# Patient Record
Sex: Female | Born: 1945 | Race: White | Hispanic: No | Marital: Married | Smoking: Never smoker
Health system: Southern US, Community
[De-identification: ages and names within clinical notes are randomized; demographics above are authoritative.]

## PROBLEM LIST (undated history)

## (undated) DIAGNOSIS — K529 Noninfective gastroenteritis and colitis, unspecified: Secondary | ICD-10-CM

## (undated) DIAGNOSIS — I499 Cardiac arrhythmia, unspecified: Secondary | ICD-10-CM

## (undated) DIAGNOSIS — M1990 Unspecified osteoarthritis, unspecified site: Secondary | ICD-10-CM

## (undated) DIAGNOSIS — K219 Gastro-esophageal reflux disease without esophagitis: Secondary | ICD-10-CM

## (undated) DIAGNOSIS — K5010 Crohn's disease of large intestine without complications: Secondary | ICD-10-CM

## (undated) DIAGNOSIS — I456 Pre-excitation syndrome: Secondary | ICD-10-CM

## (undated) HISTORY — PX: INGUINAL HERNIA REPAIR: SUR1180

## (undated) HISTORY — DX: Crohn's disease of large intestine without complications: K50.10

## (undated) HISTORY — PX: TUBAL LIGATION: SHX77

## (undated) HISTORY — DX: Pre-excitation syndrome: I45.6

---
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
## 1995-02-22 HISTORY — PX: BREAST BIOPSY: SHX20

## 1997-10-27 ENCOUNTER — Encounter: Admission: RE | Admit: 1997-10-27 | Discharge: 1997-10-27 | Payer: Self-pay | Admitting: Family Medicine

## 1998-04-09 ENCOUNTER — Encounter: Admission: RE | Admit: 1998-04-09 | Discharge: 1998-04-09 | Payer: Self-pay | Admitting: Sports Medicine

## 1998-06-11 ENCOUNTER — Encounter: Admission: RE | Admit: 1998-06-11 | Discharge: 1998-06-11 | Payer: Self-pay | Admitting: Sports Medicine

## 1998-07-06 ENCOUNTER — Encounter: Admission: RE | Admit: 1998-07-06 | Discharge: 1998-07-06 | Payer: Self-pay | Admitting: Sports Medicine

## 1998-11-02 ENCOUNTER — Encounter: Admission: RE | Admit: 1998-11-02 | Discharge: 1998-11-02 | Payer: Self-pay | Admitting: Family Medicine

## 1998-12-24 ENCOUNTER — Encounter: Admission: RE | Admit: 1998-12-24 | Discharge: 1998-12-24 | Payer: Self-pay | Admitting: Family Medicine

## 1999-03-07 ENCOUNTER — Other Ambulatory Visit: Admission: RE | Admit: 1999-03-07 | Discharge: 1999-03-07 | Payer: Self-pay | Admitting: Obstetrics and Gynecology

## 1999-10-16 ENCOUNTER — Ambulatory Visit (HOSPITAL_COMMUNITY): Admission: RE | Admit: 1999-10-16 | Discharge: 1999-10-16 | Payer: Self-pay | Admitting: Gastroenterology

## 2000-04-02 ENCOUNTER — Other Ambulatory Visit: Admission: RE | Admit: 2000-04-02 | Discharge: 2000-04-02 | Payer: Self-pay | Admitting: Obstetrics and Gynecology

## 2001-01-22 ENCOUNTER — Encounter: Admission: RE | Admit: 2001-01-22 | Discharge: 2001-01-22 | Payer: Self-pay | Admitting: Sports Medicine

## 2001-04-13 ENCOUNTER — Other Ambulatory Visit: Admission: RE | Admit: 2001-04-13 | Discharge: 2001-04-13 | Payer: Self-pay | Admitting: Obstetrics and Gynecology

## 2002-02-24 ENCOUNTER — Encounter: Admission: RE | Admit: 2002-02-24 | Discharge: 2002-02-24 | Payer: Self-pay | Admitting: Sports Medicine

## 2002-02-24 ENCOUNTER — Ambulatory Visit (HOSPITAL_COMMUNITY): Admission: RE | Admit: 2002-02-24 | Discharge: 2002-02-24 | Payer: Self-pay | Admitting: Sports Medicine

## 2003-05-21 ENCOUNTER — Emergency Department (HOSPITAL_COMMUNITY): Admission: AD | Admit: 2003-05-21 | Discharge: 2003-05-21 | Payer: Self-pay | Admitting: Family Medicine

## 2003-05-31 ENCOUNTER — Encounter: Admission: RE | Admit: 2003-05-31 | Discharge: 2003-05-31 | Payer: Self-pay | Admitting: Obstetrics and Gynecology

## 2003-10-06 ENCOUNTER — Encounter (INDEPENDENT_AMBULATORY_CARE_PROVIDER_SITE_OTHER): Payer: Self-pay | Admitting: *Deleted

## 2003-10-06 ENCOUNTER — Ambulatory Visit (HOSPITAL_COMMUNITY): Admission: RE | Admit: 2003-10-06 | Discharge: 2003-10-06 | Payer: Self-pay | Admitting: Gastroenterology

## 2004-05-15 ENCOUNTER — Encounter (INDEPENDENT_AMBULATORY_CARE_PROVIDER_SITE_OTHER): Payer: Self-pay | Admitting: Specialist

## 2004-05-15 ENCOUNTER — Ambulatory Visit (HOSPITAL_COMMUNITY): Admission: RE | Admit: 2004-05-15 | Discharge: 2004-05-15 | Payer: Self-pay | Admitting: Obstetrics and Gynecology

## 2004-05-15 ENCOUNTER — Ambulatory Visit (HOSPITAL_BASED_OUTPATIENT_CLINIC_OR_DEPARTMENT_OTHER): Admission: RE | Admit: 2004-05-15 | Discharge: 2004-05-15 | Payer: Self-pay | Admitting: Obstetrics and Gynecology

## 2004-11-22 ENCOUNTER — Ambulatory Visit: Payer: Self-pay | Admitting: Sports Medicine

## 2004-11-22 ENCOUNTER — Encounter: Admission: RE | Admit: 2004-11-22 | Discharge: 2004-11-22 | Payer: Self-pay | Admitting: Sports Medicine

## 2005-04-25 ENCOUNTER — Encounter: Admission: RE | Admit: 2005-04-25 | Discharge: 2005-04-25 | Payer: Self-pay | Admitting: Obstetrics and Gynecology

## 2005-05-06 ENCOUNTER — Ambulatory Visit: Payer: Self-pay | Admitting: Sports Medicine

## 2005-06-04 ENCOUNTER — Ambulatory Visit: Payer: Self-pay | Admitting: Internal Medicine

## 2005-06-18 ENCOUNTER — Ambulatory Visit: Payer: Self-pay

## 2005-06-18 ENCOUNTER — Encounter: Payer: Self-pay | Admitting: Cardiology

## 2006-02-03 ENCOUNTER — Ambulatory Visit: Payer: Self-pay | Admitting: Sports Medicine

## 2006-09-08 ENCOUNTER — Telehealth (INDEPENDENT_AMBULATORY_CARE_PROVIDER_SITE_OTHER): Payer: Self-pay | Admitting: *Deleted

## 2006-09-22 LAB — HM COLONOSCOPY: HM Colonoscopy: NORMAL

## 2006-11-02 ENCOUNTER — Encounter: Admission: RE | Admit: 2006-11-02 | Discharge: 2006-11-02 | Payer: Self-pay | Admitting: Obstetrics and Gynecology

## 2006-12-16 ENCOUNTER — Encounter: Payer: Self-pay | Admitting: Family Medicine

## 2007-02-15 ENCOUNTER — Encounter (INDEPENDENT_AMBULATORY_CARE_PROVIDER_SITE_OTHER): Payer: Self-pay | Admitting: *Deleted

## 2007-02-15 ENCOUNTER — Ambulatory Visit: Payer: Self-pay | Admitting: Internal Medicine

## 2007-06-23 LAB — HM MAMMOGRAPHY: HM Mammogram: NORMAL

## 2008-01-19 ENCOUNTER — Encounter: Admission: RE | Admit: 2008-01-19 | Discharge: 2008-01-19 | Payer: Self-pay | Admitting: Obstetrics and Gynecology

## 2008-01-19 ENCOUNTER — Ambulatory Visit: Payer: Self-pay | Admitting: Internal Medicine

## 2008-11-22 ENCOUNTER — Telehealth: Payer: Self-pay | Admitting: Internal Medicine

## 2009-01-17 ENCOUNTER — Ambulatory Visit: Payer: Self-pay | Admitting: Internal Medicine

## 2009-01-17 DIAGNOSIS — R9431 Abnormal electrocardiogram [ECG] [EKG]: Secondary | ICD-10-CM | POA: Insufficient documentation

## 2009-01-17 DIAGNOSIS — I456 Pre-excitation syndrome: Secondary | ICD-10-CM | POA: Insufficient documentation

## 2009-01-26 ENCOUNTER — Encounter: Payer: Self-pay | Admitting: Internal Medicine

## 2009-01-29 ENCOUNTER — Ambulatory Visit: Payer: Self-pay

## 2009-01-29 ENCOUNTER — Encounter: Payer: Self-pay | Admitting: Internal Medicine

## 2009-01-29 ENCOUNTER — Ambulatory Visit (HOSPITAL_COMMUNITY): Admission: RE | Admit: 2009-01-29 | Discharge: 2009-01-29 | Payer: Self-pay | Admitting: Internal Medicine

## 2009-01-29 ENCOUNTER — Ambulatory Visit: Payer: Self-pay | Admitting: Cardiology

## 2010-01-21 ENCOUNTER — Ambulatory Visit: Payer: Self-pay | Admitting: Internal Medicine

## 2010-02-20 ENCOUNTER — Ambulatory Visit: Payer: Self-pay | Admitting: Family Medicine

## 2010-02-21 LAB — CONVERTED CEMR LAB
ALT: 16 units/L (ref 0–35)
AST: 22 units/L (ref 0–37)
Albumin: 4.1 g/dL (ref 3.5–5.2)
BUN: 19 mg/dL (ref 6–23)
Bilirubin, Direct: 0.1 mg/dL (ref 0.0–0.3)
CO2: 29 meq/L (ref 19–32)
Chloride: 107 meq/L (ref 96–112)
Creatinine, Ser: 0.8 mg/dL (ref 0.4–1.2)
Eosinophils Relative: 1.7 % (ref 0.0–5.0)
Glucose, Bld: 83 mg/dL (ref 70–99)
HCT: 41.2 % (ref 36.0–46.0)
Lymphocytes Relative: 28.3 % (ref 12.0–46.0)
MCHC: 33.7 g/dL (ref 30.0–36.0)
Monocytes Relative: 8.4 % (ref 3.0–12.0)
Platelets: 218 10*3/uL (ref 150.0–400.0)
Potassium: 4.9 meq/L (ref 3.5–5.1)
RDW: 15.1 % — ABNORMAL HIGH (ref 11.5–14.6)
Sodium: 142 meq/L (ref 135–145)
Total Bilirubin: 0.6 mg/dL (ref 0.3–1.2)
Total CHOL/HDL Ratio: 3
Triglycerides: 61 mg/dL (ref 0.0–149.0)

## 2010-03-01 ENCOUNTER — Telehealth: Payer: Self-pay | Admitting: Family Medicine

## 2010-03-26 ENCOUNTER — Telehealth: Payer: Self-pay | Admitting: Family Medicine

## 2010-04-22 ENCOUNTER — Ambulatory Visit
Admission: RE | Admit: 2010-04-22 | Discharge: 2010-04-22 | Payer: Self-pay | Source: Home / Self Care | Attending: Sports Medicine | Admitting: Sports Medicine

## 2010-04-22 DIAGNOSIS — Z8739 Personal history of other diseases of the musculoskeletal system and connective tissue: Secondary | ICD-10-CM | POA: Insufficient documentation

## 2010-04-22 DIAGNOSIS — S83006A Unspecified dislocation of unspecified patella, initial encounter: Secondary | ICD-10-CM | POA: Insufficient documentation

## 2010-04-22 DIAGNOSIS — M25569 Pain in unspecified knee: Secondary | ICD-10-CM | POA: Insufficient documentation

## 2010-04-23 NOTE — Assessment & Plan Note (Signed)
Summary: PER [facility] OUT/SF   Visit Type:  Follow-up  CC:  No cardiac complaints.  History of Present Illness: Caitlin Hicks is seen in followup for WPW.  She has had 2 episodes of tachycardia over the last year.One lasted about 4 hours and the other for 15 minutes  Current Medications (verified): 1)  Lialda 1.2 Gm Tbec (Mesalamine) .... 2 Tablets Daily 2)  Asacol 400 Mg Tbec (Mesalamine) .... Take 1 Tablet By Mouth Once A Day 3)  Calcium Carbonate-Vitamin D 600-400 Mg-Unit  Tabs (Calcium Carbonate-Vitamin D) .... About 500mg  A Day 4)  Vitamin D 1000 Unit Tabs (Cholecalciferol) .... Take 1 Tablet By Mouth Once A Day 5)  Multivitamins   Tabs (Multiple Vitamin) .... Sometimes 6)  Pro-Biotic Blend  Caps (Probiotic Product) .... Once Daily 7)  Propranolol Hcl 20 Mg Tabs (Propranolol Hcl) .... Take One Tablet As Needed  Allergies: 1)  ! Sulfa  Past History:  Past Medical History: Last updated: 05/21/2006 ERT 1999 to 2002, hx of segmental colitis, right inguinal hearnia  Vital Signs:  Patient profile:   65 year old female Height:      60 inches Weight:      107.50 pounds BMI:     21.07 Pulse rate:   59 / minute Pulse rhythm:   regular Resp:     18 per minute BP sitting:   140 / 70  (left arm) Cuff size:   regular  Vitals Entered By: Vikki Ports (January 21, 2010 4:21 PM)  Physical Exam  General:  The patient was alert and oriented in no acute distress. HEENT Normal.  Neck veins were flat, carotids were brisk.  Lungs were clear.  Heart sounds were regular without murmurs or gallops.  Abdomen was soft with active bowel sounds. There is no clubbing cyanosis or edema. Skin Warm and dry    EKG  Procedure date:  01/21/2010  Findings:      sinus rhtyhm with preexcitation  Impression & Recommendations:  Problem # 1:  WOLFF (WOLFE)-PARKINSON-WHITE (WPW) SYNDROME (ICD-426.7)  will refill indral and have reviewed carotid massage.   Her updated medication list for this  problem includes:    Propranolol Hcl 20 Mg Tabs (Propranolol hcl) .Marland Kitchen... Take one tablet as needed    Propranolol Hcl 20 Mg Tabs (Propranolol hcl) .Marland Kitchen... Prn  Her updated medication list for this problem includes:    Propranolol Hcl 20 Mg Tabs (Propranolol hcl) .Marland Kitchen... Take one tablet as needed    Propranolol Hcl 20 Mg Tabs (Propranolol hcl) .Marland Kitchen... Prn  Patient Instructions: 1)  Your physician has recommended you make the following change in your medication: Propranolol 20mg  as needed.  2)  Your physician recommends that you schedule a follow-up appointment in: As needed. Prescriptions: PROPRANOLOL HCL 20 MG TABS (PROPRANOLOL HCL) prn  #10 x 0   Entered by:   Claris Gladden RN   Authorized by:   Nathen May, MD, [facility]   Signed by:   Claris Gladden RN on 01/21/2010   Method used:   Print then Give to Patient   RxID:   [id number]

## 2010-04-23 NOTE — Assessment & Plan Note (Signed)
Summary: BRAND NEW PT/TO EST/PT REQ CPX/PT COMING IN FASTING/CJR   Vital Signs:  Patient profile:   65 year old female Menstrual status:  postmenopausal Height:      59.50 inches Weight:      106 pounds Temp:     98.5 degrees F oral Pulse rate:   72 / minute Pulse rhythm:   regular Resp:     12 per minute BP sitting:   108 / 78  (left arm) Cuff size:   regular  Vitals Entered By: Sid Falcon LPN (February 20, 2010 9:02 AM)     Menstrual Status postmenopausal Last PAP Result normal   History of Present Illness: New patient to establish care.  She sees gynecologist yearly. Past medical history significant for Wolff-Parkinson-White syndrome, osteoporosis, and ulcerative colitis. She sees gastroenterologist regularly. Prior history inguinal hernia repair.  family history significant for father died age 50. Mother died age 80 of Alzheimer's dementia complications. Patient is nonsmoker. Occasional alcohol use. Masters in nursing.  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  Preventive Screening-Counseling & Management  Alcohol-Tobacco     Smoking Status: never  Allergies: 1)  ! 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[employer]; married; non smoker; 3 - 4 drinks wine per week  Risk Factors: Smoking Status: never (02/20/2010)  Past Medical History: ERT 1999 to 2002, hx of segmental colitis, right inguinal hearnia 1980 Osteoporosis Ulcerative colitis WPW PMH reviewed for relevance  Family History: 2 sons, father 42  prostate cancer?, mother 55 with some osteoporosis Mother lived to 26 Father lived to  35  Social History: Smoking Status:  never  Review of Systems  The patient denies anorexia, fever, weight loss, weight gain, vision loss, decreased hearing, hoarseness, chest pain, syncope, dyspnea on exertion, peripheral edema, prolonged cough, headaches, hemoptysis, abdominal pain, melena, hematochezia, severe indigestion/heartburn, hematuria, incontinence, genital sores, muscle weakness, suspicious skin lesions, transient blindness, difficulty walking, depression, unusual weight change, abnormal bleeding, enlarged lymph nodes, and breast masses.    Physical Exam  General:  Well-developed,well-nourished,in no acute distress; alert,appropriate and cooperative throughout examination Head:  Normocephalic and atraumatic without obvious abnormalities. No apparent alopecia or balding. Eyes:  pupils equal, pupils round, and pupils reactive to light.   Ears:  External ear exam shows no significant lesions or deformities.  Otoscopic examination reveals clear canals, tympanic membranes are intact bilaterally without bulging, retraction, inflammation or discharge. Hearing is grossly normal bilaterally. Mouth:  Oral mucosa and oropharynx without lesions or exudates.  Teeth in good repair. Neck:  No deformities, masses, or tenderness noted. Breasts:  gyn Lungs:  Normal respiratory effort, chest expands symmetrically. Lungs are clear to auscultation, no crackles or wheezes. Heart:  Normal rate and regular rhythm. S1 and S2 normal without gallop, murmur, click, rub or other extra sounds. Abdomen:  Bowel sounds positive,abdomen soft and non-tender without masses, organomegaly or hernias noted. Genitalia:  gyn Extremities:  No clubbing, cyanosis, edema, or deformity noted with normal full range of motion of all joints.   Neurologic:  alert & oriented X3 and cranial nerves II-XII intact.   Skin:  no rashes and no suspicious lesions.   Cervical Nodes:  No lymphadenopathy  noted Psych:  normally interactive,  good eye contact, not anxious appearing, and not depressed appearing.     Impression & Recommendations:  Problem # 1:  Preventive Health Care (ICD-V70.0) cont healthy lifestyle habits.  She will cont with gyn f/u.  Check screening labs. She will verify dates of prior immunizations.  Complete Medication List: 1)  Lialda 1.2 Gm Tbec (Mesalamine) .... 2 tablets daily 2)  Asacol 400 Mg Tbec (Mesalamine) .... Take 1 tablet by mouth once a day 3)  Calcium Carbonate-vitamin D 600-400 Mg-unit Tabs (Calcium carbonate-vitamin d) .... About 500mg  a day 4)  Vitamin D 1000 Unit Tabs (Cholecalciferol) .... Take 1 tablet by mouth once a day 5)  Multivitamins Tabs (Multiple vitamin) .... Sometimes 6)  Pro-biotic Blend Caps (Probiotic product) .... Once daily 7)  Propranolol Hcl 20 Mg Tabs (Propranolol hcl) .... Take one tablet as needed 8)  Propranolol Hcl 20 Mg Tabs (Propranolol hcl) .... Prn  Other Orders: Specimen Handling (95621) Venipuncture (30865) TLB-Lipid Panel (80061-LIPID) TLB-BMP (Basic Metabolic Panel-BMET) (80048-METABOL) TLB-CBC Platelet - w/Differential (85025-CBCD) TLB-Hepatic/Liver Function Pnl (80076-HEPATIC)  Patient Instructions: 1)  Please schedule a follow-up appointment as needed .    Orders Added: 1)  Specimen Handling [99000] 2)  Venipuncture [36415] 3)  TLB-Lipid Panel [80061-LIPID] 4)  TLB-BMP (Basic Metabolic Panel-BMET) [80048-METABOL] 5)  TLB-CBC Platelet - w/Differential [85025-CBCD] 6)  TLB-Hepatic/Liver Function Pnl [80076-HEPATIC] 7)  New Patient 65&> [78469]    Preventive Care Screening  Last Flu Shot:    Date:  12/22/2009    Results:  given   Bone Density:    Date:  03/24/2009    Results:  abnormal std dev  Pap Smear:    Date:  09/22/2007    Results:  normal   Mammogram:    Date:  06/23/2007    Results:  normal   Colonoscopy:    Date:  09/22/2006    Results:  normal

## 2010-04-23 NOTE — Progress Notes (Signed)
  Phone Note Call from Patient   Caller: Patient Call For: Caitlin Peat MD Summary of Call: Would like a copy of labs.  Labs copied. Initial call taken by: [facility] CMA AAMA,  March 01, 2010 3:27 PM

## 2010-04-25 NOTE — Progress Notes (Signed)
Summary: Pt requesting labs be mailed to home  Phone Note Call from Patient   Caller: Patient Call For: Evelena Peat MD Summary of Call: Pt requesting copy of labs, labs mailed to home Initial call taken by: Sid Falcon LPN,  March 26, 2010 3:07 PM

## 2010-05-01 NOTE — Assessment & Plan Note (Signed)
Summary: RT KNEE PAIN,MC   Vital Signs:  Patient profile:   65 year old female Menstrual status:  postmenopausal Height:      60 inches Weight:      105 pounds Pulse rate:   66 / minute BP sitting:   120 / 77  (left arm)  Vitals Entered By: Rochele Pages RN (April 22, 2010 11:54 AM) CC: rt knee pain   CC:  rt knee pain.  History of Present Illness: hx of psuedo gout- had a flare consistent with that on dec 31st, which is much better but not completely resolved.  joint got hot and swollen.   took tylenol for it which helps a little.   has a flare q4-6 months which usually resolves over 2-3 weeks.  no injury.   still having pain worse at night.  long hx of PFS now unable to do a quad extension on RT w bent knee  has walked and run for years now walking 3 mpd x when knee painful  Preventive Screening-Counseling & Management  Alcohol-Tobacco     Smoking Status: never  Current Medications (verified): 1)  Lialda 1.2 Gm Tbec (Mesalamine) .... 2 Tablets Daily 2)  Asacol 400 Mg Tbec (Mesalamine) .... Take 1 Tablet By Mouth Once A Day 3)  Calcium Carbonate-Vitamin D 600-400 Mg-Unit  Tabs (Calcium Carbonate-Vitamin D) .... About 500mg  A Day 4)  Vitamin D 1000 Unit Tabs (Cholecalciferol) .... Take 1 Tablet By Mouth Once A Day 5)  Multivitamins   Tabs (Multiple Vitamin) .... Sometimes 6)  Pro-Biotic Blend  Caps (Probiotic Product) .... Once Daily 7)  Propranolol Hcl 20 Mg Tabs (Propranolol Hcl) .... Take One Tablet As Needed 8)  Propranolol Hcl 20 Mg Tabs (Propranolol Hcl) .... Prn 9)  Diclofenac Sodium 50 Mg Tbec (Diclofenac Sodium) .... Take One Two Times A Day For 3-5 Days As Needed Pseudogout Flair  Allergies (verified): 1)  ! Sulfa  Review of Systems  The patient denies anorexia and fever.    Physical Exam  General:  Well-developed,well-nourished,in no acute distress; alert,appropriate and cooperative throughout examination Msk:  patellar subluxation R>L with patella  completely out of groove on R unable to fully straighten R leg - lacks about 3 to 5 deg of full ext whereas on left has about 2 deg ext lag\par unable to do leg extension on RT when in seated position with knee bent to 90 deg can do SLR from hip but this is difficult in upright position can do SLR but cannot do bent knee extension  Additional Exam:  MSK [place] she has bilat DJD changes along joint lines meniscus med and lat are calcified PT is intact prox distally there is some gapping in central pat tendon insertion into tib tub edema noted at this area no neovessels noted   Impression & Recommendations:  Problem # 1:  PATELLAR DISLOCATION, 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  some splitting of quad tendon due to patella completely out of groove.  will try to have pt due quad strengthening.  use ice and knee sleeve to try to get patella closer to position.  pt not interested in surgical options.  will lookd ot see if we can find a lat stabilizing brace that is comfortable  Don joy in office is painful  reck in 2 mos after home ex program  Problem # 2:  PSEUDOGOUT (ICD-275.49) Assessment: Unchanged  will give diclofenac 50 two times a day 3-5 days for flairs.  use ice with an y  knee irritation.  consider colchicine if flairs become mmore common.  likely due to irritation from lateral tracking of patella  Orders: [place] LIMITED (27253)  Problem # 3:  KNEE PAIN, RIGHT (ICD-719.46)  Her updated medication list for this problem includes:    Diclofenac Sodium 50 Mg Tbec (Diclofenac sodium) .Marland Kitchen... Take one two times a day for 3-5 days as needed pseudogout flair  Orders: [place] LIMITED (66440)   primary cause for this at present does not look like CPPD but prob her degree of patellar dysfunction and subluxation on RT  Complete Medication List: 1)  Lialda 1.2 Gm Tbec (Mesalamine) .... 2 tablets daily 2)  Asacol 400 Mg Tbec (Mesalamine) .... Take 1 tablet by mouth once a day 3)  Calcium  Carbonate-vitamin D 600-400 Mg-unit Tabs (Calcium carbonate-vitamin d) .... About 500mg  a day 4)  Vitamin D 1000 Unit Tabs (Cholecalciferol) .... Take 1 tablet by mouth once a day 5)  Multivitamins Tabs (Multiple vitamin) .... Sometimes 6)  Pro-biotic Blend Caps (Probiotic product) .... Once daily 7)  Propranolol Hcl 20 Mg Tabs (Propranolol hcl) .... Take one tablet as needed 8)  Propranolol Hcl 20 Mg Tabs (Propranolol hcl) .... Prn 9)  Diclofenac Sodium 50 Mg Tbec (Diclofenac sodium) .... Take one two times a day for 3-5 days as needed pseudogout flair Prescriptions: DICLOFENAC SODIUM 50 MG TBEC (DICLOFENAC SODIUM) take one two times a day for 3-5 days as needed pseudogout flair  #30 x 3   Entered by:   Ellery Plunk MD   Authorized by:   Enid Baas MD   Signed by:   Ellery Plunk MD on 04/22/2010   Method used:   Electronically to        [facility]* (retail)       [address]       [place], [place]  [id number]       Ph: [phone]       Fax: [phone]   RxID:   [id number]    Orders Added: 1)  Est. Patient Level IV [20254] 2)  [place] LIMITED [27062]

## 2010-06-12 ENCOUNTER — Ambulatory Visit: Payer: Self-pay | Admitting: Sports Medicine

## 2010-08-06 NOTE — Letter (Signed)
February 15, 2007    Roanna Epley, M.D.  [address]  [place], [place]  [address]   RE:  Caitlin, Hicks  MRN:  [id number]  /  DOB:  March 10, 1946   Dear [name]:   The patient comes in today and had three episodes of atrial fibrillation  in the last year.  The most recent one lasted less than two hours.  She  actually called Gerrit Friends. Dietrich Pates, MD, [facility], apparently got him at home  because a friend of hers knew him.  He suggested cold water immersion.  This did not work.  She comes in today with a multitude of questions.   She continues to take her nutraceuticals as well as Fosamax.   PHYSICAL EXAMINATION:  VITAL SIGNS:  Blood pressure 117/70, pulse 74.  LUNGS:  Clear.  HEART:  Sounds were regular.  EXTREMITIES:  Without edema.   IMPRESSION:  Wolff-Parkinson-White.  We discussed treatment strategies.  She would like to defer catheter ablation.  I told her that from my  perspective, it is a lifestyle choice that is to stay as there are no  data that we can decrease from mortality the absence of rapidly  conducting atrial fibrillation, that her choice to proceed with catheter  ablation would be driven by her  symptoms.  At this point they are not all that bothersome to her.  She  did ask if there was a p.r.n. medication.  I told her that we could use  Inderal and I prescribed her Inderal 10 to be taken as needed with a  second 10 mg tablet to be taken 20 minutes later.  We will see her again  in one years' time.    Sincerely,      Duke Salvia, MD, [facility]  Electronically Signed    SCK/MedQ  DD: 02/15/2007  DT: 02/15/2007  Job #: [id number]

## 2010-08-06 NOTE — Assessment & Plan Note (Signed)
[facility]                         ELECTROPHYSIOLOGY OFFICE NOTE   NAME:Mcever, CYLEIGH MASSARO                       MRN:          [id number]  DATE:01/19/2008                            DOB:          07/31/1945    Ms. Vanderhoof is seen in followup for WPW.  She has had two episodes of  tachycardia in the last year, one lasted a minute, one lasted a couple  of hours.  Her p.r.n. Inderal was not available to her for the latter  and so she just kept on hiking with her son out in  [place].   Her ulcers colitis is a little bit more active of late.   PHYSICAL EXAMINATION:  VITAL SIGNS:  Her blood pressure is 102/70.  Her  pulse was 64.  LUNGS:  Clear.  CARDIAC:  Heart sounds were regular with a quiet 1-2/6 systolic murmur  heard along left sternal border.   Electrocardiogram dated today demonstrated a sinus rhythm at 64 with  intervals at 0.12/0.09/0.42.  There was evidence of ventricular pre-  excitation with biatrial enlargement which has been evolving over the  last 6 years.   IMPRESSION:  1. Wolff-Parkinson-White syndrome.  2. Evolving biatrial enlargement.   The patient has no symptoms related to LV dysfunction, so I am not sure  that the yield of an echo at this point makes this.  I should note that  an echo done in 2007 four years after the first evidence of right atrial  enlargement was described as having normal right atrial size.   She is to let us know if she has any symptoms.  We will plan to see her  again one year's time.     Duke Salvia, MD, [facility]  Electronically Signed    SCK/MedQ  DD: 01/19/2008  DT: 01/20/2008  Job #: [id number]   cc:   Tasia Catchings, M.D.

## 2010-08-09 NOTE — Op Note (Signed)
NAMEDANIALLE, Caitlin Hicks                ACCOUNT NO.:  [id number]   MEDICAL RECORD NO.:  [id number]          PATIENT TYPE:  AMB   LOCATION:  [facility]                         FACILITY:  [facility]   PHYSICIAN:  Sherry A. Dickstein, M.D.DATE OF BIRTH:  06-11-1945   DATE OF PROCEDURE:  05/15/2004  DATE OF DISCHARGE:                                 OPERATIVE REPORT   PREOPERATIVE DIAGNOSES:  1.  Postmenopausal bleeding.  2.  Endometrial polyp.   POSTOPERATIVE DIAGNOSES:  1.  Postmenopausal bleeding.  2.  Endometrial polyp.   PROCEDURE:  D&C, hysteroscopy with resectoscope.   SURGEON:  Sherry A. Rosalio Macadamia, M.D.   ANESTHESIA:  MAC.   INDICATIONS FOR PROCEDURE:  This is a 65 year old G2, P2-0-0-2 woman who had  an episode of postmenopausal bleeding.  Ultrasound was performed which  revealed an endometrial polyp present.  Because of the presence of the  polyp, the patient is brought to the operating room for [facility] hysteroscopy  with removal of the polyp.   FINDINGS:  Small anteflexed uterus with small endometrial polyp present in  the endometrial cavity.  No adnexal mass.   DESCRIPTION OF PROCEDURE:  The patient was brought into the operating room  and given adequate IV sedation.  She was placed in the dorsal lithotomy  position.  Her perineum was washed with Hibiclens.  Pelvic examination was  performed.  The patient was draped in sterile fashion.  Speculum placed  within the vagina.  Vagina was washed with Hibiclens.  Paracervical block  was administered 1% Nesacaine.  Anterior lip of the cervix was grasped with  a single-tooth tenaculum.  Cervix was sounded.  Cervix was dilated with  Pratt dilators to a #31.  Hysteroscope introduced into the endometrial  cavity.  Pictures were obtained.  The polyp was identified on the posterior  wall.  Polyp was resected with a double loop resector without difficulty and  endometrial resections were taken circumferentially superficially.  Pictures  were  obtained.  Adequate hemostasis was present.  All instruments were  removed from the vagina.  The patient was taken out of the dorsal lithotomy  position.  She was awakened.  She was moved from the operating table to a  stretcher in stable condition.   COMPLICATIONS:  None.   ESTIMATED BLOOD LOSS:  Less than 5 mL.   SORBITOL DIFFERENTIAL:  -65 mL.   SPECIMENS:  1.  Endometrial polyp.  2.  Endometrial resection.      SAD/MEDQ  D:  05/15/2004  T:  05/15/2004  Job:  [id number]

## 2011-04-03 ENCOUNTER — Other Ambulatory Visit (INDEPENDENT_AMBULATORY_CARE_PROVIDER_SITE_OTHER): Payer: BC Managed Care – PPO

## 2011-04-03 DIAGNOSIS — Z0000 Encounter for general adult medical examination without abnormal findings: Secondary | ICD-10-CM

## 2011-04-03 LAB — CBC WITH DIFFERENTIAL/PLATELET
Basophils Absolute: 0 10*3/uL (ref 0.0–0.1)
Basophils Relative: 0.7 % (ref 0.0–3.0)
Eosinophils Absolute: 0.1 10*3/uL (ref 0.0–0.7)
Eosinophils Relative: 2.8 % (ref 0.0–5.0)
HCT: 40 % (ref 36.0–46.0)
Hemoglobin: 13.6 g/dL (ref 12.0–15.0)
Lymphs Abs: 1.5 10*3/uL (ref 0.7–4.0)
MCV: 87.7 fl (ref 78.0–100.0)
Monocytes Absolute: 0.4 10*3/uL (ref 0.1–1.0)
Monocytes Relative: 8.6 % (ref 3.0–12.0)
Platelets: 241 10*3/uL (ref 150.0–400.0)
RBC: 4.57 Mil/uL (ref 3.87–5.11)
RDW: 14.7 % — ABNORMAL HIGH (ref 11.5–14.6)
WBC: 4.9 10*3/uL (ref 4.5–10.5)

## 2011-04-03 LAB — LIPID PANEL
HDL: 54.6 mg/dL (ref >39.00)
LDL Cholesterol: 124 mg/dL — ABNORMAL HIGH (ref 0–99)
Total CHOL/HDL Ratio: 4
Triglycerides: 104 mg/dL (ref 0.0–149.0)

## 2011-04-03 LAB — BASIC METABOLIC PANEL
Creatinine, Ser: 0.9 mg/dL (ref 0.4–1.2)
Glucose, Bld: 87 mg/dL (ref 70–99)
Sodium: 140 mEq/L (ref 135–145)

## 2011-04-03 LAB — POCT URINALYSIS DIPSTICK
Leukocytes, UA: NEGATIVE
Urobilinogen, UA: 0.2

## 2011-04-03 LAB — HEPATIC FUNCTION PANEL
AST: 20 U/L (ref 0–37)
Total Bilirubin: 0.7 mg/dL (ref 0.3–1.2)

## 2011-04-03 LAB — TSH: TSH: 4.66 u[IU]/mL (ref 0.35–5.50)

## 2011-04-09 ENCOUNTER — Encounter: Payer: Self-pay | Admitting: Family Medicine

## 2011-04-10 ENCOUNTER — Encounter: Payer: Self-pay | Admitting: Family Medicine

## 2011-04-10 ENCOUNTER — Ambulatory Visit (INDEPENDENT_AMBULATORY_CARE_PROVIDER_SITE_OTHER): Payer: BC Managed Care – PPO | Admitting: Family Medicine

## 2011-04-10 VITALS — BP 122/78 | HR 60 | Temp 98.3°F | Resp 12 | Ht 59.5 in | Wt 108.0 lb

## 2011-04-10 DIAGNOSIS — K5190 Ulcerative colitis, unspecified, without complications: Secondary | ICD-10-CM | POA: Insufficient documentation

## 2011-04-10 DIAGNOSIS — Z0000 Encounter for general adult medical examination without abnormal findings: Secondary | ICD-10-CM

## 2011-04-10 NOTE — Patient Instructions (Signed)
Schedule repeat colonoscopy later this year. Continue regular weight bearing exercise and calcium and Vit D supplementation. Check on status of pneumovax and Tdap

## 2011-04-10 NOTE — Progress Notes (Signed)
  Subjective:    Patient ID: Caitlin Hicks, female    DOB: 1945-07-10, 66 y.o.   MRN: [id number]  HPI  Patient here for complete physical examination. She has no specific chronic medical problems. History of pseudogout. She also has history of ulcerative colitis and is weaning down to very low dose of Asacol. She's been followed by gastroenterology and is due for repeat colonoscopy this year. She thinks she had Pneumovax 2 years ago but is not sure. Unknown last tetanus. Take regular calcium and vitamin D. Regular weightbearing exercise. History of shingles vaccine. She has refused regular mammograms in the past. Not interested in Pap smear this time. No history of abnormal Pap smear  Past Medical History  Diagnosis Date  . Segmental colitis   . Right inguinal hernia 1980  . Osteoporosis   . Ulcerative colitis   . WPW (Wolff-Parkinson-White syndrome)    Past Surgical History  Procedure Date  . Breast biopsy 02/22/1995    rt  . Hernia repair     02/24/2002    reports that she has never smoked. She does not have any smokeless tobacco history on file. She reports that she drinks alcohol. Her drug history not on file. family history includes Osteoporosis in her mother and Prostate cancer in an unspecified family member. Allergies  Allergen Reactions  . Sulfonamide Derivatives        Review of Systems  Constitutional: Negative for fever, activity change, appetite change, fatigue and unexpected weight change.  HENT: Negative for hearing loss, ear pain, sore throat and trouble swallowing.   Eyes: Negative for visual disturbance.  Respiratory: Negative for cough and shortness of breath.   Cardiovascular: Negative for chest pain and palpitations.  Gastrointestinal: Negative for abdominal pain, diarrhea, constipation and blood in stool.  Genitourinary: Negative for dysuria and hematuria.  Musculoskeletal: Negative for myalgias, back pain and arthralgias.  Skin: Negative for rash.    Neurological: Negative for dizziness, syncope and headaches.  Hematological: Negative for adenopathy.  Psychiatric/Behavioral: Negative for confusion and dysphoric mood.       Objective:   Physical Exam  Constitutional: She is oriented to person, place, and time. She appears well-developed and well-nourished.  HENT:  Head: Normocephalic and atraumatic.  Eyes: EOM are normal. Pupils are equal, round, and reactive to light.  Neck: Normal range of motion. Neck supple. No thyromegaly present.  Cardiovascular: Normal rate, regular rhythm and normal heart sounds.   No murmur heard. Pulmonary/Chest: Breath sounds normal. No respiratory distress. She has no wheezes. She has no rales.       Breasts are symmetrical, masses. Scar right lateral breast from remote biopsy  Abdominal: Soft. Bowel sounds are normal. She exhibits no distension and no mass. There is no tenderness. There is no rebound and no guarding.  Musculoskeletal: Normal range of motion. She exhibits no edema.  Lymphadenopathy:    She has no cervical adenopathy.  Neurological: She is alert and oriented to person, place, and time. She displays normal reflexes. No cranial nerve deficit.  Skin: No rash noted.  Psychiatric: She has a normal mood and affect. Her behavior is normal. Judgment and thought content normal.          Assessment & Plan:  Complete physical. Confirm date of last Pneumovax and tetanus. Patient needs repeat colonoscopy this year. Labs reviewed with patient. She will consider repeat mammogram this year. Continue weightbearing exercise and calcium and vitamin D.

## 2011-04-27 HISTORY — PX: CATARACT EXTRACTION W/ INTRAOCULAR LENS  IMPLANT, BILATERAL: SHX1307

## 2011-08-19 ENCOUNTER — Ambulatory Visit (INDEPENDENT_AMBULATORY_CARE_PROVIDER_SITE_OTHER): Payer: BC Managed Care – PPO | Admitting: Sports Medicine

## 2011-08-19 ENCOUNTER — Encounter: Payer: Self-pay | Admitting: Sports Medicine

## 2011-08-19 VITALS — BP 114/69 | HR 67

## 2011-08-19 DIAGNOSIS — M25562 Pain in left knee: Secondary | ICD-10-CM | POA: Insufficient documentation

## 2011-08-19 DIAGNOSIS — M25569 Pain in unspecified knee: Secondary | ICD-10-CM

## 2011-08-19 NOTE — Progress Notes (Signed)
  Subjective:    Patient ID: Caitlin Hicks, female    DOB: 07-05-1945, 66 y.o.   MRN: [id number]  HPI  Pt presents to clinic for evaluation of lt knee pain flare since 07/28/11. [place] the airport [place] the day before the pain started. Knee is swollen and warm, has some shooting pain behind knee. Pain was waking her up at night, but is not now. Takes tylenol for pain which is slightly helpful.  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      Review of Systems     Objective:   Physical Exam   Gait - lacks full extension, and less flexion and extension than rt  Medial degenerative changes bilat knees Rt knee has minor crepitation   Lt knee slightly warm and swollen No crepitation Lacks full extension 3-5 degrees Sit home test- does not get to full extension  Pain at 130 deg knee flexion on rt 100 deg of flexion on lt Ligaments stable bilat Mcmurray's neg on lt No pain on medial or lateral joint line on lt  MSK [place] Suprapatellar pouch has mod increase in swellilng that extends 7 cm above patella Medial and lateral menisci are calcified Quad and Patellar tendons normal           Assessment & Plan:

## 2011-08-19 NOTE — Patient Instructions (Signed)
Wear compression on your knee when you are being active  Please do suggested knee exercises daily  Biking is very good for your knees  Please follow up as needed  Thank you for seeing [place] today!

## 2011-08-19 NOTE — Assessment & Plan Note (Signed)
She is inclined to very conservative care  We will use knee compression when active Limited exercises to strengthen the quad but has to limit the degree of flexion and pain with these Work on getting extension  Cut down on total walking while this heals  Tylenol  Reck in 6 weeks to see if extension improving and less swelling

## 2011-08-31 ENCOUNTER — Ambulatory Visit (INDEPENDENT_AMBULATORY_CARE_PROVIDER_SITE_OTHER): Payer: BC Managed Care – PPO | Admitting: Family Medicine

## 2011-08-31 ENCOUNTER — Other Ambulatory Visit: Payer: Self-pay | Admitting: Family Medicine

## 2011-08-31 VITALS — BP 116/72 | HR 76 | Temp 98.4°F | Resp 16 | Ht 59.5 in | Wt 102.2 lb

## 2011-08-31 DIAGNOSIS — M542 Cervicalgia: Secondary | ICD-10-CM

## 2011-08-31 DIAGNOSIS — M62838 Other muscle spasm: Secondary | ICD-10-CM

## 2011-08-31 MED ORDER — CYCLOBENZAPRINE HCL 5 MG PO TABS: ORAL_TABLET | ORAL | Status: DC

## 2011-08-31 MED ORDER — OXYCODONE-ACETAMINOPHEN 5-325 MG PO TABS: ORAL_TABLET | ORAL | Status: DC

## 2011-08-31 NOTE — Progress Notes (Signed)
  Subjective:    Patient ID: Caitlin Hicks, female    DOB: 1945-07-07, 66 y.o.   MRN: [id number]  HPI Caitlin Hicks is a 66 y.o. female L lateral neck pain - started 3 days ago.  Hurts with movement, worse to right.  Pain into upper shoulder muscle, and now radiating pain to side to face.  NKI.  No arm weakness.  No known prior symptoms. On enteric coated steroid for ulcerative colitis past 3 days.    Tx:  Tylenol - 1-2 times per day, 1/2 oxycodone last night.  Min relief.   Review of Systems  Constitutional: Negative for fever and chills.  HENT: Positive for neck pain.   Skin: Negative for rash.  Neurological: Negative for weakness.   Per hpi.    Objective:   Physical Exam  Constitutional: She is oriented to person, place, and time. She appears well-developed and well-nourished.  HENT:  Head: Normocephalic and atraumatic.  Left Ear: Tympanic membrane and external ear normal.  Neck: Carotid bruit is not present.  Pulmonary/Chest: Effort normal.  Musculoskeletal:       Cervical back: She exhibits spasm. She exhibits normal range of motion and no bony tenderness.       Back:  Neurological: She is alert and oriented to person, place, and time. She has normal strength. No sensory deficit.  Reflex Scores:      Tricep reflexes are 2+ on the right side and 2+ on the left side.      Bicep reflexes are 2+ on the right side and 2+ on the left side.      Brachioradialis reflexes are 2+ on the right side and 2+ on the left side. Skin: Skin is warm. No rash noted. No erythema.       No skin dysesthesias.  Psychiatric: She has a normal mood and affect. Her behavior is normal.          Assessment & Plan:  Caitlin Hicks is a 66 y.o. female Neck pain - suspect underlying DDD, with neuronal impingement - and now muscle spasm. Recently started on enteric coated budesonide.   Discussed x ray, but declined today.  Trial of hot compresses, gentle rom, soft neck collar  - only for a few  hours at a time, then needs to attempt rom, and EITHER flexeril or percocet - SED, and instructed to not combine.  Neck care manual.  Rtc precautions.

## 2011-08-31 NOTE — Patient Instructions (Signed)
Short term use of the neck brace.  Moist heat, range of motion as able.  Flexeril OR percocet as prescribed.  Do not combine.  Recheck in 4 days if not improving. Return to the clinic or go to the nearest emergency room if any of your symptoms worsen or new symptoms occur.

## 2011-09-03 ENCOUNTER — Other Ambulatory Visit: Payer: Self-pay | Admitting: Internal Medicine

## 2011-09-09 ENCOUNTER — Ambulatory Visit (INDEPENDENT_AMBULATORY_CARE_PROVIDER_SITE_OTHER): Payer: BC Managed Care – PPO | Admitting: Family Medicine

## 2011-09-09 ENCOUNTER — Encounter: Payer: Self-pay | Admitting: Family Medicine

## 2011-09-09 VITALS — BP 115/63 | HR 65

## 2011-09-09 DIAGNOSIS — M542 Cervicalgia: Secondary | ICD-10-CM

## 2011-09-09 NOTE — Progress Notes (Signed)
[facility] [address] [place], [place] [address] Phone: [phone] Fax: [phone]   Patient Name: SIRENITY SHEW Date of Birth: May 26, 1945 Medical Record Number: [id number] Gender: female Date of Encounter: 09/09/2011  History of Present Illness:  Caitlin Hicks is a 66 y.o. very pleasant female patient who presents with the following:  Pleasant patient who presented with neck pain acutely with known OA and saw Dr. Neva Seat at [facility], who gave her some percocet and flexeril, has taken 2 1/2 percocets and has been unable to tolerate flexeril.   Wants to know about rehab to work on her loss of motion, which has been improving, and globally improving over the last week.  Patient Active Problem List  Diagnosis  . Anomalous atrioventricular excitation  . ABNORMAL ELECTROCARDIOGRAM  . PSEUDOGOUT  . KNEE PAIN, RIGHT  . PATELLAR DISLOCATION, RIGHT  . Ulcerative colitis  . Knee pain, left   Past Medical History  Diagnosis Date  . Segmental colitis   . Right inguinal hernia 1980  . Osteoporosis   . Ulcerative colitis   . WPW (Wolff-Parkinson-White syndrome)    Past Surgical History  Procedure Date  . Breast biopsy 02/22/1995    rt  . Hernia repair     02/24/2002   History  Substance Use Topics  . Smoking status: Never Smoker   . Smokeless tobacco: Never Used  . Alcohol Use: Yes     3-4 drinks wine per week   Family History  Problem Relation Age of Onset  . Osteoporosis Mother   . Prostate cancer     Allergies  Allergen Reactions  . Sulfonamide Derivatives Rash    Medication list has been reviewed and updated.  Prior to Admission medications   Medication Sig Start Date End Date Taking? Authorizing Provider  Budesonide (UCERIS) 9 MG TB24 Take 1 tablet by mouth daily.    Historical Provider, MD  calcium-vitamin D (OSCAL WITH D) 500-200 MG-UNIT per tablet Take 1 tablet by mouth daily.    Historical Provider, MD  cholecalciferol  (VITAMIN D) 1000 UNITS tablet Take 1,000 Units by mouth daily.    Historical Provider, MD  cyclobenzaprine (FLEXERIL) 5 MG tablet 1 pill by mouth up to every 8 hours as needed.  Start with one pill by mouth at bedtime as needed due to sedation. 08/31/11   Shade Flood, MD  INDERAL 10 MG tablet TAKE ONE TABLET AS DIRECTED. TAKE 1 WITH RECURRENT SVT AND MAY TAKE 2ND 1 20 MIN LATER. 09/03/11   Duke Salvia, MD  mesalamine (ASACOL) 400 MG EC tablet Take 400 mg by mouth daily.    Historical Provider, MD  MULTIPLE VITAMIN PO Take by mouth.    Historical Provider, MD  oxyCODONE-acetaminophen (ROXICET) 5-325 MG per tablet 1/2 to 1 pill by mouth ever 6 hours as needed. 08/31/11   Shade Flood, MD  Probiotic Product (PRO-BIOTIC BLEND PO) Take by mouth.    Historical Provider, MD  propranolol (INDERAL) 20 MG tablet Take 20 mg by mouth as needed.    Historical Provider, MD    Review of Systems:   GEN: No fevers, chills. Nontoxic. Primarily MSK c/o today. MSK: Detailed in the HPI GI: tolerating PO intake without difficulty Neuro: No numbness, parasthesias, or tingling associated. Otherwise the pertinent positives of the ROS are noted above.    Physical Examination: Filed Vitals:   09/09/11 1545  BP: 115/63  Pulse: 65   There were no vitals filed  for this visit. There is no height or weight on file to calculate BMI.   GEN: Well-developed,well-nourished,in no acute distress; alert,appropriate and cooperative throughout examination HEENT: Normocephalic and atraumatic without obvious abnormalities. Ears, externally no deformities PULM: Breathing comfortably in no respiratory distress EXT: No clubbing, cyanosis, or edema PSYCH: Normally interactive. Cooperative during the interview. Pleasant. Friendly and conversant. Not anxious or depressed appearing. Normal, full affect.  CERVICAL SPINE EXAM Range of motion: Flexion, extension, lateral bending, and rotation: dramatic loss of motion, approx  60-70% loss of motion with notable terminal pain Spinous Processes: NT SCM: NT Upper paracervical muscles: most at L Upper traps: most on L C5-T1 intact, sensation and motor   Assessment and Plan: 1. Neck pain on left side     Reviewed rehab  Try 1/4 flexeril at night  Hannah Beat, MD

## 2011-09-09 NOTE — Patient Instructions (Signed)
Google "[facility]"  Click on the videos section  "cervical" section  Or Youtube: McKenzie protocol cervical spine

## 2011-10-12 ENCOUNTER — Ambulatory Visit: Payer: BC Managed Care – PPO

## 2011-10-12 ENCOUNTER — Ambulatory Visit (INDEPENDENT_AMBULATORY_CARE_PROVIDER_SITE_OTHER): Payer: BC Managed Care – PPO | Admitting: Family Medicine

## 2011-10-12 VITALS — BP 110/58 | HR 79 | Temp 97.7°F | Resp 16 | Ht 59.5 in | Wt 96.4 lb

## 2011-10-12 DIAGNOSIS — M549 Dorsalgia, unspecified: Secondary | ICD-10-CM

## 2011-10-12 DIAGNOSIS — R0602 Shortness of breath: Secondary | ICD-10-CM

## 2011-10-12 NOTE — Progress Notes (Signed)
 [facility]:  Office Visit  Chief Complaint:  Chief Complaint  Patient presents with  . Shortness of Breath    back pain, chest discmfort x 6 dys    HPI: Caitlin Hicks is a 66 y.o. female who complains of : 6 day h/o SOB with deep inspiration associated with scapular pain. Deneis any other sxs. Denies fevers, cough, leg pain, leg swelling. NO prior h/o  DVT. Only risk factor was on 4 hour flight 6 days ago from [place] to [place]. She is on steroid x 1 month for UC.    Past Medical History  Diagnosis Date  . Segmental colitis   . Right inguinal hernia 1980  . Osteoporosis   . Ulcerative colitis   . WPW (Wolff-Parkinson-White syndrome)    Past Surgical History  Procedure Date  . Breast biopsy 02/22/1995    rt  . Hernia repair     02/24/2002   History   Social History  . Marital Status: Married    Spouse Name: N/A    Number of Children: N/A  . Years of Education: N/A   Social History Main Topics  . Smoking status: Never Smoker   . Smokeless tobacco: Never Used  . Alcohol Use: Yes     3-4 drinks wine per week  . Drug Use: No  . Sexually Active: Not Currently   Other Topics Concern  . None   Social History Narrative   Charity fundraiser for [employer]   Family History  Problem Relation Age of Onset  . Osteoporosis Mother   . Prostate cancer     Allergies  Allergen Reactions  . Sulfonamide Derivatives Rash   Prior to Admission medications   Medication Sig Start Date End Date Taking? Authorizing Provider  Budesonide (UCERIS) 9 MG TB24 Take 1 tablet by mouth daily.   Yes Historical Provider, MD  calcium-vitamin D (OSCAL WITH D) 500-200 MG-UNIT per tablet Take 1 tablet by mouth daily.   Yes Historical Provider, MD  cholecalciferol (VITAMIN D) 1000 UNITS tablet Take 1,000 Units by mouth daily.   Yes Historical Provider, MD  INDERAL 10 MG tablet TAKE ONE TABLET AS DIRECTED. TAKE 1 WITH RECURRENT SVT AND MAY TAKE 2ND 1 20 MIN LATER. 09/03/11  Yes  Duke Salvia, MD  mesalamine (ASACOL) 400 MG EC tablet Take 400 mg by mouth daily.   Yes Historical Provider, MD  MULTIPLE VITAMIN PO Take by mouth.   Yes Historical Provider, MD  Probiotic Product (PRO-BIOTIC BLEND PO) Take by mouth.   Yes Historical Provider, MD  cyclobenzaprine (FLEXERIL) 5 MG tablet 1 pill by mouth up to every 8 hours as needed.  Start with one pill by mouth at bedtime as needed due to sedation. 08/31/11   Shade Flood, MD  oxyCODONE-acetaminophen (ROXICET) 5-325 MG per tablet 1/2 to 1 pill by mouth ever 6 hours as needed. 08/31/11   Shade Flood, MD  propranolol (INDERAL) 20 MG tablet Take 20 mg by mouth as needed.    Historical Provider, MD     ROS: The patient denies fevers, chills, night sweats, unintentional weight loss, chest pain, palpitations, wheezing, dyspnea on exertion, nausea, vomiting, abdominal pain, dysuria, hematuria, melena, numbness, weakness, or tingling.   All other systems have been reviewed and were otherwise negative with the exception of those mentioned in the HPI and as above.    PHYSICAL EXAM: Filed Vitals:   10/12/11 1042  BP: 110/58  Pulse: 79  Temp: 97.7  F (36.5 C)  Resp: 16   Filed Vitals:   10/12/11 1042  Height: 4' 11.5" (1.511 m)  Weight: 96 lb 6.4 oz (43.727 kg)   Body mass index is 19.14 kg/(m^2).  General: Alert, no acute distress HEENT:  Normocephalic, atraumatic, oropharynx patent. EOMI, PERRLA Cardiovascular:  Regular rate and rhythm, no rubs murmurs or gallops.  No Carotid bruits, radial pulse intact. No pedal edema.  Respiratory: Clear to auscultation bilaterally.  No wheezes, rales, or rhonchi.  No cyanosis, no use of accessory musculature GI: No organomegaly, abdomen is soft and non-tender, positive bowel sounds.  No masses. Skin: No rashes. Neurologic: Facial musculature symmetric. Psychiatric: Patient is appropriate throughout our interaction. Lymphatic: No cervical lymphadenopathy Musculoskeletal: Gait  intact. Leg is symmetric, nontender   LABS: Results for orders placed in visit on 04/09/11  HM MAMMOGRAPHY      Component Value Range   HM Mammogram normal    HM COLONOSCOPY      Component Value Range   HM Colonoscopy normal       EKG/XRAY:   Primary read interpreted by Dr. Conley Rolls at [facility]. No pneumo, infiltrate, effusion, fx or sublux on CXR   ASSESSMENT/PLAN: Encounter Diagnoses  Name Primary?  . SOB (shortness of breath) Yes  . Back pain     Secondary to back pain Monitor for worsening sxs, if worsening SOB/CP then need to rule our DVT/PE   ,  PHUONG, DO 10/12/2011 11:03 AM

## 2011-10-13 ENCOUNTER — Telehealth: Payer: Self-pay | Admitting: Family Medicine

## 2011-10-13 NOTE — Telephone Encounter (Signed)
[facility] Triage Call Report Triage Record Num: [id number] Operator: Hillary Bow Patient Name: Caitlin Hicks Call Date & Time: 10/11/2011 2:51:42PM Patient Phone: [phone] PCP: Evelena Peat Patient Gender: Female PCP Fax : [phone] Patient DOB: [birth year] [facility] Name: [facility] Reason for Call: ER CALL. Caller: Brett/Patient; PCP: Evelena Peat; CB#: [phone]; Call regarding R side Chest Pain, onset, 7-14. Pt fly home from [place] on 7-14. Afebrile. Pt wanting Chest xray. Pt complains of pain when taking deep breath. Chest Pain Protocol used. Advised Pt to go to UC due moderate pain with deep breath. Pt verbalized understanding. Protocol(s) Used: Chest Pain Recommended Outcome per Protocol: See Provider within 4 hours Reason for Outcome: Moderate to severe pain occurring with deep breath or a productive cough for one full day or more

## 2012-10-25 ENCOUNTER — Encounter (INDEPENDENT_AMBULATORY_CARE_PROVIDER_SITE_OTHER): Payer: BC Managed Care – PPO | Admitting: General Surgery

## 2012-11-11 ENCOUNTER — Other Ambulatory Visit (INDEPENDENT_AMBULATORY_CARE_PROVIDER_SITE_OTHER): Payer: BC Managed Care – PPO

## 2012-11-11 DIAGNOSIS — Z0000 Encounter for general adult medical examination without abnormal findings: Secondary | ICD-10-CM

## 2012-11-11 LAB — BASIC METABOLIC PANEL
BUN: 10 mg/dL (ref 6–23)
Chloride: 109 mEq/L (ref 96–112)
Creatinine, Ser: 0.7 mg/dL (ref 0.4–1.2)
Glucose, Bld: 84 mg/dL (ref 70–99)
Potassium: 4.1 mEq/L (ref 3.5–5.1)
Sodium: 139 mEq/L (ref 135–145)

## 2012-11-11 LAB — CBC WITH DIFFERENTIAL/PLATELET
Basophils Absolute: 0 10*3/uL (ref 0.0–0.1)
Lymphocytes Relative: 21.4 % (ref 12.0–46.0)
MCHC: 33.4 g/dL (ref 30.0–36.0)
Monocytes Relative: 6.1 % (ref 3.0–12.0)
Neutro Abs: 4.9 10*3/uL (ref 1.4–7.7)
Neutrophils Relative %: 70.8 % (ref 43.0–77.0)
Platelets: 317 10*3/uL (ref 150.0–400.0)
RDW: 14.8 % — ABNORMAL HIGH (ref 11.5–14.6)

## 2012-11-11 LAB — LIPID PANEL
Cholesterol: 161 mg/dL (ref 0–200)
Triglycerides: 82 mg/dL (ref 0.0–149.0)
VLDL: 16.4 mg/dL (ref 0.0–40.0)

## 2012-11-11 LAB — HEPATIC FUNCTION PANEL
Alkaline Phosphatase: 73 U/L (ref 39–117)
Bilirubin, Direct: 0 mg/dL (ref 0.0–0.3)
Total Bilirubin: 0.5 mg/dL (ref 0.3–1.2)

## 2012-11-11 LAB — POCT URINALYSIS DIPSTICK
Blood, UA: NEGATIVE
Glucose, UA: NEGATIVE
Spec Grav, UA: 1.02
Urobilinogen, UA: 0.2
pH, UA: 6

## 2012-11-17 ENCOUNTER — Encounter: Payer: BC Managed Care – PPO | Admitting: Family Medicine

## 2012-11-24 ENCOUNTER — Ambulatory Visit (INDEPENDENT_AMBULATORY_CARE_PROVIDER_SITE_OTHER): Payer: BC Managed Care – PPO | Admitting: Family Medicine

## 2012-11-24 ENCOUNTER — Encounter: Payer: Self-pay | Admitting: Family Medicine

## 2012-11-24 VITALS — BP 112/62 | HR 62 | Temp 97.5°F | Ht 59.0 in | Wt 97.0 lb

## 2012-11-24 DIAGNOSIS — I456 Pre-excitation syndrome: Secondary | ICD-10-CM

## 2012-11-24 DIAGNOSIS — Z0000 Encounter for general adult medical examination without abnormal findings: Secondary | ICD-10-CM

## 2012-11-24 MED ORDER — PROPRANOLOL HCL 10 MG PO TABS: ORAL_TABLET | ORAL | Status: DC

## 2012-11-24 MED ORDER — DIAZEPAM 5 MG PO TABS: 5.0000 mg | ORAL_TABLET | Freq: Two times a day (BID) | ORAL | Status: DC | PRN

## 2012-11-24 NOTE — Patient Instructions (Addendum)
Osteoporosis Throughout your life, your body breaks down old bone and replaces it with new bone. As you get older, your body does not replace bone as quickly as it breaks it down. By the age of 30 years, most people begin to gradually lose bone because of the imbalance between bone loss and replacement. Some people lose more bone than others. Bone loss beyond a specified normal degree is considered osteoporosis.  Osteoporosis affects the strength and durability of your bones. The inside of the ends of your bones and your flat bones, like the bones of your pelvis, look like honeycomb, filled with tiny open spaces. As bone loss occurs, your bones become less dense. This means that the open spaces inside your bones become bigger and the walls between these spaces become thinner. This makes your bones weaker. Bones of a person with osteoporosis can become so weak that they can break (fracture) during minor accidents, such as a simple fall. CAUSES  The following factors have been associated with the development of osteoporosis:  Smoking.  Drinking more than 2 alcoholic drinks several days per week.  Long-term use of certain medicines:  Corticosteroids.  Chemotherapy medicines.  Thyroid medicines.  Antiepileptic medicines.  Gonadal hormone suppression medicine.  Immunosuppression medicine.  Being underweight.  Lack of physical activity.  Lack of exposure to the sun. This can lead to vitamin D deficiency.  Certain medical conditions:  Certain inflammatory bowel diseases, such as Crohn's disease and ulcerative colitis.  Diabetes.  Hyperthyroidism.  Hyperparathyroidism. RISK FACTORS Anyone can develop osteoporosis. However, the following factors can increase your risk of developing osteoporosis:  Gender Women are at higher risk than men.  Age Being older than 50 years increases your risk.  Ethnicity White and Asian people have an increased risk.  Weight Being extremely  underweight can increase your risk of osteoporosis.  Family history of osteoporosis Having a family member who has developed osteoporosis can increase your risk. SYMPTOMS  Usually, people with osteoporosis have no symptoms.  DIAGNOSIS  Signs during a physical exam that may prompt your caregiver to suspect osteoporosis include:  Decreased height. This is usually caused by the compression of the bones that form your spine (vertebrae) because they have weakened and become fractured.  A curving or rounding of the upper back (kyphosis). To confirm signs of osteoporosis, your caregiver may request a procedure that uses 2 low-dose X-ray beams with different levels of energy to measure your bone mineral density (dual-energy X-ray absorptiometry [DXA]). Also, your caregiver may check your level of vitamin D. TREATMENT  The goal of osteoporosis treatment is to strengthen bones in order to decrease the risk of bone fractures. There are different types of medicines available to help achieve this goal. Some of these medicines work by slowing the processes of bone loss. Some medicines work by increasing bone density. Treatment also involves making sure that your levels of calcium and vitamin D are adequate. PREVENTION  There are things you can do to help prevent osteoporosis. Adequate intake of calcium and vitamin D can help you achieve optimal bone mineral density. Regular exercise can also help, especially resistance and weight-bearing activities. If you smoke, quitting smoking is an important part of osteoporosis prevention. MAKE SURE YOU:  Understand these instructions.  Will watch your condition.  Will get help right away if you are not doing well or get worse. Document Released: 12/18/2004 Document Revised: 02/25/2012 Document Reviewed: 02/22/2011 [facility] Patient Information 2014 [place], [place].  Remember flu vaccine this fall

## 2012-11-24 NOTE — Progress Notes (Signed)
Subjective:    Patient ID: Caitlin Hicks, female    DOB: 02-27-1946, 67 y.o.   MRN: [id number]  HPI  Patient here for complete physical. She has history of Wolff-Parkinson-White syndrome. Has rare episodes of SVT which are treated with Inderal.. She has declined ablation procedure after full discussion of risk and benefits with cardiology She has ulcerative colitis and had recent flareup. She is followed closely by gastroenterology. Most recent colonoscopy was last year.  She has decided against any further Pap smears or mammograms. She states she had Pneumovax at age 57. She states tetanus is up to date. She plans to get flu vaccine later  Past Medical History  Diagnosis Date  . Segmental colitis   . Right inguinal hernia 1980  . Osteoporosis   . Ulcerative colitis   . WPW (Wolff-Parkinson-White syndrome)    Past Surgical History  Procedure Laterality Date  . Breast biopsy  02/22/1995    rt  . Hernia repair      02/24/2002    reports that she has never smoked. She has never used smokeless tobacco. She reports that  drinks alcohol. She reports that she does not use illicit drugs. family history includes Osteoporosis in her mother; Prostate cancer in an other family member. Allergies  Allergen Reactions  . Sulfonamide Derivatives Rash     Review of Systems  Constitutional: Negative for fever, activity change, appetite change, fatigue and unexpected weight change.  HENT: Negative for hearing loss, ear pain, sore throat and trouble swallowing.   Eyes: Negative for visual disturbance.  Respiratory: Negative for cough and shortness of breath.   Cardiovascular: Negative for chest pain and palpitations.  Gastrointestinal: Negative for abdominal pain, diarrhea, constipation and blood in stool.  Endocrine: Negative for polydipsia and polyuria.  Genitourinary: Negative for dysuria and hematuria.  Musculoskeletal: Negative for myalgias, back pain and arthralgias.  Skin: Negative  for rash.  Neurological: Negative for dizziness, syncope and headaches.  Hematological: Negative for adenopathy.  Psychiatric/Behavioral: Negative for confusion and dysphoric mood.       Objective:   Physical Exam  Constitutional: She is oriented to person, place, and time. She appears well-developed and well-nourished.  HENT:  Head: Normocephalic and atraumatic.  Eyes: EOM are normal. Pupils are equal, round, and reactive to light.  Neck: Normal range of motion. Neck supple. No thyromegaly present.  Cardiovascular: Normal rate, regular rhythm and normal heart sounds.   No murmur heard. Pulmonary/Chest: Breath sounds normal. No respiratory distress. She has no wheezes. She has no rales.  Abdominal: Soft. Bowel sounds are normal. She exhibits no distension and no mass. There is no tenderness. There is no rebound and no guarding.  Genitourinary:  Breasts symmetric with no mass.  Pelvic declined per pt.  Musculoskeletal: Normal range of motion. She exhibits no edema.  Lymphadenopathy:    She has no cervical adenopathy.  Neurological: She is alert and oriented to person, place, and time. She displays normal reflexes. No cranial nerve deficit.  Skin: No rash noted.  Psychiatric: She has a normal mood and affect. Her behavior is normal. Judgment and thought content normal.          Assessment & Plan:  Health maintenance. Labs reviewed with patient and all favorable. Continue regular exercise habits. Recommend regular calcium and vitamin D. She has decided against any further mammograms or Pap smears. We discussed bone density scanning and she has decided against any medications to treat this and declines any further DEXA scan at  this time. Previous Pneumovax age 49. Consider Prevnar

## 2012-12-20 ENCOUNTER — Encounter: Payer: Self-pay | Admitting: Family Medicine

## 2012-12-20 DIAGNOSIS — M8580 Other specified disorders of bone density and structure, unspecified site: Secondary | ICD-10-CM

## 2012-12-22 NOTE — Telephone Encounter (Signed)
Will set up DEXA-

## 2012-12-27 ENCOUNTER — Inpatient Hospital Stay: Admission: RE | Admit: 2012-12-27 | Payer: BC Managed Care – PPO | Source: Ambulatory Visit

## 2012-12-29 ENCOUNTER — Encounter: Payer: Self-pay | Admitting: Family Medicine

## 2012-12-29 ENCOUNTER — Ambulatory Visit (INDEPENDENT_AMBULATORY_CARE_PROVIDER_SITE_OTHER)
Admission: RE | Admit: 2012-12-29 | Discharge: 2012-12-29 | Disposition: A | Discharge status: Home / Self Care | Payer: BC Managed Care – PPO | Source: Ambulatory Visit | Attending: Family Medicine | Admitting: Family Medicine

## 2012-12-29 DIAGNOSIS — M899 Disorder of bone, unspecified: Secondary | ICD-10-CM

## 2013-01-09 ENCOUNTER — Emergency Department (HOSPITAL_COMMUNITY)
Admission: EM | Admit: 2013-01-09 | Discharge: 2013-01-09 | Disposition: A | Discharge status: Home / Self Care | Payer: BC Managed Care – PPO | Attending: Emergency Medicine | Admitting: Emergency Medicine

## 2013-01-09 ENCOUNTER — Encounter (HOSPITAL_COMMUNITY): Payer: Self-pay | Admitting: Emergency Medicine

## 2013-01-09 ENCOUNTER — Emergency Department (HOSPITAL_COMMUNITY): Payer: BC Managed Care – PPO

## 2013-01-09 DIAGNOSIS — Y9389 Activity, other specified: Secondary | ICD-10-CM | POA: Insufficient documentation

## 2013-01-09 DIAGNOSIS — Z79899 Other long term (current) drug therapy: Secondary | ICD-10-CM | POA: Insufficient documentation

## 2013-01-09 DIAGNOSIS — S42302A Unspecified fracture of shaft of humerus, left arm, initial encounter for closed fracture: Secondary | ICD-10-CM

## 2013-01-09 DIAGNOSIS — M129 Arthropathy, unspecified: Secondary | ICD-10-CM | POA: Insufficient documentation

## 2013-01-09 DIAGNOSIS — K5010 Crohn's disease of large intestine without complications: Secondary | ICD-10-CM | POA: Insufficient documentation

## 2013-01-09 DIAGNOSIS — Z8679 Personal history of other diseases of the circulatory system: Secondary | ICD-10-CM | POA: Insufficient documentation

## 2013-01-09 DIAGNOSIS — M810 Age-related osteoporosis without current pathological fracture: Secondary | ICD-10-CM | POA: Insufficient documentation

## 2013-01-09 DIAGNOSIS — W1789XA Other fall from one level to another, initial encounter: Secondary | ICD-10-CM | POA: Insufficient documentation

## 2013-01-09 DIAGNOSIS — Y92009 Unspecified place in unspecified non-institutional (private) residence as the place of occurrence of the external cause: Secondary | ICD-10-CM | POA: Insufficient documentation

## 2013-01-09 DIAGNOSIS — S42213A Unspecified displaced fracture of surgical neck of unspecified humerus, initial encounter for closed fracture: Secondary | ICD-10-CM | POA: Insufficient documentation

## 2013-01-09 MED ORDER — MORPHINE SULFATE 4 MG/ML IJ SOLN
6.0000 mg | Freq: Once | INTRAMUSCULAR | Status: AC
  Administered 2013-01-09: 6 mg via INTRAVENOUS
  Filled 2013-01-09: qty 2

## 2013-01-09 MED ORDER — MORPHINE SULFATE 4 MG/ML IJ SOLN
4.0000 mg | Freq: Once | INTRAMUSCULAR | Status: AC
  Administered 2013-01-09: 4 mg via INTRAVENOUS
  Filled 2013-01-09: qty 1

## 2013-01-09 MED ORDER — ONDANSETRON HCL 4 MG/2ML IJ SOLN
4.0000 mg | Freq: Once | INTRAMUSCULAR | Status: AC
  Administered 2013-01-09: 4 mg via INTRAVENOUS
  Filled 2013-01-09: qty 2

## 2013-01-09 MED ORDER — OXYCODONE-ACETAMINOPHEN 5-325 MG PO TABS: 1.0000 | ORAL_TABLET | ORAL | Status: DC | PRN

## 2013-01-09 NOTE — ED Notes (Signed)
Pt fell off stool and landed on shoulder, c/o severe pain, left shoulder deformed,no movement without pain

## 2013-01-09 NOTE — ED Provider Notes (Signed)
CSN: [id number]     Arrival date & time 01/09/13  1901 History   First MD Initiated Contact with Patient 01/09/13 1916     Chief Complaint  Patient presents with  . Shoulder Pain   The history is provided by the patient.   the patient reports she was standing on a stool when she fell off this afternoon at home.  She reports injuring her left shoulder.  She denies head injury.  No neck pain.  No loss consciousness.  She denies chest pain abdominal pain.  Her pain is worse with range of motion palpation.  No left elbow pain.  Past Medical History  Diagnosis Date  . Segmental colitis   . Right inguinal hernia 1980  . Osteoporosis   . Ulcerative colitis   . WPW (Wolff-Parkinson-White syndrome)    Past Surgical History  Procedure Laterality Date  . Breast biopsy  02/22/1995    rt  . Hernia repair      02/24/2002   Family History  Problem Relation Age of Onset  . Osteoporosis Mother   . Prostate cancer     History  Substance Use Topics  . Smoking status: Never Smoker   . Smokeless tobacco: Never Used  . Alcohol Use: Yes     Comment: 3-4 drinks wine per week   OB History   Grav Para Term Preterm Abortions TAB SAB Ect Mult Living                 Review of Systems  All other systems reviewed and are negative.    Allergies  Sulfonamide derivatives  Home Medications   Current Outpatient Rx  Name  Route  Sig  Dispense  Refill  . calcium-vitamin D (OSCAL WITH D) 500-200 MG-UNIT per tablet   Oral   Take 1 tablet by mouth daily.         . cholecalciferol (VITAMIN D) 1000 UNITS tablet   Oral   Take 1,000 Units by mouth daily.         . mesalamine (LIALDA) 1.2 G EC tablet   Oral   Take 2.4 g by mouth 2 (two) times daily. Two tablets in the morning and Two tablets at bedtime         . propranolol (INDERAL) 10 MG tablet      Take one tablet as directed.  Take 1 with recurrent SVT and may take 2nd 1 20 min later.   30 tablet   1   . oxyCODONE-acetaminophen  (PERCOCET/ROXICET) 5-325 MG per tablet   Oral   Take 1 tablet by mouth every 4 (four) hours as needed for pain.   30 tablet   0    BP 130/66  Pulse 72  Temp(Src) 97.6 F (36.4 C) (Oral)  SpO2 100% Physical Exam  Nursing note and vitals reviewed. Constitutional: She is oriented to person, place, and time. She appears well-developed and well-nourished. No distress.  HENT:  Head: Normocephalic and atraumatic.  Eyes: EOM are normal.  Neck: Normal range of motion.  C-spine nontender.  Cardiovascular: Normal rate, regular rhythm and normal heart sounds.   Pulmonary/Chest: Effort normal and breath sounds normal.  Abdominal: Soft. She exhibits no distension. There is no tenderness.  Musculoskeletal: Normal range of motion.  Obvious deformity of left proximal humerus.  Normal left radial pulse.  Normal strength in left hand.  Normal sensation left upper extremity  Neurological: She is alert and oriented to person, place, and time.  Skin: Skin is  warm and dry.  Psychiatric: She has a normal mood and affect. Judgment normal.    ED Course  Procedures (including critical care time) Labs Review Labs Reviewed - No data to display Imaging Review Dg Shoulder Left  01/09/2013   CLINICAL DATA:  Fall, left shoulder pain.  EXAM: LEFT SHOULDER - 2+ VIEW  COMPARISON:  None.  FINDINGS: There is a fracture through the left humeral neck. No subluxation or dislocation. The fracture is angulated and displaced.  IMPRESSION: Angulated and displaced left humeral neck fracture.   Electronically Signed   By: Charlett Nose M.D.   On: 01/09/2013 20:03  I personally reviewed the imaging tests through PACS system I reviewed available ER/hospitalization records through the EMR   SPLINT/SLING APPLICATION Date/Time: 07/30/2012 3:38 PM Authorized by: Lyanne Co Consent: Verbal consent obtained. Risks and benefits: risks, benefits and alternatives were discussed Consent given by: patient Splint applied by:  nurse Location details: Left shoulder  Splint type: Left shoulder immobilizer  Post-procedure: The splinted body part was neurovascularly unchanged following the procedure. Patient tolerance: Patient tolerated the procedure well with no immediate complications.     EKG Interpretation   None       MDM   1. Humerus fracture, left, closed, initial encounter    Left humeral neck fracture.  Discharge home with orthopedic followup.  Neurovascularly intact.  Closed fracture.    Lyanne Co, MD 01/09/13 2138

## 2013-01-09 NOTE — ED Notes (Signed)
Patient transported to X-ray 

## 2013-01-10 ENCOUNTER — Encounter (HOSPITAL_COMMUNITY): Payer: Self-pay | Admitting: *Deleted

## 2013-01-10 MED ORDER — CEFAZOLIN SODIUM-DEXTROSE 2-3 GM-% IV SOLR
2.0000 g | INTRAVENOUS | Status: AC
  Administered 2013-01-11: 2 g via INTRAVENOUS
  Filled 2013-01-10: qty 50

## 2013-01-11 ENCOUNTER — Encounter (HOSPITAL_COMMUNITY)
Admission: RE | Disposition: A | Discharge status: Home / Self Care | Payer: Self-pay | Source: Ambulatory Visit | Attending: Orthopedic Surgery

## 2013-01-11 ENCOUNTER — Encounter (HOSPITAL_COMMUNITY): Payer: BC Managed Care – PPO | Admitting: Anesthesiology

## 2013-01-11 ENCOUNTER — Encounter (HOSPITAL_COMMUNITY): Payer: Self-pay | Admitting: *Deleted

## 2013-01-11 ENCOUNTER — Ambulatory Visit (HOSPITAL_COMMUNITY): Payer: BC Managed Care – PPO | Admitting: Anesthesiology

## 2013-01-11 ENCOUNTER — Observation Stay (HOSPITAL_COMMUNITY)
Admission: RE | Admit: 2013-01-11 | Discharge: 2013-01-12 | Disposition: A | Discharge status: Home / Self Care | Payer: BC Managed Care – PPO | Source: Ambulatory Visit | Attending: Orthopedic Surgery | Admitting: Orthopedic Surgery

## 2013-01-11 ENCOUNTER — Ambulatory Visit (HOSPITAL_COMMUNITY): Payer: BC Managed Care – PPO

## 2013-01-11 DIAGNOSIS — W19XXXA Unspecified fall, initial encounter: Secondary | ICD-10-CM | POA: Insufficient documentation

## 2013-01-11 DIAGNOSIS — Z79899 Other long term (current) drug therapy: Secondary | ICD-10-CM | POA: Insufficient documentation

## 2013-01-11 DIAGNOSIS — S42202A Unspecified fracture of upper end of left humerus, initial encounter for closed fracture: Secondary | ICD-10-CM

## 2013-01-11 DIAGNOSIS — M4150 Other secondary scoliosis, site unspecified: Secondary | ICD-10-CM | POA: Insufficient documentation

## 2013-01-11 DIAGNOSIS — S42213A Unspecified displaced fracture of surgical neck of unspecified humerus, initial encounter for closed fracture: Principal | ICD-10-CM | POA: Insufficient documentation

## 2013-01-11 DIAGNOSIS — M810 Age-related osteoporosis without current pathological fracture: Secondary | ICD-10-CM | POA: Insufficient documentation

## 2013-01-11 HISTORY — DX: Noninfective gastroenteritis and colitis, unspecified: K52.9

## 2013-01-11 HISTORY — PX: ORIF PROXIMAL HUMERUS FRACTURE: SUR951

## 2013-01-11 HISTORY — PX: ORIF HUMERUS FRACTURE: SHX2126

## 2013-01-11 HISTORY — DX: Unspecified osteoarthritis, unspecified site: M19.90

## 2013-01-11 LAB — BASIC METABOLIC PANEL
BUN: 10 mg/dL (ref 6–23)
CO2: 25 mEq/L (ref 19–32)
Calcium: 9.6 mg/dL (ref 8.4–10.5)
Creatinine, Ser: 0.7 mg/dL (ref 0.50–1.10)
GFR calc non Af Amer: 86 mL/min — ABNORMAL LOW (ref >90)
Glucose, Bld: 93 mg/dL (ref 70–99)

## 2013-01-11 LAB — CBC
HCT: 36.6 % (ref 36.0–46.0)
Hemoglobin: 11.8 g/dL — ABNORMAL LOW (ref 12.0–15.0)
MCH: 28.2 pg (ref 26.0–34.0)
MCHC: 32.2 g/dL (ref 30.0–36.0)
MCV: 87.4 fL (ref 78.0–100.0)
RBC: 4.19 MIL/uL (ref 3.87–5.11)

## 2013-01-11 SURGERY — OPEN REDUCTION INTERNAL FIXATION (ORIF) PROXIMAL HUMERUS FRACTURE
Anesthesia: General | Site: Shoulder | Laterality: Left | Wound class: Clean

## 2013-01-11 MED ORDER — FLEET ENEMA 7-19 GM/118ML RE ENEM: 1.0000 | ENEMA | Freq: Once | RECTAL | Status: AC | PRN

## 2013-01-11 MED ORDER — TEMAZEPAM 15 MG PO CAPS: 15.0000 mg | ORAL_CAPSULE | Freq: Every evening | ORAL | Status: DC | PRN

## 2013-01-11 MED ORDER — DEXAMETHASONE SODIUM PHOSPHATE 10 MG/ML IJ SOLN
INTRAMUSCULAR | Status: DC | PRN
  Administered 2013-01-11: 4 mg via INTRAVENOUS

## 2013-01-11 MED ORDER — PHENOL 1.4 % MT LIQD: 1.0000 | OROMUCOSAL | Status: DC | PRN

## 2013-01-11 MED ORDER — PHENYLEPHRINE HCL 10 MG/ML IJ SOLN
10.0000 mg | INTRAVENOUS | Status: DC | PRN
  Administered 2013-01-11: 25 ug/min via INTRAVENOUS

## 2013-01-11 MED ORDER — LIDOCAINE HCL (CARDIAC) 20 MG/ML IV SOLN
INTRAVENOUS | Status: DC | PRN
  Administered 2013-01-11: 50 mg via INTRAVENOUS

## 2013-01-11 MED ORDER — DEXTROSE 5 % IV SOLN: 500.0000 mg | Freq: Four times a day (QID) | INTRAVENOUS | Status: DC | PRN

## 2013-01-11 MED ORDER — FENTANYL CITRATE 0.05 MG/ML IJ SOLN
INTRAMUSCULAR | Status: DC | PRN
  Administered 2013-01-11: 150 ug via INTRAVENOUS

## 2013-01-11 MED ORDER — LACTATED RINGERS IV SOLN
INTRAVENOUS | Status: DC | PRN
  Administered 2013-01-11 (×2): via INTRAVENOUS

## 2013-01-11 MED ORDER — MENTHOL 3 MG MT LOZG
LOZENGE | OROMUCOSAL | Status: AC
  Filled 2013-01-11: qty 9

## 2013-01-11 MED ORDER — DIPHENHYDRAMINE HCL 12.5 MG/5ML PO ELIX: 12.5000 mg | ORAL_SOLUTION | ORAL | Status: DC | PRN

## 2013-01-11 MED ORDER — ALBUMIN HUMAN 5 % IV SOLN
INTRAVENOUS | Status: DC | PRN
  Administered 2013-01-11: 16:00:00 via INTRAVENOUS

## 2013-01-11 MED ORDER — METHOCARBAMOL 500 MG PO TABS
500.0000 mg | ORAL_TABLET | Freq: Four times a day (QID) | ORAL | Status: DC | PRN
  Administered 2013-01-11: 500 mg via ORAL

## 2013-01-11 MED ORDER — POLYETHYLENE GLYCOL 3350 17 G PO PACK: 17.0000 g | PACK | Freq: Every day | ORAL | Status: DC | PRN

## 2013-01-11 MED ORDER — NEOSTIGMINE METHYLSULFATE 1 MG/ML IJ SOLN
INTRAMUSCULAR | Status: DC | PRN
  Administered 2013-01-11: 3 mg via INTRAVENOUS

## 2013-01-11 MED ORDER — BISACODYL 10 MG RE SUPP: 10.0000 mg | Freq: Every day | RECTAL | Status: DC | PRN

## 2013-01-11 MED ORDER — METOCLOPRAMIDE HCL 5 MG/ML IJ SOLN: 5.0000 mg | Freq: Three times a day (TID) | INTRAMUSCULAR | Status: DC | PRN

## 2013-01-11 MED ORDER — ACETAMINOPHEN 650 MG RE SUPP: 650.0000 mg | Freq: Four times a day (QID) | RECTAL | Status: DC | PRN

## 2013-01-11 MED ORDER — KETOROLAC TROMETHAMINE 15 MG/ML IJ SOLN
15.0000 mg | Freq: Four times a day (QID) | INTRAMUSCULAR | Status: DC
  Administered 2013-01-11 – 2013-01-12 (×2): 15 mg via INTRAVENOUS
  Filled 2013-01-11 (×6): qty 1

## 2013-01-11 MED ORDER — CEFAZOLIN SODIUM 1-5 GM-% IV SOLN
1.0000 g | Freq: Four times a day (QID) | INTRAVENOUS | Status: AC
  Administered 2013-01-11 – 2013-01-12 (×3): 1 g via INTRAVENOUS
  Filled 2013-01-11 (×3): qty 50

## 2013-01-11 MED ORDER — MENTHOL 3 MG MT LOZG: 1.0000 | LOZENGE | OROMUCOSAL | Status: DC | PRN

## 2013-01-11 MED ORDER — ROCURONIUM BROMIDE 100 MG/10ML IV SOLN
INTRAVENOUS | Status: DC | PRN
  Administered 2013-01-11: 20 mg via INTRAVENOUS
  Administered 2013-01-11: 10 mg via INTRAVENOUS
  Administered 2013-01-11: 40 mg via INTRAVENOUS
  Administered 2013-01-11: 10 mg via INTRAVENOUS

## 2013-01-11 MED ORDER — HYDROMORPHONE HCL PF 1 MG/ML IJ SOLN
0.2500 mg | INTRAMUSCULAR | Status: DC | PRN
  Administered 2013-01-12: 1 mg via INTRAVENOUS
  Filled 2013-01-11: qty 1

## 2013-01-11 MED ORDER — MIDAZOLAM HCL 2 MG/2ML IJ SOLN
2.0000 mg | Freq: Once | INTRAMUSCULAR | Status: AC
  Administered 2013-01-11: 2 mg via INTRAVENOUS

## 2013-01-11 MED ORDER — DOCUSATE SODIUM 100 MG PO CAPS
100.0000 mg | ORAL_CAPSULE | Freq: Two times a day (BID) | ORAL | Status: DC
  Administered 2013-01-12: 100 mg via ORAL
  Filled 2013-01-11 (×2): qty 1

## 2013-01-11 MED ORDER — ONDANSETRON HCL 4 MG PO TABS: 4.0000 mg | ORAL_TABLET | Freq: Four times a day (QID) | ORAL | Status: DC | PRN

## 2013-01-11 MED ORDER — PROPRANOLOL HCL 10 MG PO TABS
5.0000 mg | ORAL_TABLET | Freq: Two times a day (BID) | ORAL | Status: DC | PRN
  Filled 2013-01-11: qty 0.5

## 2013-01-11 MED ORDER — ONDANSETRON HCL 4 MG/2ML IJ SOLN: 4.0000 mg | Freq: Four times a day (QID) | INTRAMUSCULAR | Status: DC | PRN

## 2013-01-11 MED ORDER — GLYCOPYRROLATE 0.2 MG/ML IJ SOLN
INTRAMUSCULAR | Status: DC | PRN
  Administered 2013-01-11: 0.2 mg via INTRAVENOUS
  Administered 2013-01-11: 0.4 mg via INTRAVENOUS
  Administered 2013-01-11: 0.2 mg via INTRAVENOUS

## 2013-01-11 MED ORDER — OXYCODONE-ACETAMINOPHEN 5-325 MG PO TABS
ORAL_TABLET | ORAL | Status: AC
  Filled 2013-01-11: qty 1

## 2013-01-11 MED ORDER — FENTANYL CITRATE 0.05 MG/ML IJ SOLN
100.0000 ug | Freq: Once | INTRAMUSCULAR | Status: AC
  Administered 2013-01-11: 100 ug via INTRAVENOUS

## 2013-01-11 MED ORDER — HYDROMORPHONE HCL PF 1 MG/ML IJ SOLN
INTRAMUSCULAR | Status: AC
  Filled 2013-01-11: qty 1

## 2013-01-11 MED ORDER — ONDANSETRON HCL 4 MG/2ML IJ SOLN
INTRAMUSCULAR | Status: DC | PRN
  Administered 2013-01-11: 4 mg via INTRAVENOUS

## 2013-01-11 MED ORDER — LACTATED RINGERS IV SOLN
INTRAVENOUS | Status: DC
  Administered 2013-01-11: 13:00:00 via INTRAVENOUS

## 2013-01-11 MED ORDER — METHOCARBAMOL 500 MG PO TABS
ORAL_TABLET | ORAL | Status: AC
  Filled 2013-01-11: qty 1

## 2013-01-11 MED ORDER — 0.9 % SODIUM CHLORIDE (POUR BTL) OPTIME
TOPICAL | Status: DC | PRN
  Administered 2013-01-11: 1000 mL

## 2013-01-11 MED ORDER — ALUM & MAG HYDROXIDE-SIMETH 200-200-20 MG/5ML PO SUSP: 30.0000 mL | ORAL | Status: DC | PRN

## 2013-01-11 MED ORDER — FENTANYL CITRATE 0.05 MG/ML IJ SOLN: 25.0000 ug | INTRAMUSCULAR | Status: DC | PRN

## 2013-01-11 MED ORDER — PROPOFOL 10 MG/ML IV BOLUS
INTRAVENOUS | Status: DC | PRN
  Administered 2013-01-11: 140 mg via INTRAVENOUS

## 2013-01-11 MED ORDER — ACETAMINOPHEN 325 MG PO TABS: 650.0000 mg | ORAL_TABLET | Freq: Four times a day (QID) | ORAL | Status: DC | PRN

## 2013-01-11 MED ORDER — OXYCODONE-ACETAMINOPHEN 5-325 MG PO TABS
1.0000 | ORAL_TABLET | ORAL | Status: DC | PRN
Start: 2013-01-11 — End: 2013-01-12
  Administered 2013-01-11: 1 via ORAL
  Administered 2013-01-12 (×3): 2 via ORAL
  Filled 2013-01-11 (×4): qty 2

## 2013-01-11 MED ORDER — ARTIFICIAL TEARS OP OINT
TOPICAL_OINTMENT | OPHTHALMIC | Status: DC | PRN
  Administered 2013-01-11: 1 via OPHTHALMIC

## 2013-01-11 MED ORDER — METOCLOPRAMIDE HCL 10 MG PO TABS: 5.0000 mg | ORAL_TABLET | Freq: Three times a day (TID) | ORAL | Status: DC | PRN

## 2013-01-11 MED ORDER — MESALAMINE 1.2 G PO TBEC
2.4000 g | DELAYED_RELEASE_TABLET | Freq: Two times a day (BID) | ORAL | Status: DC
  Administered 2013-01-12: 2.4 g via ORAL
  Filled 2013-01-11 (×3): qty 2

## 2013-01-11 MED ORDER — CHLORHEXIDINE GLUCONATE 4 % EX LIQD: 60.0000 mL | Freq: Once | CUTANEOUS | Status: DC

## 2013-01-11 MED ORDER — LACTATED RINGERS IV SOLN: INTRAVENOUS | Status: DC

## 2013-01-11 SURGICAL SUPPLY — 84 items
BIT DRILL 2.8X4 QC CORT (BIT) ×1 IMPLANT
BIT DRILL 4 LONG FAST STEP (BIT) ×1 IMPLANT
CLOTH BEACON ORANGE TIMEOUT ST (SAFETY) ×1 IMPLANT
DRAPE C-ARM 42X72 X-RAY (DRAPES) ×1 IMPLANT
DRAPE INCISE IOBAN 66X45 STRL (DRAPES) ×2 IMPLANT
DRAPE POUCH INSTRU U-SHP 10X18 (DRAPES) ×2 IMPLANT
DRAPE SURG 17X11 SM STRL (DRAPES) ×2 IMPLANT
DRAPE U-SHAPE 47X51 STRL (DRAPES) ×2 IMPLANT
DRSG AQUACEL AG ADV 3.5X10 (GAUZE/BANDAGES/DRESSINGS) ×1 IMPLANT
DRSG EMULSION OIL 3X3 NADH (GAUZE/BANDAGES/DRESSINGS) ×2 IMPLANT
DRSG PAD ABDOMINAL 8X10 ST (GAUZE/BANDAGES/DRESSINGS) ×2 IMPLANT
ELECT REM PT RETURN 9FT ADLT (ELECTROSURGICAL) ×2
ELECTRODE REM PT RTRN 9FT ADLT (ELECTROSURGICAL) ×1 IMPLANT
GLOVE BIO SURGEON STRL SZ7.5 (GLOVE) ×2 IMPLANT
GLOVE BIO SURGEON STRL SZ8 (GLOVE) ×2 IMPLANT
GLOVE EUDERMIC 7 POWDERFREE (GLOVE) ×4 IMPLANT
GLOVE SS BIOGEL STRL SZ 7.5 (GLOVE) ×2 IMPLANT
GLOVE SUPERSENSE BIOGEL SZ 7.5 (GLOVE) ×2
GOWN STRL NON-REIN LRG LVL3 (GOWN DISPOSABLE) ×2 IMPLANT
GOWN STRL REIN XL XLG (GOWN DISPOSABLE) ×4 IMPLANT
K-WIRE 1.25 TRCR POINT 150 (WIRE) ×2
K-WIRE 2.0X150M (WIRE) ×4
KIT BASIN OR (CUSTOM PROCEDURE TRAY) ×2 IMPLANT
KIT ROOM TURNOVER OR (KITS) ×4 IMPLANT
KWIRE 1.25 TRCR POINT 150 (WIRE) IMPLANT
KWIRE 2.0X150M (WIRE) IMPLANT
MANIFOLD NEPTUNE II (INSTRUMENTS) ×2 IMPLANT
NEEDLE 22X1 1/2 (OR ONLY) (NEEDLE) ×2 IMPLANT
NS IRRIG 1000ML POUR BTL (IV SOLUTION) ×2 IMPLANT
PACK SHOULDER (CUSTOM PROCEDURE TRAY) ×2 IMPLANT
PAD ARMBOARD 7.5X6 YLW CONV (MISCELLANEOUS) ×4 IMPLANT
PEG STND 4.0X30MM (Orthopedic Implant) ×4 IMPLANT
PEG STND 4.0X32.5MM (Orthopedic Implant) ×2 IMPLANT
PEG STND 4.0X35MM (Orthopedic Implant) ×10 IMPLANT
PEG STND 4.0X37.5MM (Orthopedic Implant) ×2 IMPLANT
PEG STND 4.0X40MM (Orthopedic Implant) ×4 IMPLANT
PEG STND 4.0X45.0MM (Orthopedic Implant) ×2 IMPLANT
PEG THREADED 4.0X25.0MM (Orthopedic Implant) ×1 IMPLANT
PEG THREADED 4.0X35.0MM (Orthopedic Implant) ×1 IMPLANT
PEG THREADED 4.0X40.0MM (Orthopedic Implant) ×1 IMPLANT
PEGSTD 4.0X30MM (Orthopedic Implant) IMPLANT
PEGSTD 4.0X32.5MM (Orthopedic Implant) IMPLANT
PEGSTD 4.0X35MM (Orthopedic Implant) IMPLANT
PEGSTD 4.0X37.5MM (Orthopedic Implant) IMPLANT
PEGSTD 4.0X40MM (Orthopedic Implant) IMPLANT
PEGSTD 4.0X45.0MM (Orthopedic Implant) IMPLANT
PIN GUIDE SHOULDER 2.0MM (PIN) ×1 IMPLANT
PLATE LCP 3.5 PROX HUM 3HX90 (Plate) ×2 IMPLANT
PLATE LCP 3.5 PROX HUM 5HX114 (Plate) ×2 IMPLANT
PLATE SHOULDER S3 3HOLE LT (Plate) ×1 IMPLANT
SCREW CORTEX 3.5 24MM (Screw) ×1 IMPLANT
SCREW CORTEX 3.5 28MM (Screw) ×1 IMPLANT
SCREW LOCK 90D ANGLED 3.8X26 (Screw) ×1 IMPLANT
SCREW LOCK 90D ANGLED 3.8X32 (Screw) ×1 IMPLANT
SCREW LOCK CORT ST 3.5X24 (Screw) IMPLANT
SCREW LOCK CORT ST 3.5X28 (Screw) IMPLANT
SCREW LOCK T15 FT 38X3.5XST (Screw) IMPLANT
SCREW LOCK T15 FT 40X3.5XST (Screw) IMPLANT
SCREW LOCKING 3.5X26 (Screw) ×3 IMPLANT
SCREW LOCKING 3.5X34 (Screw) ×1 IMPLANT
SCREW LOCKING 3.5X38 (Screw) ×2 IMPLANT
SCREW LOCKING 3.5X40 (Screw) ×8 IMPLANT
SCREW LOCKING 3.5X42 (Screw) ×1 IMPLANT
SCREW MULTIDIR SS 3.8X28 HUMRL (Screw) ×1 IMPLANT
SLEEVE SURGEON STRL (DRAPES) ×1 IMPLANT
SLING ARM FOAM STRAP MED (SOFTGOODS) ×1 IMPLANT
SPONGE GAUZE 4X4 12PLY (GAUZE/BANDAGES/DRESSINGS) ×2 IMPLANT
SPONGE LAP 18X18 X RAY DECT (DISPOSABLE) ×4 IMPLANT
STRIP CLOSURE SKIN 1/2X4 (GAUZE/BANDAGES/DRESSINGS) ×3 IMPLANT
SUCTION FRAZIER TIP 10 FR DISP (SUCTIONS) ×2 IMPLANT
SUT ETHIBOND NAB CT1 #1 30IN (SUTURE) ×4 IMPLANT
SUT FIBERWIRE #2 38 T-5 BLUE (SUTURE)
SUT MNCRL AB 3-0 PS2 18 (SUTURE) ×2 IMPLANT
SUT VIC AB 0 CT1 27 (SUTURE) ×2
SUT VIC AB 0 CT1 27XBRD ANBCTR (SUTURE) ×1 IMPLANT
SUT VIC AB 2-0 CT1 27 (SUTURE) ×4
SUT VIC AB 2-0 CT1 TAPERPNT 27 (SUTURE) ×2 IMPLANT
SUT VICRYL 4-0 PS2 18IN ABS (SUTURE) ×2 IMPLANT
SUTURE FIBERWR #2 38 T-5 BLUE (SUTURE) IMPLANT
SYR CONTROL 10ML LL (SYRINGE) ×2 IMPLANT
TOWEL OR 17X24 6PK STRL BLUE (TOWEL DISPOSABLE) ×2 IMPLANT
TOWEL OR 17X26 10 PK STRL BLUE (TOWEL DISPOSABLE) ×2 IMPLANT
WATER STERILE IRR 1000ML POUR (IV SOLUTION) ×1 IMPLANT
YANKAUER SUCT BULB TIP NO VENT (SUCTIONS) ×2 IMPLANT

## 2013-01-11 NOTE — Transfer of Care (Signed)
Immediate Anesthesia Transfer of Care Note  Patient: Caitlin Hicks  Procedure(s) Performed: Procedure(s) with comments: LEFT ORIF PROXIMAL HUMERUS FRACTURE (Left) - SAME DAY LABS  Patient Location: PACU  Anesthesia Type:General  Level of Consciousness: awake, alert  and oriented  Airway & Oxygen Therapy: Patient connected to nasal cannula oxygen  Post-op Assessment: Report given to PACU RN  Post vital signs: stable  Complications: No apparent anesthesia complications

## 2013-01-11 NOTE — Anesthesia Postprocedure Evaluation (Signed)
  Anesthesia Post-op Note  Patient: Caitlin Hicks  Procedure(s) Performed: Procedure(s) with comments: LEFT ORIF PROXIMAL HUMERUS FRACTURE (Left) - SAME DAY LABS  Patient Location: PACU  Anesthesia Type:General  Level of Consciousness: awake, alert  and oriented  Airway and Oxygen Therapy: Patient Spontanous Breathing  Post-op Pain: mild  Post-op Assessment: Post-op Vital signs reviewed  Post-op Vital Signs: Reviewed  Complications: No apparent anesthesia complications

## 2013-01-11 NOTE — Anesthesia Preprocedure Evaluation (Addendum)
Anesthesia Evaluation  Patient identified by MRN, date of birth, ID band Patient awake    Reviewed: Allergy & Precautions, H&P , NPO status , Patient's Chart, lab work & pertinent test results  Airway Mallampati: II      Dental   Pulmonary neg pulmonary ROS,  breath sounds clear to auscultation        Cardiovascular + dysrhythmias Rhythm:Regular Rate:Normal     Neuro/Psych    GI/Hepatic Neg liver ROS, PUD,   Endo/Other  negative endocrine ROS  Renal/GU negative Renal ROS     Musculoskeletal   Abdominal   Peds  Hematology   Anesthesia Other Findings   Reproductive/Obstetrics                          Anesthesia Physical Anesthesia Plan  ASA: III  Anesthesia Plan: General   Post-op Pain Management: MAC Combined w/ Regional for Post-op pain   Induction: Intravenous  Airway Management Planned: Oral ETT  Additional Equipment:   Intra-op Plan:   Post-operative Plan:   Informed Consent: I have reviewed the patients History and Physical, chart, labs and discussed the procedure including the risks, benefits and alternatives for the proposed anesthesia with the patient or authorized representative who has indicated his/her understanding and acceptance.   Dental advisory given  Plan Discussed with: CRNA, Anesthesiologist and Surgeon  Anesthesia Plan Comments:        Anesthesia Quick Evaluation

## 2013-01-11 NOTE — Anesthesia Procedure Notes (Addendum)
Anesthesia Regional Block:  Supraclavicular block  Pre-Anesthetic Checklist: ,, timeout performed, Correct Patient, Correct Site, Correct Laterality, Correct Procedure, Correct Position, site marked, Risks and benefits discussed,  Surgical consent,  Pre-op evaluation,  At surgeon's request and post-op pain management  Laterality: Left  Prep: chloraprep       Needles:   Needle Type: Stimulator Needle - 40          Additional Needles:  Procedures: Doppler guided and nerve stimulator Supraclavicular block  Nerve Stimulator or Paresthesia:  Response: 0.5 mA,   Additional Responses:   Narrative:  Start time: 01/11/2013 2:00 PM End time: 01/11/2013 2:20 PM Anesthesiologist: Dr. Stephanie Coup   Procedure Name: Intubation Date/Time: 01/11/2013 2:53 PM Performed by: Ellin Goodie Pre-anesthesia Checklist: Patient identified, Emergency Drugs available, Suction available, Patient being monitored and Timeout performed Patient Re-evaluated:Patient Re-evaluated prior to inductionOxygen Delivery Method: Circle system utilized Preoxygenation: Pre-oxygenation with 100% oxygen Intubation Type: IV induction Ventilation: Mask ventilation without difficulty Laryngoscope Size: Mac and 3 Grade View: Grade I Tube type: Oral Tube size: 7.5 mm Number of attempts: 1 Airway Equipment and Method: Stylet Placement Confirmation: ETT inserted through vocal cords under direct vision,  positive ETCO2 and breath sounds checked- equal and bilateral Secured at: 21 cm Tube secured with: Tape Dental Injury: Teeth and Oropharynx as per pre-operative assessment  Comments: Easy atraumatic induction and intubation with MAC 3 blade.  Dr. Randa Evens verified placement of ETT.  Carlynn Herald, CRNA

## 2013-01-11 NOTE — H&P (Signed)
[name]    Chief Complaint: LEFT PROXIMAL HUMERUS FRACTURE HPI: The patient is a 67 y.o. female with a displaced left 2 part proximal humerus fracture  Past Medical History  Diagnosis Date  . Segmental colitis   . Right inguinal hernia 1980  . Osteoporosis   . Ulcerative colitis   . WPW (Wolff-Parkinson-White syndrome)   . Arthritis   . Colitis     Past Surgical History  Procedure Laterality Date  . Tonsillectomy    . Breast biopsy Right 02/22/1995  . Inguinal hernia repair Left 1981  . Tubal ligation    . Cataract extraction      Family History  Problem Relation Age of Onset  . Osteoporosis Mother   . Prostate cancer      Social History:  reports that she has never smoked. She has never used smokeless tobacco. She reports that she drinks alcohol. She reports that she does not use illicit drugs.  Allergies:  Allergies  Allergen Reactions  . Sulfonamide Derivatives Rash    Medications Prior to Admission  Medication Sig Dispense Refill  . calcium-vitamin D (OSCAL WITH D) 500-200 MG-UNIT per tablet Take 1 tablet by mouth daily.      . cholecalciferol (VITAMIN D) 1000 UNITS tablet Take 1,000 Units by mouth daily.      . mesalamine (LIALDA) 1.2 G EC tablet Take 2.4 g by mouth 2 (two) times daily. Two tablets in the morning and Two tablets at bedtime      . oxyCODONE-acetaminophen (PERCOCET/ROXICET) 5-325 MG per tablet Take 1 tablet by mouth every 4 (four) hours as needed for pain.      Marland Kitchen propranolol (INDERAL) 10 MG tablet Take one tablet as directed.  Take 1 with recurrent SVT and may take 2nd 1 20 min later.  30 tablet  1     Physical Exam: Left shoulder with painful and restricted motion as noted at yesterdays office visit.  Vitals  Temp:  [98.6 F (37 C)] 98.6 F (37 C) (10/21 1200) Pulse Rate:  [56] 56 (10/21 1200) Resp:  [16] 16 (10/21 1200) BP: (133)/(68) 133/68 mmHg (10/21 1200) SpO2:  [100 %] 100 % (10/21 1200) Weight:  [44.906 kg (99 lb)-45.048 kg  (99 lb 5 oz)] 45.048 kg (99 lb 5 oz) (10/21 1200)  Assessment/Plan  Impression: LEFT PROXIMAL HUMERUS FRACTURE  Plan of Action: Procedure(s): LEFT ORIF PROXIMAL HUMERUS FRACTURE  Jamall Strohmeier M 01/11/2013, 2:15 PM

## 2013-01-11 NOTE — Op Note (Signed)
01/11/2013  6:04 PM  PATIENT:   Caitlin Hicks  67 y.o. female  PRE-OPERATIVE DIAGNOSIS:  DISPLACED LEFT PROXIMAL HUMERUS FRACTURE  POST-OPERATIVE DIAGNOSIS:  Same  PROCEDURE:  ORIF  SURGEON:  Camauri Fleece, Vania Rea. M.D.  ASSISTANTS: Shuford pac   ANESTHESIA:   GET + ISB  EBL: 250  SPECIMEN:  none  Drains: none   PATIENT DISPOSITION:  PACU - hemodynamically stable.    PLAN OF CARE: Admit for overnight observation  Dictation# [id number], [id number]

## 2013-01-12 ENCOUNTER — Ambulatory Visit: Payer: BC Managed Care – PPO | Admitting: Family Medicine

## 2013-01-12 DIAGNOSIS — S42202A Unspecified fracture of upper end of left humerus, initial encounter for closed fracture: Secondary | ICD-10-CM

## 2013-01-12 MED ORDER — HYDROMORPHONE HCL 2 MG PO TABS: 2.0000 mg | ORAL_TABLET | ORAL | Status: DC | PRN

## 2013-01-12 MED ORDER — DIAZEPAM 2 MG PO TABS: 2.0000 mg | ORAL_TABLET | Freq: Four times a day (QID) | ORAL | Status: DC | PRN

## 2013-01-12 NOTE — Op Note (Signed)
Caitlin Hicks, Caitlin Hicks                ACCOUNT NO.:  [id number]  MEDICAL RECORD NO.:  [id number]  LOCATION:  5N14C                        FACILITY:  [facility]  PHYSICIAN:  Vania Rea. Errik Mitchelle, M.D.  DATE OF BIRTH:  17-Aug-1945  DATE OF PROCEDURE:  01/11/2013 DATE OF DISCHARGE:                              OPERATIVE REPORT   ADDENDUM:  Caitlin Hicks, P.A.-C was used as a Product manager throughout this case essential for help with positioning extremity, retraction, manipulation of the fracture site, and facilitating implantation of our fracture fixation construct, wound closer, and intraoperative decision making.     Vania Rea. Alexea Blase, M.D.     KMS/MEDQ  D:  01/11/2013  T:  01/12/2013  Job:  [id number]

## 2013-01-12 NOTE — Progress Notes (Signed)
RN reviewed discharge instructions with patient and husband. All questions answered, and patient stated she understood the teaching.

## 2013-01-12 NOTE — Op Note (Signed)
NAMEKIRBY, ARGUETA                ACCOUNT NO.:  [id number]  MEDICAL RECORD NO.:  [id number]  LOCATION:  5N14C                        FACILITY:  [facility]  PHYSICIAN:  Vania Rea. Avon Molock, M.D.  DATE OF BIRTH:  11/29/1945  DATE OF PROCEDURE:  01/11/2013 DATE OF DISCHARGE:                              OPERATIVE REPORT   PREOPERATIVE DIAGNOSIS:  Severely displaced left 2-part proximal humerus fracture.  POSTOPERATIVE DIAGNOSIS:  Severely displaced left 2-part proximal humerus fracture.  PROCEDURE:  An open reduction and internal fixation of displaced left 2- part proximal humerus fracture.  SURGEON:  Vania Rea. Viren Lebeau, M.D.  Threasa HeadsFrench Ana A. Shuford, P.A.-C.  ANESTHESIA:  General endotracheal as well as an interscalene block.  ESTIMATED BLOOD LOSS:  250 mL.  DRAINS:  None.  HISTORY:  Ms. Bhardwaj is a 67 year old female, who fell sustaining a severely displaced angulated left two-part proximal humerus fracture. She is brought to the operating room at this time for planned open reduction and internal fixation.  Carefully I counseled Ms. Hellinger on treatment options as well as risks versus benefits thereof.  Possible surgical complications were reviewed including the potential for bleeding, infection, neurovascular injury, malunion, nonunion, loss of fixation, and possible need for additional surgery.  She understands and accepts and agrees with our planned procedure.  PROCEDURE IN DETAIL:  After undergoing routine preop evaluation, the patient received prophylactic antibiotics.  An interscalene block was established in the holding area by the Anesthesia Department.  Placed supine on the operating table, underwent smooth induction of general endotracheal anesthesia.  Placed in beach-chair position and appropriate padding protected.  We obtained initial fluoroscopic images of the shoulder, which did confirm that this was a displaced short oblique fracture of the proximal humeral  metaphyseal region and so would be most amenable to ORIF with a plate and screw construct.  At this point, then we prepped the left shoulder girdle region in a standard technique. Time-out was called.  We made an anterior approach, left shoulder through a 12 cm deltopectoral incision.  Skin flaps were elevated. Dissection was carried deeply.  Cephalic vein was identified, retracted laterally with the deltoid.  Interval was then developed from proximal to distal.  The upper centimeter of the pec major was tenotomized for visualization.  We then also divided the anterior aspect of the distal deltoid insertion to gain access to the anterolateral aspect of the humeral shaft to the placement of our plate.  At this point, the fracture site was then readily identified and divided adhesions in the subacromial/subdeltoid bursal space.  We initially tried to place a Biomet S3 plate and transfixed this in place, and placed some provisional screws into the head and shaft, but unfortunately we were unable to gain appropriate reduction and control of the fracture site after multiple attempts.  Once it was clear that the S3 plate was not the appropriate fixation for this particular fracture pattern, we went ahead and removed the provisional fixation as the plate and went with a Synthes proximal humeral locking plate.  In the process of placing some of the shaft screws, we did identify the fracture line that was extending more distally into the humeral  shaft, and so we went ahead and used the 5-hole proximal humeral plate to give [place] plenty of distance beyond the slight comminution, which extended the humeral shaft.  We did utilize fluoroscopic guidance throughout this, and ultimately we were able to place the fixation centered within the humeral head on AP and lateral views and transfixed the plate to the anterolateral aspect of the humeral shaft.  The overall construct was much to our satisfaction with  good alignment of the fracture site, good position of the hardware and solid fixation of the fracture repair construct.  At this point, final fluoroscopic images were then obtained.  Wound was copiously irrigated.  Hemostasis was obtained.  I repaired the anterior portion of the distal deltoid insertion with figure-of-eight #1 Vicryl sutures.  #1 Vicryl was used to reapproximate the deltopectoral interval; 2-0 Vicryl was used for the subcu layer and intracuticular 3-0 Monocryl for the skin followed by Steri-Strips.  Sling was applied to the left upper extremity, and at this point, the patient was then awakened, extubated, and taken to the recovery room in stable condition.     Vania Rea. Tyrena Gohr, M.D.     KMS/MEDQ  D:  01/11/2013  T:  01/12/2013  Job:  [id number]

## 2013-01-12 NOTE — Evaluation (Signed)
Occupational Therapy Evaluation Patient Details Name: Caitlin Hicks MRN: [id number] DOB: [birth year] Today's Date: 01/12/2013 Time: 4132-4401 OT Time Calculation (min): 53 min  OT Assessment / Plan / Recommendation History of present illness s/p ORIF of proximal humerus following a fall.   Clinical Impression   All education completed--ADL, sling use, positioning in bed and chair, elbow to hand AROM .  No further OT needs.   OT Assessment  Patient does not need any further OT services    Follow Up Recommendations  No OT follow up;Supervision - Intermittent    Barriers to Discharge      Equipment Recommendations  None recommended by OT    Recommendations for Other Services    Frequency       Precautions / Restrictions Precautions Precautions: Shoulder Type of Shoulder Precautions: dangle only, sling Shoulder Interventions: Shoulder sling/immobilizer;Off for dressing/bathing/exercises Precaution Booklet Issued: Yes (comment) Required Braces or Orthoses: Sling   Pertinent Vitals/Pain 3/10, L shoulder, repositioned, RN notified    ADL  Eating/Feeding: Independent Where Assessed - Eating/Feeding: Chair Grooming: Wash/dry hands;Wash/dry face;Supervision/safety Where Assessed - Grooming: Unsupported standing Upper Body Bathing: Modified independent Where Assessed - Upper Body Bathing: Unsupported sitting Lower Body Bathing: Modified independent Where Assessed - Lower Body Bathing: Unsupported sitting;Supported sit to stand Upper Body Dressing: Modified independent Where Assessed - Upper Body Dressing: Unsupported sitting Lower Body Dressing: Modified independent Where Assessed - Lower Body Dressing: Unsupported sitting;Supported sit to stand Toilet Transfer: Modified independent Toilet Transfer Method: Sit to Barista: Comfort height toilet Toileting - Clothing Manipulation and Hygiene: Modified independent Where Assessed - Toileting Clothing  Manipulation and Hygiene: Sit to stand from 3-in-1 or toilet Transfers/Ambulation Related to ADLs: independent no device ADL Comments: Instructed in hemi techniques for bathing, dressing, sling use.    OT Diagnosis:    OT Problem List:   OT Treatment Interventions:     OT Goals(Current goals can be found in the care plan section) Acute Rehab OT Goals Patient Stated Goal: regain use of L shoulder  Visit Information  Last OT Received On: 01/12/13 Assistance Needed: +1 History of Present Illness: s/p ORIF of proximal humerus following a fall.       Prior Functioning     Home Living Family/patient expects to be discharged to:: Private residence Living Arrangements: Spouse/significant other Available Help at Discharge: Available PRN/intermittently Type of Home: House Home Layout: Two level Alternate Level Stairs-Number of Steps: 13 Alternate Level Stairs-Rails: Right;Left;Can reach both Home Equipment: Grab bars - tub/shower Prior Function Level of Independence: Independent Communication Communication: No difficulties Dominant Hand: Right         Vision/Perception Vision - History Baseline Vision: Wears glasses only for reading Patient Visual Report: No change from baseline   Cognition  Cognition Arousal/Alertness: Awake/alert Behavior During Therapy: WFL for tasks assessed/performed Overall Cognitive Status: Within Functional Limits for tasks assessed    Extremity/Trunk Assessment Upper Extremity Assessment Upper Extremity Assessment: LUE deficits/detail LUE Deficits / Details: L shoulder may only dangle, sling at all times except ADL and exercise for elbow to hand Lower Extremity Assessment Lower Extremity Assessment: Overall WFL for tasks assessed Cervical / Trunk Assessment Cervical / Trunk Assessment: Normal     Mobility Bed Mobility Bed Mobility: Not assessed Transfers Transfers: Sit to Stand;Stand to Sit Sit to Stand: 7: Independent Stand to Sit: 7:  Independent     Exercise     Balance     End of Session OT - End of Session Activity  Tolerance: Patient tolerated treatment well Patient left: in chair;with call bell/phone within reach;with nursing/sitter in room  GO Functional Assessment Tool Used: clinical judgement Functional Limitation: Self care Self Care Current Status (Z6109): At least 1 percent but less than 20 percent impaired, limited or restricted Self Care Goal Status (U0454): At least 1 percent but less than 20 percent impaired, limited or restricted Self Care Discharge Status [phone]): At least 1 percent but less than 20 percent impaired, limited or restricted   Evern Bio 01/12/2013, 10:54 AM [phone]

## 2013-01-12 NOTE — Discharge Summary (Signed)
PATIENT ID:      Caitlin Hicks  MRN:     [id number] DOB/AGE:    [birth year] / 67 y.o.     DISCHARGE SUMMARY  ADMISSION DATE:    01/11/2013 DISCHARGE DATE:    ADMISSION DIAGNOSIS: LEFT PROXIMAL HUMERUS FRACTURE Past Medical History  Diagnosis Date  . Segmental colitis   . Osteoporosis   . Ulcerative colitis   . WPW (Wolff-Parkinson-White syndrome)   . Colitis   . Arthritis     "knees, fingers" (01/11/2013)    DISCHARGE DIAGNOSIS:   Active Problems:   * No active hospital problems. *   PROCEDURE: Procedure(s): LEFT ORIF PROXIMAL HUMERUS FRACTURE on 01/11/2013  CONSULTS:   none  HISTORY:  See H&P in chart.  HOSPITAL COURSE:  Caitlin Hicks is a 67 y.o. admitted on 01/11/2013 with a chief complaint of left shoulder pain and deformity following mechanical fall, and found to have a diagnosis of LEFT PROXIMAL HUMERUS FRACTURE.  They were brought to the operating room on 01/11/2013 and underwent Procedure(s): LEFT ORIF PROXIMAL HUMERUS FRACTURE.    They were given perioperative antibiotics: Anti-infectives   Start     Dose/Rate Route Frequency Ordered Stop   01/11/13 2100  ceFAZolin (ANCEF) IVPB 1 g/50 mL premix     1 g 100 mL/hr over 30 Minutes Intravenous Every 6 hours 01/11/13 2044 01/12/13 1459   01/11/13 0600  ceFAZolin (ANCEF) IVPB 2 g/50 mL premix     2 g 100 mL/hr over 30 Minutes Intravenous On call to O.R. 01/10/13 1417 01/11/13 1451    .  Patient underwent the above named procedure and tolerated it well. The following day they were hemodynamically stable and pain was controlled on oral analgesics. They were neurovascularly intact to the operative extremity. OT was ordered and worked with patient per protocol. They were medically and orthopaedically stable for discharge on pod 1     DIAGNOSTIC STUDIES:  RECENT RADIOGRAPHIC STUDIES :  Dg Bone Density  12/29/2012   Findings : lowest T score - 3.0 @  Spine.Scoliosis & osteophytes are  present ; these artificially  elevate spine T scores. Diagnosis:  Osteoporosis See Risk & recommendations   Dg Shoulder Left  01/09/2013   CLINICAL DATA:  Fall, left shoulder pain.  EXAM: LEFT SHOULDER - 2+ VIEW  COMPARISON:  None.  FINDINGS: There is a fracture through the left humeral neck. No subluxation or dislocation. The fracture is angulated and displaced.  IMPRESSION: Angulated and displaced left humeral neck fracture.   Electronically Signed   By: Charlett Nose M.D.   On: 01/09/2013 20:03   Dg Humerus Left  01/11/2013   CLINICAL DATA:  Fracture fixation  EXAM: LEFT HUMERUS - 2+ VIEW  COMPARISON:  01/09/2013  FINDINGS: Fracture of the proximal humeral shaft with a plate and multiple screws. Fracture alignment is satisfactory.  IMPRESSION: Satisfactory fracture fixation proximal humerus.   Electronically Signed   By: Marlan Palau M.D.   On: 01/11/2013 19:40    RECENT VITAL SIGNS:  Patient Vitals for the past 24 hrs:  BP Temp Temp src Pulse Resp SpO2 Height Weight  01/12/13 0609 104/50 mmHg 97.7 F (36.5 C) - 62 18 100 % - -  01/12/13 0100 102/49 mmHg 97.8 F (36.6 C) - 64 18 100 % - -  01/11/13 2000 134/46 mmHg 97.5 F (36.4 C) - 47 17 - - -  01/11/13 1945 155/54 mmHg - - 50 11 100 % - -  01/11/13  1930 132/118 mmHg - - 52 - - - -  01/11/13 1915 141/65 mmHg - - 50 - - - -  01/11/13 1900 126/64 mmHg - - 57 - - - -  01/11/13 1845 144/65 mmHg - - 58 - - - -  01/11/13 1830 171/60 mmHg 97.2 F (36.2 C) - - - 95 % - -  01/11/13 1435 - - - 69 14 100 % - -  01/11/13 1200 133/68 mmHg 98.6 F (37 C) Oral 56 16 100 % 4\' 11"  (1.499 m) 45.048 kg (99 lb 5 oz)  .  RECENT EKG RESULTS:    Orders placed during the hospital encounter of 01/11/13  . EKG 12-LEAD  . EKG 12-LEAD    DISCHARGE INSTRUCTIONS:    DISCHARGE MEDICATIONS:     Medication List    STOP taking these medications       oxyCODONE-acetaminophen 5-325 MG per tablet  Commonly known as:  PERCOCET/ROXICET      TAKE these medications        calcium-vitamin D 500-200 MG-UNIT per tablet  Commonly known as:  OSCAL WITH D  Take 1 tablet by mouth daily.     cholecalciferol 1000 UNITS tablet  Commonly known as:  VITAMIN D  Take 1,000 Units by mouth daily.     diazepam 2 MG tablet  Commonly known as:  VALIUM  Take 1 tablet (2 mg total) by mouth every 6 (six) hours as needed (spasm).     HYDROmorphone 2 MG tablet  Commonly known as:  DILAUDID  Take 1-2 tablets (2-4 mg total) by mouth every 4 (four) hours as needed for pain.     mesalamine 1.2 G EC tablet  Commonly known as:  LIALDA  Take 2.4 g by mouth 2 (two) times daily. Two tablets in the morning and Two tablets at bedtime     propranolol 10 MG tablet  Commonly known as:  INDERAL  Take one tablet as directed.  Take 1 with recurrent SVT and may take 2nd 1 20 min later.        FOLLOW UP VISIT:       Follow-up Information   Follow up with SUPPLE,KEVIN M, MD. (call to be seen in 10 -14 days)    Specialty:  Orthopedic Surgery   Contact information:   [address] [place] [address] [phone]       DISCHARGE TO: home  DISPOSITION: Good  DISCHARGE CONDITION:  Rodolph Bong for Dr. Francena Hanly 01/12/2013, 8:21 AM

## 2013-01-13 ENCOUNTER — Encounter (HOSPITAL_COMMUNITY): Payer: Self-pay | Admitting: Orthopedic Surgery

## 2013-02-23 ENCOUNTER — Ambulatory Visit: Payer: 59 | Attending: Orthopedic Surgery

## 2013-02-23 DIAGNOSIS — IMO0001 Reserved for inherently not codable concepts without codable children: Secondary | ICD-10-CM | POA: Insufficient documentation

## 2013-02-23 DIAGNOSIS — M25519 Pain in unspecified shoulder: Secondary | ICD-10-CM | POA: Insufficient documentation

## 2013-02-23 DIAGNOSIS — M25619 Stiffness of unspecified shoulder, not elsewhere classified: Secondary | ICD-10-CM | POA: Insufficient documentation

## 2013-02-23 DIAGNOSIS — R5381 Other malaise: Secondary | ICD-10-CM | POA: Insufficient documentation

## 2013-02-23 DIAGNOSIS — M6281 Muscle weakness (generalized): Secondary | ICD-10-CM | POA: Insufficient documentation

## 2013-02-24 ENCOUNTER — Ambulatory Visit: Payer: 59

## 2013-03-01 ENCOUNTER — Ambulatory Visit: Payer: 59 | Admitting: Physical Therapy

## 2013-03-03 ENCOUNTER — Ambulatory Visit: Payer: 59 | Admitting: Physical Therapy

## 2013-03-08 ENCOUNTER — Ambulatory Visit: Payer: 59

## 2013-03-10 ENCOUNTER — Ambulatory Visit: Payer: 59 | Admitting: Physical Therapy

## 2013-03-15 ENCOUNTER — Ambulatory Visit: Payer: 59

## 2013-03-22 ENCOUNTER — Ambulatory Visit: Payer: 59

## 2013-03-29 ENCOUNTER — Ambulatory Visit: Payer: 59 | Attending: Orthopedic Surgery

## 2013-03-29 DIAGNOSIS — M6281 Muscle weakness (generalized): Secondary | ICD-10-CM | POA: Insufficient documentation

## 2013-03-29 DIAGNOSIS — R5381 Other malaise: Secondary | ICD-10-CM | POA: Insufficient documentation

## 2013-03-29 DIAGNOSIS — M25619 Stiffness of unspecified shoulder, not elsewhere classified: Secondary | ICD-10-CM | POA: Insufficient documentation

## 2013-03-29 DIAGNOSIS — IMO0001 Reserved for inherently not codable concepts without codable children: Secondary | ICD-10-CM | POA: Insufficient documentation

## 2013-03-29 DIAGNOSIS — M25519 Pain in unspecified shoulder: Secondary | ICD-10-CM | POA: Insufficient documentation

## 2013-03-31 ENCOUNTER — Ambulatory Visit: Payer: 59

## 2013-04-05 ENCOUNTER — Ambulatory Visit: Payer: 59

## 2013-04-07 ENCOUNTER — Ambulatory Visit: Payer: 59

## 2013-04-07 ENCOUNTER — Encounter: Payer: Self-pay | Admitting: Family Medicine

## 2013-04-08 NOTE — Telephone Encounter (Signed)
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  Or does she have to come into office to receive it?  Also she would like to know if the office can tell her the date of her last Tetanus vax.  Triage RN did not see it documented in the EMR.  Assured her will send message and someone will f/u with her on both questions.  Agreed to plan.

## 2013-04-11 ENCOUNTER — Ambulatory Visit: Payer: 59 | Admitting: Physical Therapy

## 2013-04-14 ENCOUNTER — Ambulatory Visit: Payer: 59

## 2013-04-20 ENCOUNTER — Ambulatory Visit: Payer: 59

## 2013-06-14 ENCOUNTER — Encounter: Payer: Self-pay | Admitting: Family Medicine

## 2013-12-09 ENCOUNTER — Ambulatory Visit (INDEPENDENT_AMBULATORY_CARE_PROVIDER_SITE_OTHER): Payer: 59 | Admitting: Family Medicine

## 2013-12-09 DIAGNOSIS — Z23 Encounter for immunization: Secondary | ICD-10-CM

## 2014-01-06 ENCOUNTER — Other Ambulatory Visit: Payer: Self-pay

## 2014-05-23 ENCOUNTER — Encounter: Payer: Self-pay | Admitting: Sports Medicine

## 2014-05-23 ENCOUNTER — Ambulatory Visit (INDEPENDENT_AMBULATORY_CARE_PROVIDER_SITE_OTHER): Payer: 59 | Admitting: Sports Medicine

## 2014-05-23 VITALS — BP 120/78 | Ht 60.0 in | Wt 103.0 lb

## 2014-05-23 DIAGNOSIS — M810 Age-related osteoporosis without current pathological fracture: Secondary | ICD-10-CM

## 2014-05-23 DIAGNOSIS — M2021 Hallux rigidus, right foot: Secondary | ICD-10-CM

## 2014-05-23 NOTE — Patient Instructions (Addendum)
  1. For calf cramping, 3 exercises: 1. Knee to chest 2. Pelvic tilt: pull belly button towards spine 3. Back bridge 4. Sids to side holding dumbbells,to help with osteoporosis prevention.   2. Osteoporosis:  1. 30 secs jumping place x3 daily, to help build bone density 2. Vit D 800 I.U. Daily and supplementing with calcium daily   3. Right toes Bone growth: Halux rigidis: need more arch support in shoes by work on pads to add inserts to relieve pressure to reduce bone growth. Also, related to your scoliotic spine causing you to tilt to the left, causing more pressure on your right foot. Please bring back your shoes so we can add inserts.

## 2014-05-23 NOTE — Progress Notes (Signed)
Subjective:     Patient ID: Caitlin Hicks, female   DOB: 12/01/1945, 69 y.o.   MRN: [id number]  HPI  Caitlin Hicks is here today with rhe following complaints:  1. R great toe bony growth: Pt reports that she first noticed it 2-3 months ago but has began to feel more pain over the pat 3-4 weeks. She reports that she has to wear her shoes looser. It has not affected her activity however.   2. Bilateral calf cramping: pt reports that over the past 2 years, she has experience calf cramping when she is laying on her lounge chair (slightly flexed) or while driving. This resolves with activity or squeezing of her muscles. This cramping does not occur while laying flat.   3. Concerns of osteoporosis: pt reports that she has been diagnosed with osteoporosis in the past and would like to prevent this from worsening. She does not report any injuries related to this. She has been taking Vit D 1000I.U. Daily. She has questions regarding the use of calcium supplementation.   Review of Systems     Objective:   Physical Exam  NAD BP 120/78 mmHg  Ht 5' (1.524 m)  Wt 103 lb (46.72 kg)  BMI 20.12 kg/m2  Back: scoliotic curve, shoulder tilt to the left; concave to left in thoracic area.  Cervical spine shows forward flexion of 10 deg LE: Right: Hallux: 15 degrees extension, 15 degrees flexion, LT great toe shows 40 deg ext and 30 deg flex spurring RT and mild left  Lateral column deviation bilaterally, over supination bilaterally Pulses present bilaterally  Back shows good flexion and limited extension  Gait shows that trunk and head shift to left     Assessment:     Caitlin Hicks is a 69 y.o. Female here today with right-sided hallux rigidus - some long term forefoot breakdown  Gait abnormality most likely 2/2 worsening osteoporotic scoliotic curve and resulting left-sided shoulder tilt and collapsing arches.    Plan:     1. For calf cramping, most likely result of sciatic nerve compression and or  lumbar DDD. Provided 3 exercises: 1. Knee to chest 2. Pelvic tilt: pull belly button towards spine 3. Back bridge  2. Osteoporosis: discussed this with patient and recommended: 1. 30 secs jumping place x3 daily, to help build bone density 2. Vit D 800 I.U. Daily and supplementing with calcium daily 3. Side to side holding dumbbells,to help with osteoporosis prevention.  4. Cheek over chest bone exercise  3. Right MTP 1 DJD (Hallux rigidis): need more arch support in shoes, pt will need padded inserts to relieve pressure to reduce spurring Explained to pt that abnormal gait  is related to her scoliotic spine causing you to tilt to the left, causing more pressure on right foot. Asked pt to bring back your shoes so we can add inserts.

## 2014-05-24 DIAGNOSIS — M810 Age-related osteoporosis without current pathological fracture: Secondary | ICD-10-CM | POA: Insufficient documentation

## 2014-05-24 NOTE — Assessment & Plan Note (Signed)
She is not getting calcium and I recommended supplement  Prevention exercises  Exercise to work on posture  Recheck in 3 months and see if gait and trunk position improve

## 2014-05-24 NOTE — Assessment & Plan Note (Signed)
She is to return with multiple shoes  First we should try insole with 1st ray post and medial long arch support  Check gait after this

## 2014-05-29 ENCOUNTER — Encounter: Payer: Self-pay | Admitting: Family Medicine

## 2014-05-29 ENCOUNTER — Ambulatory Visit (INDEPENDENT_AMBULATORY_CARE_PROVIDER_SITE_OTHER): Payer: 59 | Admitting: Family Medicine

## 2014-05-29 VITALS — BP 116/70 | HR 86 | Temp 98.3°F | Wt 106.0 lb

## 2014-05-29 DIAGNOSIS — J329 Chronic sinusitis, unspecified: Secondary | ICD-10-CM

## 2014-05-29 MED ORDER — AMOXICILLIN 875 MG PO TABS
875.0000 mg | ORAL_TABLET | Freq: Two times a day (BID) | ORAL | Status: DC
Start: 2014-05-29 — End: 2015-01-29

## 2014-05-29 NOTE — Patient Instructions (Signed)

## 2014-05-29 NOTE — Progress Notes (Signed)
   Subjective:    Patient ID: Caitlin CarrowRuth L Soper, female    DOB: 01/04/1946, 69 y.o.   MRN: [id number]  HPI Patient seen with intermittent sinusitis symptoms since the Fall. She started actually back in October with some sinus congestion and intermittent headaches. By December symptoms were slightly improved and then she developed what she terms as a general cold in early January and has had some persistent issues since then. She's had bilateral ear fullness. Occasional cough. Frequent nasal congestion. Occasional bloody nasal discharge. Increased malaise. No fever. No teeth pain. She has been very reluctant to consider antibiotics because of her history of ulcerative colitis. Allergic to sulfa  Past Medical History  Diagnosis Date  . Segmental colitis   . Osteoporosis   . Ulcerative colitis   . WPW (Wolff-Parkinson-White syndrome)   . Colitis   . Arthritis     "knees, fingers" (01/11/2013)   Past Surgical History  Procedure Laterality Date  . Tonsillectomy  1948  . Breast biopsy Right 02/22/1995  . Tubal ligation  ~ 1985  . Cataract extraction w/ intraocular lens  implant, bilateral Bilateral 2-05/2011  . Orif proximal humerus fracture Left 01/11/2013  . Inguinal hernia repair Right ~ 1981  . Orif humerus fracture Left 01/11/2013    Procedure: LEFT ORIF PROXIMAL HUMERUS FRACTURE;  Surgeon: Senaida LangeKevin M Supple, MD;  Location: [facility] OR;  Service: Orthopedics;  Laterality: Left;  SAME DAY LABS    reports that she has never smoked. She has never used smokeless tobacco. She reports that she drinks about 1.2 oz of alcohol per week. She reports that she does not use illicit drugs. family history includes Osteoporosis in her mother; Prostate cancer in an other family member. Allergies  Allergen Reactions  . Sulfonamide Derivatives Rash  . Sulfa Antibiotics Rash      Review of Systems  Constitutional: Positive for fatigue. Negative for fever and chills.  HENT: Positive for congestion and sinus  pressure. Negative for facial swelling and sore throat.   Respiratory: Positive for cough.   Neurological: Positive for headaches.       Objective:   Physical Exam  Constitutional: She appears well-developed and well-nourished.  HENT:  Right Ear: External ear normal.  Left Ear: External ear normal.  Mouth/Throat: Oropharynx is clear and moist.  Erythematous nasal mucosa otherwise clear  Neck: Neck supple.  Cardiovascular: Normal rate and regular rhythm.   Pulmonary/Chest: Effort normal and breath sounds normal. No respiratory distress. She has no wheezes. She has no rales.  Lymphadenopathy:    She has no cervical adenopathy.          Assessment & Plan:  Acute recurrent versus chronic sinusitis. Given duration of symptoms start amoxicillin 875 mg twice daily for 10 days. May need improved anaerobic coverage if not improving.

## 2014-05-29 NOTE — Progress Notes (Signed)
Pre visit review using our clinic review tool, if applicable. No additional management support is needed unless otherwise documented below in the visit note. 

## 2014-06-14 ENCOUNTER — Encounter: Payer: Self-pay | Admitting: Sports Medicine

## 2014-06-14 ENCOUNTER — Ambulatory Visit (INDEPENDENT_AMBULATORY_CARE_PROVIDER_SITE_OTHER): Payer: 59 | Admitting: Sports Medicine

## 2014-06-14 VITALS — BP 119/57 | HR 70 | Ht 63.0 in | Wt 102.0 lb

## 2014-06-14 DIAGNOSIS — M2021 Hallux rigidus, right foot: Secondary | ICD-10-CM | POA: Diagnosis not present

## 2014-06-14 DIAGNOSIS — M25511 Pain in right shoulder: Secondary | ICD-10-CM

## 2014-06-14 NOTE — Progress Notes (Signed)
   Subjective:    Patient ID: Caitlin Hicks, female    DOB: 05/20/1945, 69 y.o.   MRN: [id number]  HPI Caitlin MouldingRuth is a 69 year old female who presents for follow-up of right great toe pain. She also has concerns over right shoulder pain. She has a history of right hallux rigidus. She notices pain located at the dorsal lateral aspect of the right great toe. This is been going on for several months. No acute injury. She denies any numbness or tingling. She denies any localized swelling other than a prominent nodule over the dorsum of the right great toe. Symptoms are aggravated with walking. She has not found any relieving factors.  She also has concerns over right shoulder pain. Onset was about 6 weeks ago without any known acute injury. She does exercise regularly including over the head shoulder exercises with weights. Location of pain is primarily the right lateral upper arm. Symptoms are aggravated with overhead activities or if she tries to slowly lower her right abducted arm, she gets a sharp pain at about 90. She denies any weakness, numbness, tingling, neck pain, or radiation. She has not tried anything for this problem.  Past medical history, social history, medications, and allergies were reviewed and are up to date in the chart.  Review of Systems 7 point review of systems was performed and was otherwise negative unless noted in the history of present illness.     Objective:   Physical Exam BP 119/57 mmHg  Pulse 70  Ht 5\' 3"  (1.6 m)  Wt 102 lb (46.267 kg)  BMI 18.07 kg/m2 GEN: The patient is well-developed well-nourished female and in no acute distress.  She is awake alert and oriented x3. SKIN: warm and well-perfused, no rash  EXTR: No lower extremity edema Neuro: Strength 5/5 globally. Sensation intact throughout. No focal deficits. Vasc: +2 bilateral distal pulses. No edema.  MSK: Examination of the bilateral feet reveals hallux rigidus of the right great toe with an osteophyte  located dorsal medially. She has in turning of the lateral toes and calcaneus valgus bilaterally. No localized swelling or bruising. Pain is reproduced with flexion and extension of the right first MTP joint. Examination of the right shoulder reveals full active and passive range of motion in all planes. She has fairly good rotator cuff strength. Slight pain reproduced with Jobes test. Positive Hawkins. Negative speeds. No tenderness at the acromioclavicular joint. Negative Yergason's.     Assessment & Plan:  Please see problem based assessment and plan in the problem list.

## 2014-06-14 NOTE — Assessment & Plan Note (Signed)
Modified insoles of several pairs of her shoes to include bilateral scaphoid padding and medial heel wedges bilaterally. -See how these modifications work -Plan to follow-up in 4-6 weeks or sooner if needed

## 2014-06-14 NOTE — Patient Instructions (Signed)
1. We will see how the orthotic modifications work. If this helps, we can modify your other shoes in a similar manner. 2. For the right shoulder, you have a little rotator cuff irritation.  Some good exercises to reduce pain and inflammation in the shoulder include:  1. Spokes on a wheel, 10 x 3 sets.  2. Palm up/palm down, 10 x 3 sets.             3. Leaning forward, arm hanging down with elbow at your side, pull up keeping elbow at your side (as you have been doing) 10x 3. Use 5-10 lb wt.  If your shoulder pain does not improve with these exercises, we may need to ultrasound your rotator cuff for further evaluation.  Weakness, which causes impingement of the rotator cuff against your acromion can lead to pain at the shoulder with or without a tear. If your pain does not improve, it may suggest a more significant tear that can be treated potentially with medications, ect.  Follow-up in about 3-4 weeks or sooner if needed.

## 2014-06-14 NOTE — Assessment & Plan Note (Signed)
Rotator Cuff Impingement Syndrome. Possibly triggered by overhead weight shoulder presses. -Modify home exercise program -Rotator cuff rehab exercises -follow-up in 4-6 weeks, if shoulder pain not improving, then likely US scan to evaluate cuff

## 2014-06-28 ENCOUNTER — Ambulatory Visit: Payer: 59 | Admitting: Sports Medicine

## 2014-07-25 ENCOUNTER — Ambulatory Visit: Payer: 59 | Admitting: Sports Medicine

## 2014-08-24 ENCOUNTER — Telehealth: Payer: Self-pay | Admitting: *Deleted

## 2014-08-24 NOTE — Telephone Encounter (Signed)
Left message on voicemail to call office. Called to see if had Mammogram  

## 2014-08-25 NOTE — Telephone Encounter (Signed)
Pt call back and said she don't get Mammogram any more.

## 2014-08-25 NOTE — Telephone Encounter (Signed)
Noted and chart updated

## 2014-08-30 ENCOUNTER — Ambulatory Visit (INDEPENDENT_AMBULATORY_CARE_PROVIDER_SITE_OTHER): Payer: 59 | Admitting: Sports Medicine

## 2014-08-30 ENCOUNTER — Encounter: Payer: Self-pay | Admitting: Sports Medicine

## 2014-08-30 VITALS — BP 127/49 | HR 68 | Ht 59.0 in | Wt 102.0 lb

## 2014-08-30 DIAGNOSIS — M179 Osteoarthritis of knee, unspecified: Secondary | ICD-10-CM | POA: Insufficient documentation

## 2014-08-30 DIAGNOSIS — M1711 Unilateral primary osteoarthritis, right knee: Secondary | ICD-10-CM | POA: Insufficient documentation

## 2014-08-30 DIAGNOSIS — M25561 Pain in right knee: Secondary | ICD-10-CM

## 2014-08-30 DIAGNOSIS — M25461 Effusion, right knee: Secondary | ICD-10-CM | POA: Diagnosis not present

## 2014-08-30 DIAGNOSIS — M1710 Unilateral primary osteoarthritis, unspecified knee: Secondary | ICD-10-CM | POA: Insufficient documentation

## 2014-08-30 MED ORDER — TRIAMCINOLONE ACETONIDE 10 MG/ML IJ SUSP
10.0000 mg | Freq: Once | INTRAMUSCULAR | Status: AC
  Administered 2014-08-30: 10 mg via INTRA_ARTICULAR

## 2014-08-30 MED ORDER — DICLOFENAC SODIUM 75 MG PO TBEC: 75.0000 mg | DELAYED_RELEASE_TABLET | Freq: Two times a day (BID) | ORAL | Status: DC

## 2014-08-30 NOTE — Progress Notes (Signed)
Patient ID: Caitlin Hicks, female   DOB: 03-Nov-1945, 69 y.o.   MRN: [id number]  Subjective: Caitlin Hicks is a 69 y.o. 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 She states that yesterday she very suddenly noticed that her right knee spontaneously became swollen and painful. 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 She states that her pain is worst at the lateral aspect of the knee, right around the patella. She denies any preceding trauma or falls, and denies any crepitus, clicking/popping, or instability symptoms. At home she has tried ice, ibuprofen, ace wrap, and finally oxycodone, all with minimal relief of symptoms. She states that this presentation is similar to her previous CPPD flares, but she has never had a flare this severe before, and moreover usually her symptoms are preceded by some mild trauma. Her medical history is unchanged from previous visits except that she has had a recent flareup of her IBD for which she is receiving bovine IgG.   I first diagnosed her with CPPD based on ultrasound and aspiration of fluid that appeared to have crystals in December 2012. Her last flare that I have seen was in 2013. However she does get some mild joint flares for which Voltaren has helped  Objective: Patient in some distress, laying down with knee extended. BP 127/49 mmHg  Pulse 68  Ht  (1.499 m)  Wt 102 lb (46.267 kg)  BMI 20.59 kg/m2/ afebrile  Knee has visible edema and effusion, with no eccyhmosis or erythema, and intact skin.  Patient has significant pain with knee flexion beyond about 20 degrees. Knee is warm throughout. Knee is tender to palpation throughout, worst at the inferior and lateral borders of the patella.  Significant effusion can be palpated  throughout, greatest at the superolateral and inferomedial aspects of the patella.  Pain with varus stress > valgus stress. Negative anterior and posterior drawer.  Negative lachmans.  Deferred Mcmurray's due to pain with knee flexion.  Ultrasound confirms large effusions at the knee, superolateral > inferomedial around the patella. Some calcified bodies are visible in the effusions, but the effusionsare less hypoechoic than typical effusion but and show some snowstorm appearance sometimes seen in CPPD.  Ultrasound guided aspiration of fluid pockets from these two sites reveals bloody-appearing fluid, ~30cc from the superolateral pocket and ~20cc from the inferomedial pocket.   Assessment/Plan: Caitlin Hicks is a 69 y.o. female with a PMH of CPPD and IBD, seen today for acute onset atraumatic right knee pain and effusion. This presentation is somewhat similar to her previous episodes of CPPD, but notably lacks the preceding trauma that she often has before flares, and also is more severe than her usual flare. This presentation may represent a flareup of her CPPD, but other etiologies must be considered. To confirm the diagnosis we have sent her aspirated fluid for further analysis. For now she should get some relief of her symptoms with aspiration of fluid from her effusions. After the aspiration we injected xylocaine, marcaine, and depot-medrol into the two sites where the effusions had formed. Additionally we have written for diclofenac PO and put an ace wrap on her knee to help control the swelling. I have asked her to follow-up at this clinic in 1-2 weeks.  [facility].  Note dictated by Avie ArenasWilliam Runge, MS-4  Reviewed and edited  -  Sibyl Parrkarl B Kyrra Prada, MD

## 2014-08-30 NOTE — Assessment & Plan Note (Signed)
Knee with gross effusion with + ballottement sign.   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  - Pt amenable to arthrocentesis and injection.  As well, will give short term course of Diclofenac 75 mg BID x 7 days - Re-evaluate in 2-3 days   Aspiration/Injection Procedure Note Dalene CarrowRuth L Kepple 10/09/1945  Procedure: Aspiration and Injection Indications: Monoarticular R knee effusion   Procedure Details Consent: Risks of procedure as well as the alternatives and risks of each were explained to the (patient/caregiver).  Consent for procedure obtained. Time Out: Verified patient identification, verified procedure, site/side was marked, verified correct patient position, special equipment/implants available, medications/allergies/relevent history reviewed, required imaging and test results available.  Performed.  The area was cleaned with alcohol swabs.    The lateral suprapatellar and medial knee joint were injected subcutaneously with 4 cc's each of 1% lidocaine.   The area was recleaned with alcohol and an 18 gauge needle was inserted into the suprapatellar lateral knee.  About 30 cc's of blood filled synovial fluid was aspirated and 1 cc Depo 40 mg along with 3 cc 1% lidocaine was injected into the space.  Next, the medial joint space was receleaned and an 18 gauge needle was injected into the medial joint space.  About 15 cc's of blood filled synovial fluid was aspirated and 1 cc Depo 40 mg along with 3 cc 1% lidocaine was injected into the space.  Ultrasound was utilized. Images were obtained in Transverse and Long views showing the needle in the joint space for both aspiration and injection.    Character of Fluid: bloody Fluid was sent ZOX:[id number]:cell count, culture and crystal identification A sterile dressing was applied.  Patient did tolerate procedure well. Estimated blood loss: 2 cc

## 2014-08-31 LAB — BODY FLUID CELL COUNT WITH DIFFERENTIAL
Eos, Fluid: 0 %
LYMPHS FL: 3 %
Monocyte-Macrophage-Serous Fluid: 5 %
NEUTROPHIL FLUID: 92 % — AB (ref 0–25)
Total Nucleated Cell Count, Fluid: 42225 cu mm — ABNORMAL HIGH (ref 0–1000)

## 2014-08-31 LAB — SYNOVIAL FLUID, CRYSTAL

## 2014-09-01 ENCOUNTER — Encounter: Payer: Self-pay | Admitting: Sports Medicine

## 2014-09-01 ENCOUNTER — Ambulatory Visit (INDEPENDENT_AMBULATORY_CARE_PROVIDER_SITE_OTHER): Payer: 59 | Admitting: Sports Medicine

## 2014-09-01 VITALS — BP 122/59 | Ht 59.0 in | Wt 102.0 lb

## 2014-09-01 DIAGNOSIS — M25461 Effusion, right knee: Secondary | ICD-10-CM

## 2014-09-01 NOTE — Assessment & Plan Note (Signed)
Improved after injection/aspiration on 6/8 - Synovial analysis shows CPPD with neutrophilic response - Continue Diclofenac PRN.  No S/Sx of septic joint  - F/U PRN

## 2014-09-01 NOTE — Progress Notes (Signed)
  Caitlin Hicks - 69 y.o. female MRN [id number]  Date of birth: June 01, 1945  SUBJECTIVE:  Including CC & ROS.  Caitlin Hicks is a 69 y.o. female who presents today for f/u R knee pain.    Knee Pain R, f/u for CPPD - Pt seen on 6/8 with worsening monoarticular effusion of the R knee.  Previous Hx of CPPD with recurrent attacks and similar presentation.  She had an US performed showing mixed hypoechoic/hyperechoic areas with large suprapatellar and medial joint effusions.  The joint was prepped and cleaned with aspiration of about 45 cc's total of blood filled synovial fluid and 6 cc's of 1% lidocaine and 2 cc's of 40 mg Depo were injected into the joint.  She was also started on Diclofenac 75 mg BID and f/u in two days.  The synovial fluid was sent for analysis as well.   Today, pain is much improved and denies any fever, chills, or sweats.  Her ROM is much improved and she has taken her Voltaren about 2-3 x since her last visit.  Denies recurrent effusion or any other effusions.  Cannot recall specific incident or trauma to the joint prior to attack.    PMHx - Updated and reviewed.  Contributory factors include: CPPD  PSHx - Updated and reviewed.  Contributory factors include:  None FHx - Updated and reviewed.  Contributory factors include:  None  Medications - Diclofenac 75 mg BID PRN   Exam:  Filed Vitals:   09/01/14 0931  BP: 122/59    Gen: NAD Cardiorespiratory - Normal respiratory effort/rate.  RRR R Knee:  Small effusion still present suprapatellar. No obvious Baker's cysts.  Neg ballottement test  Palpation normal with no warmth or joint line tenderness or patellar tenderness or condyle tenderness.  No TTP along infrapatellar or pes anserine bursas.   ROM normal in flexion (120 degrees) and extension (0 degrees) and lower leg rotation. Ligaments with solid consistent endpoints including ACL, PCL, LCL, MCL.  Negative Anterior Drawer/Lachman/Pivot Shift Negative Mcmurray's and  provocative meniscal tests Patellar and quadriceps tendons unremarkable. Hamstring and quadriceps strength is normal.  Neurovascularly intact B/L LE

## 2014-09-03 LAB — BODY FLUID CULTURE
Gram Stain: NONE SEEN
ORGANISM ID, BACTERIA: NO GROWTH

## 2014-09-06 ENCOUNTER — Encounter: Payer: Self-pay | Admitting: *Deleted

## 2014-09-15 ENCOUNTER — Telehealth: Payer: Self-pay | Admitting: *Deleted

## 2014-09-15 MED ORDER — DICLOFENAC SODIUM 1 % TD GEL: 2.0000 g | Freq: Four times a day (QID) | TRANSDERMAL | Status: DC

## 2014-09-15 NOTE — Telephone Encounter (Signed)
Called in voltaren gel per pt request. Instructed pt to d/c to po version and begin the new topical

## 2014-09-28 ENCOUNTER — Ambulatory Visit (INDEPENDENT_AMBULATORY_CARE_PROVIDER_SITE_OTHER): Payer: 59 | Admitting: Sports Medicine

## 2014-09-28 ENCOUNTER — Ambulatory Visit
Admission: RE | Admit: 2014-09-28 | Discharge: 2014-09-28 | Disposition: A | Discharge status: Home / Self Care | Payer: 59 | Source: Ambulatory Visit | Attending: Sports Medicine | Admitting: Sports Medicine

## 2014-09-28 VITALS — BP 135/66 | Ht 59.0 in | Wt 102.0 lb

## 2014-09-28 DIAGNOSIS — M25561 Pain in right knee: Secondary | ICD-10-CM

## 2014-09-28 DIAGNOSIS — Z8739 Personal history of other diseases of the musculoskeletal system and connective tissue: Secondary | ICD-10-CM

## 2014-09-28 DIAGNOSIS — M1711 Unilateral primary osteoarthritis, right knee: Secondary | ICD-10-CM | POA: Diagnosis not present

## 2014-09-28 MED ORDER — DICLOFENAC SODIUM 1 % TD GEL: 2.0000 g | Freq: Four times a day (QID) | TRANSDERMAL | Status: DC

## 2014-09-28 NOTE — Progress Notes (Signed)
Patient ID: Caitlin Hicks, female   DOB: 05/22/1945, 69 y.o.   MRN: [id number]  CC: R. Knee pain with leg cramps  HPI: Caitlin Hicks is a 69 y.o. female presenting to clinic for follow-up on her R. Knee CPPD. Patient states that she still has discomfort in her knee with associated warmth and swelling. She endorses limping due to pain. States that usually her flares resolve in 2-3 weeks but this flare is not resolving as quickly as she would like. Patient also stating that she is starting to have associated right leg cramps and aches that are becoming bothersome. She usually has to take Tylenol for pain. She says these leg cramps are positional in nature and are worse when she is driving or sleeping. States the pain is 8 out of 10. Patient admits to not taking her Diclofenac by mouth as often that she should have due to not liking to take pills orally. However she did get a prescription for Diclofenac gel which she uses intermittently and feels this helps.  Aspiration last vist showed crystals  Note she has Ulcerative Colitis and takes Mesalamine   Physical Exam: BP 135/66 mmHg  Ht 4\' 11"  (1.499 m)  Wt 102 lb (46.267 kg)  BMI 20.59 kg/m2 General: alert, well-developed, NAD Msk: R. Knee with visible edema. No ecchymosis or erythema. Unable toactively extend knee past 45 but full passive extension/ and flexion limited to 120. Non-tender to palpation. Small effsuion palpated. No warmth. Pulses: Intact Neurologic: No focal deficits, +5 strength globally, sensation grossly intact, A&Ox3.  Skin: Intact without suspicious lesions or rashes. Warm and dry.  Limited R. Knee US: Resolution of large effusions seen ion previous US. Visible calcified meniscus present. Also able to visualize CPPD crystals throughout joint. There is a lateral SPP effusion that is mild.  Meniscal cyst on medial joint lie;  Joint space narrowing.   Assessment/Plan: Patient Active Problem List   Diagnosis Date Noted  .  Effusion of right knee 08/30/2014  . Right shoulder pain 06/14/2014  . Osteoporosis 05/24/2014  . Hallux rigidus of right foot 05/23/2014  . Fracture of humerus, proximal, left, closed 01/12/2013  . WPW (Wolff-Parkinson-White syndrome) 11/24/2012  . Knee pain, left 08/19/2011  . Ulcerative colitis 04/10/2011  . PSEUDOGOUT 04/22/2010  . KNEE PAIN, RIGHT 04/22/2010  . PATELLAR DISLOCATION, RIGHT 04/22/2010  . 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 ABNORMAL ELECTROCARDIOGRAM 01/17/2009     A: Pseudogout of R. knee. Stable without recurrent flare however not fully improved. Leg aching and cramping appears to be associated with chronic knee abnormalities.   P:  -Counseled patient on risk of reoccurrence of flare -Discussed need for controller medication to help fully resolve last flare -Patient instructed to take Diclofenac by mouth daily 1 week then switched to diclofenac gel to use 3-4x daily -Encouraged use of compression sleeve for support  -Right knee x-ray ordered; will contact patient about results  Caryl AdaJazma Phelps, DO 09/28/2014, 2:32 PM   Agree w assessment.  Sterling BigKB Fields, MD PGY-2, [facility]

## 2014-09-28 NOTE — Assessment & Plan Note (Signed)
Xrays confirm OA  I think this knee flares more because of the OA and that triggers both effusions but big effusions come when she gets CPPD flares  compr sleeve  XTrain on bike

## 2014-09-28 NOTE — Patient Instructions (Addendum)
Here are some of the things we discussed today: -Use your Voltaren pill daily for a week and then switch to using the gel 3-4 times a day. -Wear your compression sleeve for support -Knee x-rays were ordered to take a better look at your knee. You will be contacted about those results  Please schedule a follow-up appointment for 1 month.   Pseudogout Pseudogout is similar to gout. It is an arthritis that causes pain, swelling, and inflammation in a joint. This is due to the presence of calcium pyrophosphate crystals in the joint fluid. This is a different type of crystal than the crystals that cause gout. The joint pain can be severe and may last for days. In some cases, it may last much longer and can mimic rheumatoid arthritis or osteoarthritis. CAUSES  The exact cause of the disease is not known. It develops when crystals of calcium pyrophosphate build up in a joint. These crystals cause inflammation that leads to pain and swelling of the joint.  Chances of developing pseudogout increase with age. It often follows a minor injury.  The condition may be passed down from parent to child (hereditary).  Events such as strokes, heart attacks, or surgery may increase the risk of pseudogout.  Pseudogout can be associated with other disorders (hemophilia, ochronosis, amyloidosis, or hormonal disorders).  Pseudogout can be associated with dehydration, especially following surgery or hospitalization. Patients with known pseudogout should stay well hydrated before and after surgery. SYMPTOMS   Intense, constant pain in one joint that seems to come on for no reason.  The joint area may be hot to the touch, red, swollen, and stiff.  Pain may last from several days to a few weeks. It may then disappear. Later, it may start again, possibly in a different joint.  Pseudogout usually affects the knees. It can also affect the wrists, elbows, shoulders, and ankles. DIAGNOSIS  The diagnosis is often  suggested by your exam or is suspected when an abnormal buildup of calcium salts (calcifications) are seen in the cartilage of joints on X-rays. The final diagnosis is made when fluid from the joint is examined under a special microscope used to find calcium pyrophosphate crystals. The crystals of pseudogout and gout may both be present at the same time. TREATMENT  Nonsteroidal anti-inflammatory drugs (NSAIDs), such as naproxen, treat the pain. Identifying the trigger of pseudogout and treating the underlying cause, such as dehydration, is also important. There is no way to remove the crystals themselves. HOME CARE INSTRUCTIONS   Put ice on the sore joint.  Put ice in a plastic bag.  Place a towel between your skin and the bag.  Leave the ice on for 15-20 minutes at a time, 03-04 times a day, for the first 2 days.  Keep your affected joints raised (elevated) when possible to lessen swelling.  Use crutches (non-weight bearing) as needed. Walk without crutches as the pain allows, or as instructed. Gradually, start bearing weight.  Only take over-the-counter or prescription medicines for pain, discomfort, or fever as directed by your caregiver.  Once you recover from the painful attack, exercise regularly to keep your muscle strength. Not using a sore joint will cause the muscles around it to become weak. This may increase pain. Low-impact exercises, such as swimming, bicycling, water aerobics, and walking, may be best. This will give you energy, strengthen your heart, help you control your weight, and improve your well-being.  Maintain a healthy weight so your joints do not need to  bear more weight than necessary. SEEK MEDICAL CARE IF:   You have an increase in joint pain not relieved with medicine.  You have a fever.  You have more serious symptoms such as skin rash, diarrhea, vomiting, headache, or other joint pains. FOR MORE INFORMATION  Arthritis Foundation:  www.arthritis.[facility] of Arthritis and Musculoskeletal and Skin Diseases: www.niams.http://www.myers.net/ Document Released: 12/01/2003 Document Revised: 06/02/2011 Document Reviewed: 06/22/2009 [facility] Patient Information 2015 [place], [place]. This information is not intended to replace advice given to you by your health care provider. Make sure you discuss any questions you have with your health care provider.

## 2014-09-28 NOTE — Assessment & Plan Note (Signed)
Needs more aggressive use of NSAIDs for her RT knee  Not good candidate for long term oral NSAIDs or for colchicine wite her hx of UC

## 2014-10-09 ENCOUNTER — Telehealth: Payer: Self-pay | Admitting: *Deleted

## 2014-10-09 NOTE — Telephone Encounter (Signed)
I'd recommend she take the diclofenac BID for now and use voltaren gel to the area PRN.  If this does not work, I would be glad to call in a steroid taper.  Thanks Starbucks CorporationBryan

## 2014-10-09 NOTE — Telephone Encounter (Signed)
Spoke with pt and she will increase the oral diclofenac to BID and apply topical gel as needed.  Will call if not any better in a few days.

## 2014-10-09 NOTE — Telephone Encounter (Signed)
-----   Message from Melanie L Ceresi sent at 10/09/2014  8:24 AM EDT ----- Regarding: phone message Contact: [phone] Please call pt she is having knee swelling and pain. Her appt is next week. She needs advice on what to do. 

## 2014-10-09 NOTE — Telephone Encounter (Signed)
-----   Message from Lizbeth BarkMelanie L Ceresi sent at 10/09/2014  8:24 AM EDT ----- Regarding: phone message Contact: [phone] Please call pt she is having knee swelling and pain. Her appt is next week. She needs advice on what to do.

## 2014-10-09 NOTE — Telephone Encounter (Signed)
Pt called about knee pain.  Is there something you would recommend until her appt next week?

## 2014-10-10 ENCOUNTER — Other Ambulatory Visit: Payer: Self-pay | Admitting: Family Medicine

## 2014-10-19 ENCOUNTER — Ambulatory Visit (INDEPENDENT_AMBULATORY_CARE_PROVIDER_SITE_OTHER): Payer: 59 | Admitting: Sports Medicine

## 2014-10-19 VITALS — BP 123/55

## 2014-10-19 DIAGNOSIS — M6798 Unspecified disorder of synovium and tendon, other site: Secondary | ICD-10-CM | POA: Diagnosis not present

## 2014-10-19 DIAGNOSIS — Z8739 Personal history of other diseases of the musculoskeletal system and connective tissue: Secondary | ICD-10-CM | POA: Diagnosis not present

## 2014-10-19 DIAGNOSIS — M67951 Unspecified disorder of synovium and tendon, right thigh: Secondary | ICD-10-CM

## 2014-10-19 NOTE — Assessment & Plan Note (Addendum)
-  Counseled patient on risk of reoccurrence of flare -Patient instructed to take Diclofenac by mouth daily for prolonged period of time possibly up to 6 months along with diclofenac gel to use 3-4x daily -Encouraged use of compression sleeve for support  -Also discussed possible controller medications including experimental use with colchicine versus plaquenelle. If her exacerbations become more frequent these are possible options which we would consider.

## 2014-10-19 NOTE — Progress Notes (Signed)
Patient ID: Caitlin Hicks, female   DOB: 1946-01-26, 69 y.o.   MRN: [id number]  CC: Pseudogout and right hip pain  HPI: Caitlin Hicks is a 69 y.o. female presenting to clinic for follow-up on her R. Knee CPPD. Patient states that she still has discomfort in her knee with associated warmth and swelling. She endorses limping due to pain. She previously had a knee aspiration on June 2016 with intra-articular steroid injection and medial knee steroid injection. She was seen in the beginning of July for ongoing effusion of the right knee with pain. At that time she was placed on Voltaren gel when necessary along with diclofenac 75 mg once daily. Since that time she is noted that the effusion has decreased but is still there. Her pain has greatly improved at this time and she is able to ambulate without limping. Denies any paresthesias or injury from this episode to the right knee.  Right hip pain-this is been ongoing now for 3 weeks and is located on the right lateral hip. She thinks it secondary to her antalgic gait from her knee. Pain is worse with any type of palpation or laying on that side. It has improved over the last week in which her gait has started return to normal. She has not tried anything for this. Denies any paresthesias going distally. No swelling or ecchymosis in this region. Pain is achy or sharp when touched. Worse with abduction of the hip.  Note she has Ulcerative Colitis and takes Mesalamine  Past medical, surgical, family, and medications reviewed.  Physical Exam: BP 123/55 mmHg General: alert, well-developed, NAD Msk: R. Knee with visible effusion. No ecchymosis or erythema. Active flexion of knee 100  but full passive extension/ and flexion limited to 120. Non-tender to palpation. Small effsuion palpated. Minimal warmth. Hip: Right abduction weakness compared to the left. All other hip muscle strength is equal bilateral. Pulses: Intact Neurologic: No focal deficits, +5  strength globally, sensation grossly intact, A&Ox3.  Skin: Intact without suspicious lesions or rashes. Warm and dry.

## 2014-10-19 NOTE — Assessment & Plan Note (Signed)
Weakness on exam or point tenderness of the gluteus medius on the right side. This is secondary to her antalgic gait from the right knee effusion from pseudogout. -Improving, hip abduction exercises given the patient to work on. Voltaren when necessary  for pain.

## 2014-10-25 ENCOUNTER — Other Ambulatory Visit: Payer: Self-pay | Admitting: Dermatology

## 2014-11-13 ENCOUNTER — Other Ambulatory Visit: Payer: Self-pay | Admitting: *Deleted

## 2014-11-13 MED ORDER — DICLOFENAC SODIUM 75 MG PO TBEC: 75.0000 mg | DELAYED_RELEASE_TABLET | Freq: Two times a day (BID) | ORAL | Status: DC

## 2015-01-10 ENCOUNTER — Telehealth: Payer: Self-pay | Admitting: Internal Medicine

## 2015-01-10 NOTE — Telephone Encounter (Signed)
Pt is a 69 yo F w/ a PMH significant for WPW previously followed by EP but not seen since 2011. Has declined ablation of accessory pathway in the past. Prescribed propranolol PRN tachycardia by EP. Developed palpitations a few hours ago and noted regular pulse 140-160 bpm. Minimally symptomatic. Has taken propranolol 20 mg PO X 3 w/o resolution. Spoke to pt on phone and advised her to present to closest ER and not to take any more propranolol. Explained with some SVTs may terminate but with others (i.e. Afib), beta-blocker may increase conduction down accessory pathway and could generate into VF. Also if she were to break and go back into NSR so much beta-blocker could make HR dangerously slow. Pt verbalized understanding but will stay home for a few more hours to see if tachycardia terminates. Message routed to primary EP.  Glenford BayleyAndrew Erron Wengert, MD

## 2015-01-11 ENCOUNTER — Telehealth: Payer: Self-pay | Admitting: Internal Medicine

## 2015-01-11 NOTE — Telephone Encounter (Signed)
New Message    Pt called to be a new pt on Dr. Graciela HusbandsKlein schedule   She states that she needs her medications refilled.  Please give pt a call back, thank you.

## 2015-01-26 ENCOUNTER — Encounter: Payer: 59 | Admitting: Internal Medicine

## 2015-01-29 ENCOUNTER — Ambulatory Visit (INDEPENDENT_AMBULATORY_CARE_PROVIDER_SITE_OTHER): Payer: BLUE CROSS/BLUE SHIELD | Admitting: Internal Medicine

## 2015-01-29 ENCOUNTER — Encounter: Payer: Self-pay | Admitting: Internal Medicine

## 2015-01-29 VITALS — BP 128/76 | HR 63 | Ht 59.0 in | Wt 103.2 lb

## 2015-01-29 DIAGNOSIS — I456 Pre-excitation syndrome: Secondary | ICD-10-CM

## 2015-01-29 MED ORDER — PROPRANOLOL HCL 10 MG PO TABS: ORAL_TABLET | ORAL | Status: DC

## 2015-01-29 NOTE — Progress Notes (Signed)
ELECTROPHYSIOLOGY CONSULT NOTE  Patient ID: Caitlin Hicks, MRN: [id number], DOB/AGE: 1946-02-26 69 y.o. Admit date: (Not on file) Date of Consult: 01/29/2015  Primary Physician: [name], MD Primary Cardiologist: sk  Chief Complaint: WPW   HPI Caitlin Hicks is a 69 y.o. female  Seen to reestablish for WPW. She was last seen 2010. At that time she had a couple of interval episodes. She is being treated with beta blockers.   last couple of years she has had increasing frequency of tachycardia abuse lasting up to 4 hours. She's had numerous other presyncopal events that are abrupt in onset some of which are insulin which are not followed by typical tachycardia.  She also describes an episode 2 years ago where she was standing on a stool admitting without recollection and without any evidence of head trauma found herself having fallen to the floor and breaking her arm.   Echocardiogram 11/10 was normal  Past Medical History  Diagnosis Date  . Segmental colitis (HCC)   . Osteoporosis   . Ulcerative colitis   . WPW (Wolff-Parkinson-White syndrome)   . Colitis   . Arthritis     "knees, fingers" (01/11/2013)      Surgical History:  Past Surgical History  Procedure Laterality Date  . Tonsillectomy  1948  . Breast biopsy Right 02/22/1995  . Tubal ligation  ~ 1985  . Cataract extraction w/ intraocular lens  implant, bilateral Bilateral 2-05/2011  . Orif proximal humerus fracture Left 01/11/2013  . Inguinal hernia repair Right ~ 1981  . Orif humerus fracture Left 01/11/2013    Procedure: LEFT ORIF PROXIMAL HUMERUS FRACTURE;  Surgeon: Senaida Lange, MD;  Location: [facility] OR;  Service: Orthopedics;  Laterality: Left;  SAME DAY LABS     Home Meds: Prior to Admission medications   Medication Sig Start Date End Date Taking? Authorizing Provider  diclofenac (VOLTAREN) 75 MG EC tablet Take 1 tablet (75 mg total) by mouth 2 (two) times daily. Take as needed for pain. 11/13/14   Yes Bryan R Hess, DO  diclofenac sodium (VOLTAREN) 1 % GEL Apply 2 g topically 4 (four) times daily. 09/28/14  Yes Pincus Large, DO  mesalamine (LIALDA) 1.2 G EC tablet Take 2.4 g by mouth 2 (two) times daily. Two tablets in the morning and Two tablets at bedtime   Yes Historical Provider, MD  propranolol (INDERAL) 10 MG tablet Take one tablet as directed.  Take 1 with recurrent SVT and may take 2nd 1 20 min later. 11/24/12  Yes [name], MD    Allergies:  Allergies  Allergen Reactions  . Sulfonamide Derivatives Rash  . Sulfa Antibiotics Rash    Social History   Social History  . Marital Status: Married    Spouse Name: N/A  . Number of Children: N/A  . Years of Education: N/A   Occupational History  . Not on file.   Social History Main Topics  . Smoking status: Never Smoker   . Smokeless tobacco: Never Used  . Alcohol Use: 1.2 oz/week    2 Glasses of wine per week     Comment: 01/11/2013 "2-3 glasses of wine/wk; some weeks not even that"  . Drug Use: No  . Sexual Activity: Yes   Other Topics Concern  . Not on file   Social History Narrative   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           Family History  Problem  Relation Age of Onset  . Osteoporosis Mother   . Prostate cancer       ROS:  Please see the history of present illness.     All other systems reviewed and negative.    Physical Exam: Blood pressure 128/76, pulse 63, height 4\' 11"  (1.499 m), weight 103 lb 3.2 oz (46.811 kg). General: Well developed, well nourished female in no acute distress. Head: Normocephalic, atraumatic, sclera non-icteric, no xanthomas, nares are without discharge. EENT: normal  Lymph Nodes:  none Neck: Negative for carotid bruits. JVD not elevated. Back:without scoliosis kyphosis Lungs: Clear bilaterally to auscultation without wheezes, rales, or rhonchi. Breathing is unlabored. Heart: RRR with loud S1 S2. No murmur . No rubs, or gallops appreciated. Abdomen: Soft,  non-tender, non-distended with normoactive bowel sounds. No hepatomegaly. No rebound/guarding. No obvious abdominal masses. Msk:  Strength and tone appear normal for age. Extremities: No clubbing or cyanosis. No edema.  Distal pedal pulses are 2+ and equal bilaterally. Skin: Warm and Dry Neuro: Alert and oriented X 3. CN III-XII intact Grossly normal sensory and motor function . Psych:  Responds to questions appropriately with a normal affect.      Labs: Cardiac Enzymes No results for input(s): CKTOTAL, CKMB, TROPONINI in the last 72 hours. CBC Lab Results  Component Value Date   WBC 4.7 01/11/2013   HGB 11.8* 01/11/2013   HCT 36.6 01/11/2013   MCV 87.4 01/11/2013   PLT 206 01/11/2013   PROTIME: No results for input(s): LABPROT, INR in the last 72 hours. Chemistry No results for input(s): NA, K, CL, CO2, BUN, CREATININE, CALCIUM, PROT, BILITOT, ALKPHOS, ALT, AST, GLUCOSE in the last 168 hours.  Invalid input(s): LABALBU Lipids Lab Results  Component Value Date   CHOL 161 11/11/2012   HDL 51.10 11/11/2012   LDLCALC 94 11/11/2012   TRIG 82.0 11/11/2012   BNP No results found for: PROBNP Thyroid Function Tests: No results for input(s): TSH, T4TOTAL, T3FREE, THYROIDAB in the last 72 hours.  Invalid input(s): FREET3 Miscellaneous No results found for: DDIMER  Radiology/Studies:  No results found.  EKG: * Sinus rhythm at 63 Intervals 01/31/41 Ventricular preexcitation with a Pattern consistent with an anteroseptal pathway    Assessment and Plan:  WPW  Septal pathway  Syncope  Recurrent presyncope and tachycardia  Suggest  That the syncope and the WPW also are probably related.  There has been an increasing frequency of events  Hence, I think pursuing ablation of her pathway at this juncture given the increasing symptoms is appropriate. We will arrange for her to meet with Dr. Ladona Ridgelaylor in anticipation as the ECG algorithm suggests a pathway  On the septum somewhere  between mid anteroseptal. Discussion would need to include strategies the event that  The pathway were quite proximate to the AV node.  She is agreeable  More than 50% of 45 min was spent in counseling related to the above   Sherryl MangesSteven Broox Lonigro

## 2015-01-29 NOTE — Patient Instructions (Signed)
Medication Instructions: - no changes - Inderal refill has been sent in to the pharmacy  Labwork: - none  Procedures/Testing: - none  Follow-Up: - You have been referred to Dr. Ladona Ridgelaylor for consider of WPW ablation  Any Additional Special Instructions Will Be Listed Below (If Applicable). - none

## 2015-02-20 ENCOUNTER — Encounter: Payer: Self-pay | Admitting: Internal Medicine

## 2015-02-20 ENCOUNTER — Ambulatory Visit (INDEPENDENT_AMBULATORY_CARE_PROVIDER_SITE_OTHER): Payer: BLUE CROSS/BLUE SHIELD | Admitting: Internal Medicine

## 2015-02-20 VITALS — BP 122/74 | HR 73 | Ht 59.5 in | Wt 104.2 lb

## 2015-02-20 DIAGNOSIS — I456 Pre-excitation syndrome: Secondary | ICD-10-CM

## 2015-02-20 NOTE — Assessment & Plan Note (Addendum)
I have discussed the treatment options with the patient. The risks/benefits/goals/expectations of catheter ablation were reviewed. She currenty dose not think that her symptoms are severe enough to warrant an ablation. She will undergo watchful waiting. I discussed the treatmento options with the patient for an hour formulating a plan of action.

## 2015-02-20 NOTE — Patient Instructions (Signed)
Medication Instructions:  Your physician recommends that you continue on your current medications as directed. Please refer to the Current Medication list given to you today.   Labwork: None ordered    Testing/Procedures: None ordered     Follow-Up: Your physician recommends that you schedule a follow-up appointment in: April of 2017 with Dr Ladona Ridgelaylor   Any Other Special Instructions Will Be Listed Below (If Applicable).     If you need a refill on your cardiac medications before your next appointment, please call your pharmacy.

## 2015-02-20 NOTE — Progress Notes (Signed)
HPI Caitlin Hicks is referred today by Dr. 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 She is a pleasant 69 yo woman with longstanding h/o WPW syndrome for which she has been minimally symptomatic until the past few months. She takes metoprolol to control her symptoms. She has had documented SVT at 170-180/min. She takes additional beta blockers and her symptoms improve. She has never had syncope associated with her SVT. She does note spells where she feels like her heart is slowing down and gets lightheaded. 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 She does note a single episode of syncope occuring when she was standing on her bar stool. This was not associated with palpitations. Allergies  Allergen Reactions  . Sulfonamide Derivatives Rash  . Sulfa Antibiotics Rash     Current Outpatient Prescriptions  Medication Sig Dispense Refill  . diclofenac (VOLTAREN) 75 MG EC tablet Take 1 tablet (75 mg total) by mouth 2 (two) times daily. Take as needed for pain. 30 tablet 0  . diclofenac sodium (VOLTAREN) 1 % GEL Apply 2 g topically 4 (four) times daily. 50 g 1  . mesalamine (LIALDA) 1.2 G EC tablet Take 2.4 g by mouth 2 (two) times daily. Two tablets in the morning and Two tablets at bedtime    . propranolol (INDERAL) 10 MG tablet Take one tablet (10 mg) by mouth as needed for rapid heart rate- may take up to 4 tablets as needed over 2 hours 30 tablet 3   No current facility-administered medications for this visit.     Past Medical History  Diagnosis Date  . Segmental colitis (HCC)   . Osteoporosis   . Ulcerative colitis   . WPW (Wolff-Parkinson-White syndrome)   . Colitis   . Arthritis     "knees, fingers" (01/11/2013)    ROS:   All systems reviewed and negative except as noted in the HPI.   Past Surgical History  Procedure Laterality Date  . Tonsillectomy  1948  . Breast biopsy Right 02/22/1995  . Tubal ligation  ~ 1985  . Cataract extraction w/ intraocular  lens  implant, bilateral Bilateral 2-05/2011  . Orif proximal humerus fracture Left 01/11/2013  . Inguinal hernia repair Right ~ 1981  . Orif humerus fracture Left 01/11/2013    Procedure: LEFT ORIF PROXIMAL HUMERUS FRACTURE;  Surgeon: Senaida Lange, MD;  Location: [facility] OR;  Service: Orthopedics;  Laterality: Left;  SAME DAY LABS     Family History  Problem Relation Age of Onset  . Osteoporosis Mother   . Prostate cancer       Social History   Social History  . Marital Status: Married    Spouse Name: N/A  . Number of Children: N/A  . Years of Education: N/A   Occupational History  . Not on file.   Social History Main Topics  . Smoking status: Never Smoker   . Smokeless tobacco: Never Used  . Alcohol Use: 1.2 oz/week    2 Glasses of wine per week     Comment: 01/11/2013 "2-3 glasses of wine/wk; some weeks not even that"  . Drug Use: No  . Sexual Activity: Yes   Other Topics Concern  . Not on file   Social History Narrative   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           BP 122/74 mmHg  Pulse 73  Ht 4' 11.5" (1.511 m)  Wt 104 lb 3.2 oz (47.265  kg)  BMI 20.70 kg/m2  Physical Exam:  Well appearing NAD HEENT: Unremarkable Neck:  No JVD, no thyromegally Lymphatics:  No adenopathy Back:  No CVA tenderness Lungs:  Clear with no wheezes HEART:  Regular rate rhythm, no murmurs, no rubs, no clicks Abd:  soft, positive bowel sounds, no organomegally, no rebound, no guarding Ext:  2 plus pulses, no edema, no cyanosis, no clubbing Skin:  No rashes no nodules Neuro:  CN II through XII intact, motor grossly intact  EKG - nsr with with WPW    Assess/Plan:

## 2015-05-22 ENCOUNTER — Other Ambulatory Visit (INDEPENDENT_AMBULATORY_CARE_PROVIDER_SITE_OTHER): Payer: BLUE CROSS/BLUE SHIELD

## 2015-05-22 DIAGNOSIS — Z0000 Encounter for general adult medical examination without abnormal findings: Secondary | ICD-10-CM

## 2015-05-22 LAB — BASIC METABOLIC PANEL
BUN: 17 mg/dL (ref 6–23)
CHLORIDE: 107 meq/L (ref 96–112)
CO2: 27 mEq/L (ref 19–32)
Calcium: 9.7 mg/dL (ref 8.4–10.5)
Creatinine, Ser: 0.76 mg/dL (ref 0.40–1.20)
GFR: 79.51 mL/min (ref >60.00)
Glucose, Bld: 88 mg/dL (ref 70–99)
POTASSIUM: 4.5 meq/L (ref 3.5–5.1)
Sodium: 141 mEq/L (ref 135–145)

## 2015-05-22 LAB — CBC WITH DIFFERENTIAL/PLATELET
BASOS PCT: 0.5 % (ref 0.0–3.0)
Basophils Absolute: 0 10*3/uL (ref 0.0–0.1)
EOS PCT: 2.1 % (ref 0.0–5.0)
Eosinophils Absolute: 0.1 10*3/uL (ref 0.0–0.7)
HEMATOCRIT: 39.8 % (ref 36.0–46.0)
Hemoglobin: 13.2 g/dL (ref 12.0–15.0)
LYMPHS PCT: 25.7 % (ref 12.0–46.0)
Lymphs Abs: 1.3 10*3/uL (ref 0.7–4.0)
MCHC: 33.1 g/dL (ref 30.0–36.0)
MCV: 84.1 fl (ref 78.0–100.0)
MONOS PCT: 7.7 % (ref 3.0–12.0)
Monocytes Absolute: 0.4 10*3/uL (ref 0.1–1.0)
Neutro Abs: 3.1 10*3/uL (ref 1.4–7.7)
Neutrophils Relative %: 64 % (ref 43.0–77.0)
Platelets: 253 10*3/uL (ref 150.0–400.0)
RBC: 4.74 Mil/uL (ref 3.87–5.11)
RDW: 16.6 % — AB (ref 11.5–15.5)
WBC: 4.9 10*3/uL (ref 4.0–10.5)

## 2015-05-22 LAB — HEPATIC FUNCTION PANEL
ALT: 15 U/L (ref 0–35)
AST: 18 U/L (ref 0–37)
Albumin: 4.4 g/dL (ref 3.5–5.2)
Alkaline Phosphatase: 79 U/L (ref 39–117)
Bilirubin, Direct: 0.1 mg/dL (ref 0.0–0.3)
TOTAL PROTEIN: 6.6 g/dL (ref 6.0–8.3)
Total Bilirubin: 0.6 mg/dL (ref 0.2–1.2)

## 2015-05-22 LAB — LIPID PANEL
CHOLESTEROL: 179 mg/dL (ref 0–200)
HDL: 50.2 mg/dL (ref >39.00)
LDL CALC: 106 mg/dL — AB (ref 0–99)
NonHDL: 128.95
Total CHOL/HDL Ratio: 4
Triglycerides: 117 mg/dL (ref 0.0–149.0)
VLDL: 23.4 mg/dL (ref 0.0–40.0)

## 2015-05-22 LAB — TSH: TSH: 3.98 u[IU]/mL (ref 0.35–4.50)

## 2015-05-24 ENCOUNTER — Other Ambulatory Visit: Payer: BLUE CROSS/BLUE SHIELD

## 2015-05-29 ENCOUNTER — Ambulatory Visit (INDEPENDENT_AMBULATORY_CARE_PROVIDER_SITE_OTHER): Payer: BLUE CROSS/BLUE SHIELD | Admitting: Family Medicine

## 2015-05-29 VITALS — BP 120/78 | HR 86 | Temp 97.9°F | Ht 59.5 in | Wt 104.6 lb

## 2015-05-29 DIAGNOSIS — Z0000 Encounter for general adult medical examination without abnormal findings: Secondary | ICD-10-CM | POA: Diagnosis not present

## 2015-05-29 NOTE — Progress Notes (Signed)
Pre visit review using our clinic review tool, if applicable. No additional management support is needed unless otherwise documented below in the visit note. 

## 2015-05-29 NOTE — Progress Notes (Signed)
Subjective:    Patient ID: Caitlin Hicks, female    DOB: 04/21/1945, 70 y.o.   MRN: [id number]  HPI  patient here for physical exam. Generally very healthy.  She still works as a Engineer, civil (consulting)nurse for the [employer] but plans to retire later this year.  she has history of with Parkinson White syndrome. She has declined ablation procedure. She takes Inderal as needed for flareups.   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 No current problems.  She walks several days per week for exercise.  No recent falls. No balance issues. Never smoked.  Immunizations up-to-date.   Declines further bone density testing or mammograms.  Past Medical History  Diagnosis Date  . Segmental colitis (HCC)   . Osteoporosis   . Ulcerative colitis   . WPW (Wolff-Parkinson-White syndrome)   . Colitis   . Arthritis     "knees, fingers" (01/11/2013)   Past Surgical History  Procedure Laterality Date  . Tonsillectomy  1948  . Breast biopsy Right 02/22/1995  . Tubal ligation  ~ 1985  . Cataract extraction w/ intraocular lens  implant, bilateral Bilateral 2-05/2011  . Orif proximal humerus fracture Left 01/11/2013  . Inguinal hernia repair Right ~ 1981  . Orif humerus fracture Left 01/11/2013    Procedure: LEFT ORIF PROXIMAL HUMERUS FRACTURE;  Surgeon: Senaida LangeKevin M Supple, MD;  Location: [facility] OR;  Service: Orthopedics;  Laterality: Left;  SAME DAY LABS    reports that she has never smoked. She has never used smokeless tobacco. She reports that she drinks about 1.2 oz of alcohol per week. She reports that she does not use illicit drugs. family history includes Osteoporosis in her mother. Allergies  Allergen Reactions  . Sulfonamide Derivatives Rash  . Sulfa Antibiotics Rash      Review of Systems  Constitutional: Negative for fever, activity change, appetite change, fatigue and unexpected weight change.  HENT: Negative for ear pain, hearing loss, sore throat and  trouble swallowing.   Eyes: Negative for visual disturbance.  Respiratory: Negative for cough and shortness of breath.   Cardiovascular: Negative for chest pain and palpitations.  Gastrointestinal: Negative for abdominal pain, diarrhea, constipation and blood in stool.  Genitourinary: Negative for dysuria and hematuria.  Musculoskeletal: Negative for myalgias, back pain and arthralgias.  Skin: Negative for rash.  Neurological: Negative for dizziness, syncope and headaches.  Hematological: Negative for adenopathy.  Psychiatric/Behavioral: Negative for confusion and dysphoric mood.       Objective:   Physical Exam  Constitutional: She is oriented to person, place, and time. She appears well-developed and well-nourished.  HENT:  Head: Normocephalic and atraumatic.  Eyes: EOM are normal. Pupils are equal, round, and reactive to light.  Neck: Normal range of motion. Neck supple. No thyromegaly present.  Cardiovascular: Normal rate, regular rhythm and normal heart sounds.   No murmur heard. Pulmonary/Chest: Breath sounds normal. No respiratory distress. She has no wheezes. She has no rales.  Abdominal: Soft. Bowel sounds are normal. She exhibits no distension and no mass. There is no tenderness. There is no rebound and no guarding.  Musculoskeletal: Normal range of motion. She exhibits no edema.  Lymphadenopathy:    She has no cervical adenopathy.  Neurological: She is alert and oriented to person, place, and time. She displays normal reflexes. No cranial nerve deficit.  Skin: No rash noted.  Psychiatric: She has a normal mood and affect. Her behavior is normal. Judgment and thought content normal.  Assessment & Plan:   Physical exam. Labs reviewed. No major concerns. Immunizations up-to-date. She declines further screening with mammography or DEXA scan. Continue regular calcium and vitamin D supplementation and regular weightbearing exercise.

## 2015-08-19 IMAGING — CR DG KNEE 1-2V*R*
2 series · 2 of 2 positions shown · non-contrast
Comparison: 11/22/2004

CLINICAL DATA: Right knee pain for 1 month, history of
osteoarthritis

EXAM:
RIGHT KNEE - 1-2 VIEW

[w knee ap right]
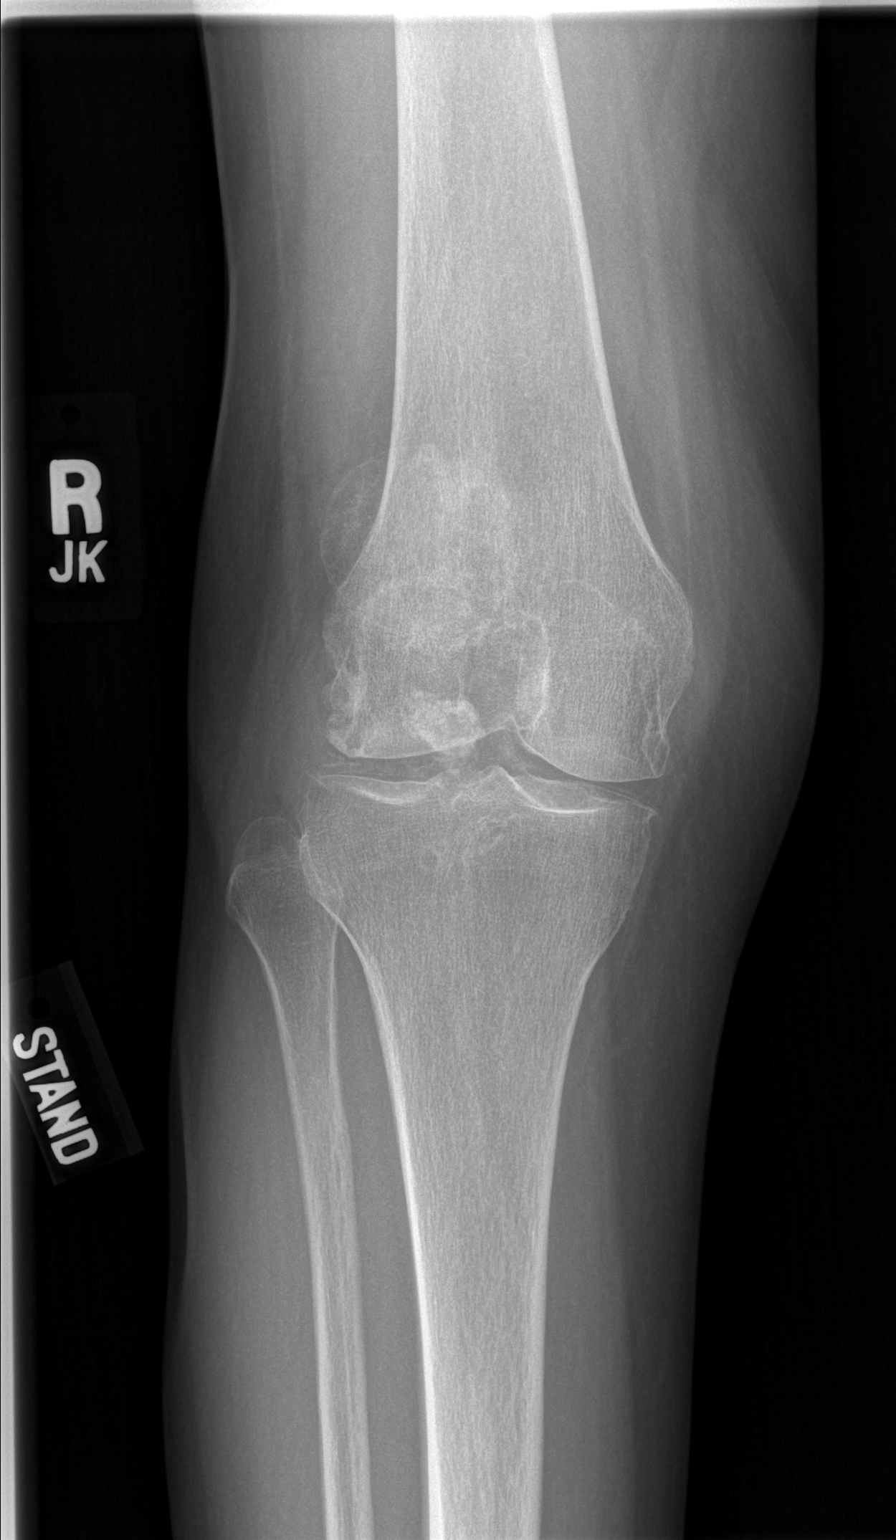

[w knee lat. right]
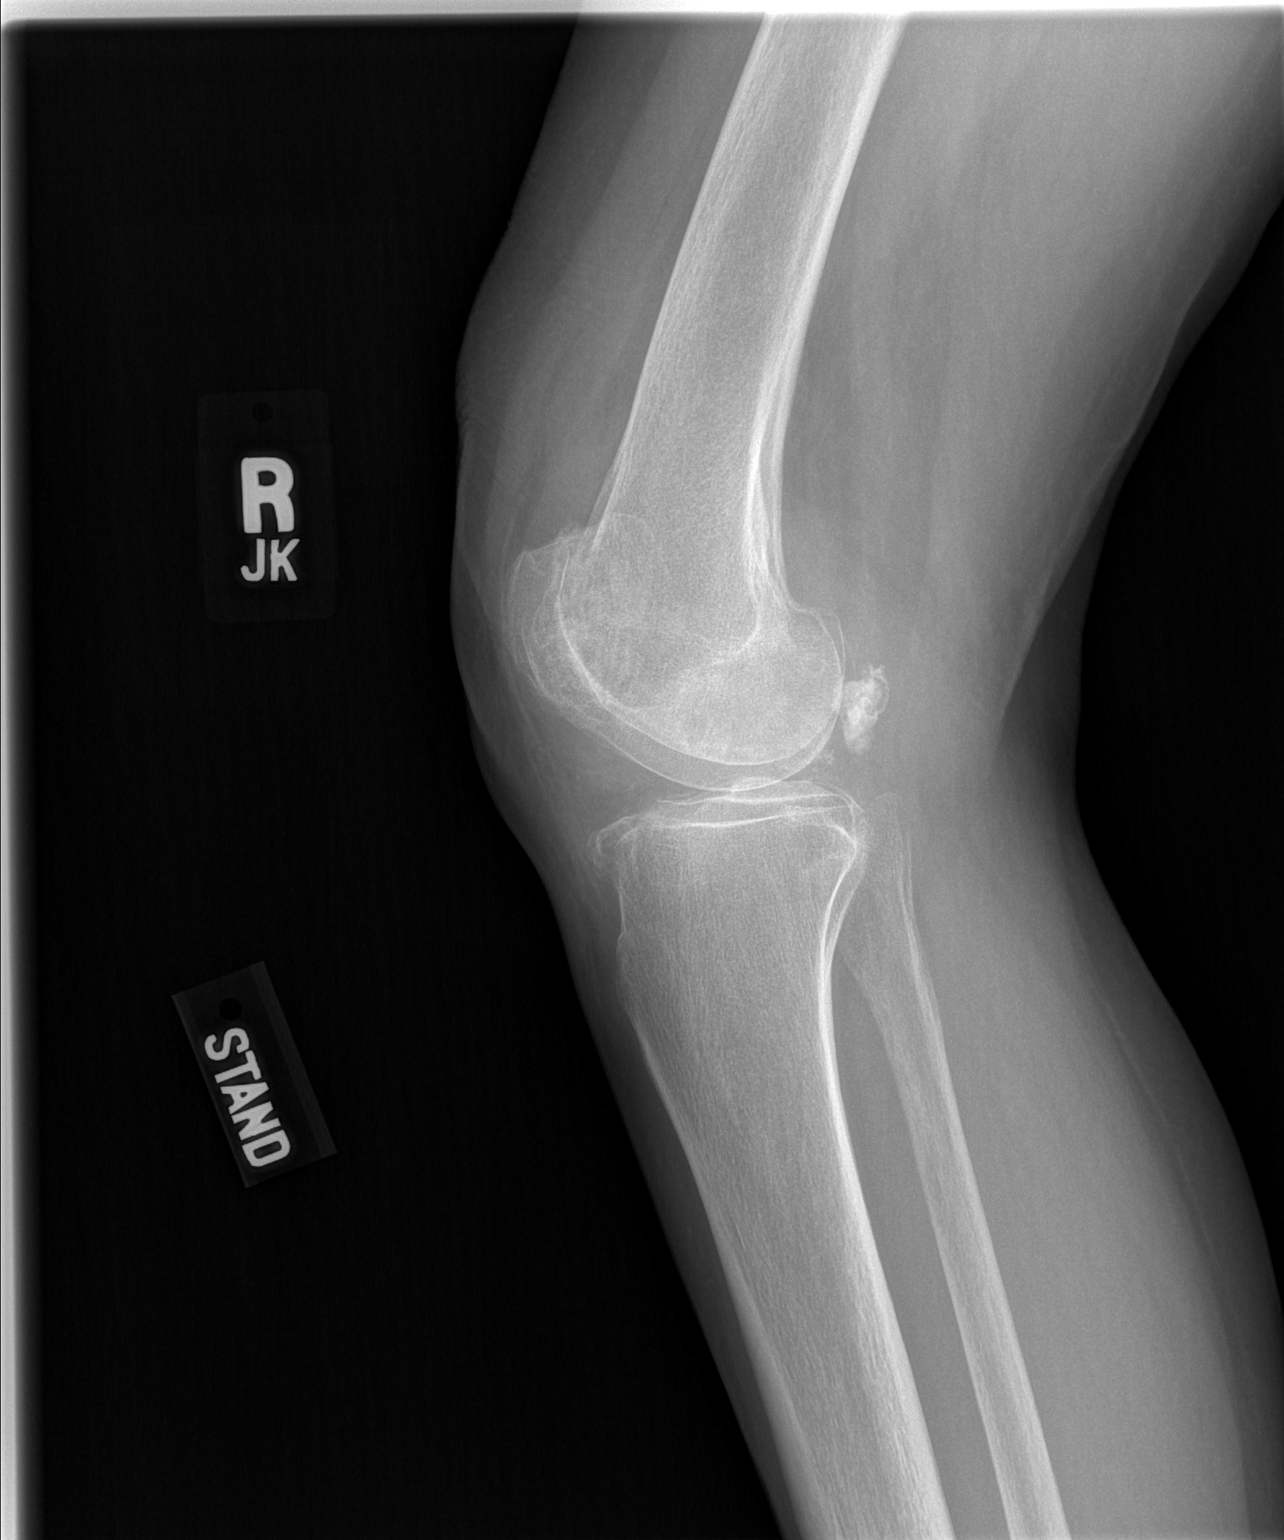

[2 of 2 positions shown; findings below may reference images not displayed]

FINDINGS: Two views of the right knee submitted. There is diffuse osteopenia.
Mild narrowing of medial joint compartment. Chondrocalcinosis again
noted. Significant narrowing of patellofemoral joint space. Small
joint effusion. Again noted calcified loose body in the posterior
joint space. No acute fracture or subluxation.
IMPRESSION: No acute fracture or subluxation. Osteoarthritic changes as
described above. Chondrocalcinosis again noted. Diffuse osteopenia.

## 2015-09-11 ENCOUNTER — Ambulatory Visit (INDEPENDENT_AMBULATORY_CARE_PROVIDER_SITE_OTHER): Payer: BLUE CROSS/BLUE SHIELD | Admitting: Sports Medicine

## 2015-09-11 ENCOUNTER — Encounter: Payer: Self-pay | Admitting: Sports Medicine

## 2015-09-11 VITALS — BP 126/67 | HR 65 | Ht 59.5 in | Wt 105.0 lb

## 2015-09-11 DIAGNOSIS — M1711 Unilateral primary osteoarthritis, right knee: Secondary | ICD-10-CM

## 2015-09-11 DIAGNOSIS — S8411XA Injury of peroneal nerve at lower leg level, right leg, initial encounter: Secondary | ICD-10-CM

## 2015-09-11 DIAGNOSIS — S8410XA Injury of peroneal nerve at lower leg level, unspecified leg, initial encounter: Secondary | ICD-10-CM | POA: Insufficient documentation

## 2015-09-11 NOTE — Assessment & Plan Note (Signed)
See OV note  Conservative care

## 2015-09-11 NOTE — Assessment & Plan Note (Signed)
This is gradually progressive with worse knee flexion  Difficult to walk down steps  Consider referral if and when limits interfere too much w ADL

## 2015-09-11 NOTE — Progress Notes (Signed)
Patient ID: Caitlin Hicks, female   DOB: 05/30/1945, 70 y.o.   MRN: [id number]  CC: R leg pain  HPI: Caitlin CarrowRuth L Hicks is a 70 y.o. yo female with PMH of OA or R knee, CPPD disease, and osteoporosis presenting with R leg pain.  Pt describes aching pain running for outside edge of R knee down into foot, only comes on when driving for long periods of time or sitting in recliner.  Does not hurt with standing/walking, has not tried any medication or bracing to relieve pain.  Position change will alleviate pain  Pt has history of OA in that knee with episodes of swelling, also CPPD disease in that knee with at least one large flair that caused intense pain.  This feels much different than either of those things. Denies weakness in that leg, however does have limited motion 2/2 arthritis pain.      ROS: Gen: - fevers/chills MSK: - except as per HPI/ no LBP/ no sciatica Neuro: no change in gait  PE: BP 126/67 mmHg  Pulse 65  Ht 4' 11.5" (1.511 m)  Wt 105 lb (47.628 kg)  BMI 20.86 kg/m2 Gen: [age] y.o. yo female sitting one exam table in NAD HEENT: normocephalic, atraumatic Pulm: normal work of breathing MSK:  RLE: R knee with distorted bony architecture on inspection, no overlying skin changes, bony spur felt slightly distal to lateral joint line and TTP posteriomedial to this area, 100 degrees of active flexion (compared to 140 on R side) limited by pain, knee extension/flexion and hip abduction strength 5/5, sensation grossly intact throughout, - Lachman and Thessaly + Tinels test at common peroneal n Gait: normal, unhindered  A/P: Caitlin Hicks is a 70 y.o. yo female with PMH per above presenting with R peroneal nerve neuropraxia and Knee OA  R peroneal nerve neuropraxia Most likely given 1) bony spur near course of nerve and 2) description of pain down leg that only comes on in positions that stretch/compress the nerve (driving, seated and leg extended).  Unlikely CPPD flair given history. --  Counseled pt that this can occur when arthritic changes of the knee compress or stretch the nerve in certain positions and that it is not a dangerous condition -- Advised to take B6 100 mg qday -- Advised use of cushion under her distal thigh and posterior knee when driving/sitting for prolonged period -- Drive/sit with leg in neutral position, instead of foot turned out (stretches the nerves)  Follow-up: PRN  Francie Massingominick Octave Montrose, MS4

## 2016-02-07 ENCOUNTER — Ambulatory Visit (INDEPENDENT_AMBULATORY_CARE_PROVIDER_SITE_OTHER): Payer: Medicare Other

## 2016-02-07 DIAGNOSIS — Z23 Encounter for immunization: Secondary | ICD-10-CM | POA: Diagnosis not present

## 2016-04-28 ENCOUNTER — Ambulatory Visit (INDEPENDENT_AMBULATORY_CARE_PROVIDER_SITE_OTHER): Payer: Medicare Other | Admitting: Sports Medicine

## 2016-04-28 VITALS — BP 141/69 | HR 68 | Ht 59.0 in | Wt 104.0 lb

## 2016-04-28 DIAGNOSIS — Z8739 Personal history of other diseases of the musculoskeletal system and connective tissue: Secondary | ICD-10-CM

## 2016-04-28 DIAGNOSIS — M25561 Pain in right knee: Secondary | ICD-10-CM

## 2016-04-28 DIAGNOSIS — M25461 Effusion, right knee: Secondary | ICD-10-CM

## 2016-04-28 MED ORDER — METHYLPREDNISOLONE ACETATE 40 MG/ML IJ SUSP
40.0000 mg | Freq: Once | INTRAMUSCULAR | Status: AC
  Administered 2016-04-28: 40 mg via INTRA_ARTICULAR

## 2016-04-28 NOTE — Progress Notes (Signed)
   Subjective:    Patient ID: Caitlin Hicks, female    DOB: 09/26/1945, 71 y.o.   MRN: [id number]  HPI chief complaint: Right knee pain and swelling  Very pleasant 71 year old female comes in today complaining of sudden onset right knee pain and swelling. 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 She had to undergo aspiration and injection in 2016. She was also placed on diclofenac but does not like to take NSAIDs. She states that 3 days ago she began to experience a sudden onset of stabbing pain in the right knee. This was followed by gradual swelling through the weekend. She is having difficulty bearing weight and walking. Difficulty bending her knee. Her symptoms are identical to what she experienced with her previous CPPD exacerbation.  Interim medical history reviewed Medications reviewed Allergies reviewed    Review of Systems As above    Objective:   Physical Exam  Petite. No acute distress. She is sitting comfortably in the exam room. Vital signs reviewed  Right knee: 2+ effusion. Limited range of motion. Joint is not warm to touch. Neurovascular intact distally. Walking with a significant limp and the assistance of a cane.      Assessment & Plan:   Right knee pain and swelling secondary to CPPD  Patient's right knee was aspirated and injected today. A superior lateral approach utilized. Total of 60 mL of blood-tinged synovial fluid were aspirated. Since her knee aspirate was previously evaluated and found to have evidence of CPPD, I do not believe that I need to send today's synovial fluid for a repeat analysis. Patient will utilize a compression sleeve for the next 48 hours except while sleeping. If symptoms return, I would start with getting updated x-rays of her knee. I've also encouraged her to take her anti-inflammatory medicine in the future at the first sign of any pain or swelling in her knee. Follow-up as needed.  Consent obtained and verified. Time-out  conducted. Noted no overlying erythema, induration, or other signs of local infection. Skin prepped in a sterile fashion. Topical analgesic spray: Ethyl chloride. Joint: right knee Needle: 18g 1.5 inch Completed without difficulty. Meds: 3cc 1% xylocaine for anesthesia of the skin; 3cc 1% xylocaine and 1cc (40mg ) depomedrol injected into the joint after aspiration  Advised to call if fevers/chills, erythema, induration, drainage, or persistent bleeding.

## 2016-06-13 ENCOUNTER — Ambulatory Visit (INDEPENDENT_AMBULATORY_CARE_PROVIDER_SITE_OTHER): Payer: Medicare Other | Admitting: Family Medicine

## 2016-06-13 ENCOUNTER — Encounter: Payer: Self-pay | Admitting: Family Medicine

## 2016-06-13 VITALS — BP 132/65 | HR 74 | Ht 59.0 in | Wt 104.0 lb

## 2016-06-13 DIAGNOSIS — Z8739 Personal history of other diseases of the musculoskeletal system and connective tissue: Secondary | ICD-10-CM | POA: Diagnosis not present

## 2016-06-13 DIAGNOSIS — M25562 Pain in left knee: Secondary | ICD-10-CM | POA: Diagnosis not present

## 2016-06-13 MED ORDER — COLCHICINE 0.6 MG PO TABS: 0.6000 mg | ORAL_TABLET | Freq: Two times a day (BID) | ORAL | 1 refills | Status: DC

## 2016-06-13 MED ORDER — METHYLPREDNISOLONE ACETATE 80 MG/ML IJ SUSP
80.0000 mg | Freq: Once | INTRAMUSCULAR | Status: AC
  Administered 2016-06-13: 80 mg via INTRA_ARTICULAR

## 2016-06-13 NOTE — Assessment & Plan Note (Signed)
Did aspiration and injection of left knee.  Offered colchicine, but did state that it can cause diarrhea.  She may not use 2/2 that side effect.  Follow up as needed.

## 2016-06-13 NOTE — Progress Notes (Signed)
  Caitlin CarrowRuth L Hicks - 71 y.o. female MRN [id number]  Date of birth: 10/28/1945  SUBJECTIVE:  Including CC & ROS.  CC: acute left knee pain and swelling  Presents with 1 day history of left knee pain and swelling.  She has a history of CPPD.  She usually has it in the other knee.  She has a history of colitis and does not wish to take NSAIDs.  Her last flare up was a month and a half ago.  She had her right knee aspirated and injected.  Her first episode was in June of last year.  She reports knee swelling without help of knee compression sleeve.   ROS: No unexpected weight loss, fever, chills, instability, muscle pain, numbness/tingling, redness, otherwise see HPI, +swelling  PMHx - Updated and reviewed.  Contributory factors include: CPPD, colitis, WPW, osteoporosis PSHx - Updated and reviewed.  Contributory factors include:  Negative FHx - Updated and reviewed.  Contributory factors include:  Negative Social Hx - Updated and reviewed. Contributory factors include: Negative Medications - reviewed   DATA REVIEWED: Previous office visit- 60 cc of fluid drained  PHYSICAL EXAM:  VS: BP:132/65  HR:74bpm  TEMP: ( )  RESP:   HT:4\' 11"  (149.9 cm)   WT:104 lb (47.2 kg)  BMI:21 PHYSICAL EXAM: Gen: NAD, alert, cooperative with exam, well-appearing HEENT: clear conjunctiva,  CV:  no edema, capillary refill brisk, normal rate Resp: non-labored Skin: no rashes, normal turgor  Neuro: no gross deficits.  Psych:  alert and oriented Knee: Inspection with effusion of left knee, no erythema Palpation with increased warmth and diffuse tenderness of left knee. ROM full decreased in flexion and extension due to swelling Patellar and quadriceps tendons unremarkable.   ASSESSMENT & PLAN:   Acute pain of left knee Did aspiration and injection of left knee.  Offered colchicine, but did state that it can cause diarrhea.  She may not use 2/2 that side effect.  Follow up as needed.  H/O calcium  pyrophosphate deposition disease (CPPD) Offered colchicine to try for ppx since this has occurred 3x over past year.  Side effects are diarrhea, so pt may not try.  Follow up PRN for aspiration and injection.  Procedure:  Injection of right knee Consent obtained and verified. Time-out conducted. Noted no overlying erythema, induration, or other signs of local infection. Skin prepped in a sterile fashion. Topical analgesic spray: Ethyl chloride. Completed without difficulty. Meds: 80 mg depomedrol, 3cc 1% lidocaine Pain immediately improved suggesting accurate placement of the medication. Advised to call if fevers/chills, erythema, induration, drainage, or persistent bleeding.

## 2016-06-13 NOTE — Assessment & Plan Note (Signed)
Offered colchicine to try for ppx since this has occurred 3x over past year.  Side effects are diarrhea, so pt may not try.  Follow up PRN for aspiration and injection.

## 2016-06-17 DIAGNOSIS — M1120 Other chondrocalcinosis, unspecified site: Secondary | ICD-10-CM | POA: Insufficient documentation

## 2016-06-17 DIAGNOSIS — K5130 Ulcerative (chronic) rectosigmoiditis without complications: Secondary | ICD-10-CM | POA: Insufficient documentation

## 2016-06-18 ENCOUNTER — Telehealth: Payer: Self-pay | Admitting: *Deleted

## 2016-06-18 NOTE — Telephone Encounter (Signed)
Well a referral would have t come from her PCP, not us. THANKS! Denny LevySara Keidan Aumiller

## 2016-07-01 ENCOUNTER — Encounter: Payer: Self-pay | Admitting: Family Medicine

## 2016-07-01 ENCOUNTER — Ambulatory Visit (INDEPENDENT_AMBULATORY_CARE_PROVIDER_SITE_OTHER): Payer: Medicare Other | Admitting: Family Medicine

## 2016-07-01 VITALS — BP 120/70 | Temp 97.9°F | Ht 59.5 in | Wt 105.2 lb

## 2016-07-01 DIAGNOSIS — I456 Pre-excitation syndrome: Secondary | ICD-10-CM

## 2016-07-01 DIAGNOSIS — K51919 Ulcerative colitis, unspecified with unspecified complications: Secondary | ICD-10-CM

## 2016-07-01 DIAGNOSIS — M1120 Other chondrocalcinosis, unspecified site: Secondary | ICD-10-CM | POA: Diagnosis not present

## 2016-07-01 DIAGNOSIS — Z8739 Personal history of other diseases of the musculoskeletal system and connective tissue: Secondary | ICD-10-CM

## 2016-07-01 DIAGNOSIS — R252 Cramp and spasm: Secondary | ICD-10-CM

## 2016-07-01 LAB — CBC WITH DIFFERENTIAL/PLATELET
BASOS PCT: 0.9 % (ref 0.0–3.0)
Basophils Absolute: 0 10*3/uL (ref 0.0–0.1)
Eosinophils Absolute: 0.1 10*3/uL (ref 0.0–0.7)
Eosinophils Relative: 1.3 % (ref 0.0–5.0)
HCT: 40.2 % (ref 36.0–46.0)
Hemoglobin: 13.5 g/dL (ref 12.0–15.0)
Lymphocytes Relative: 27.2 % (ref 12.0–46.0)
Lymphs Abs: 1.4 10*3/uL (ref 0.7–4.0)
MCHC: 33.6 g/dL (ref 30.0–36.0)
MCV: 86.1 fl (ref 78.0–100.0)
MONO ABS: 0.4 10*3/uL (ref 0.1–1.0)
Monocytes Relative: 8.5 % (ref 3.0–12.0)
Neutro Abs: 3.1 10*3/uL (ref 1.4–7.7)
Neutrophils Relative %: 62.1 % (ref 43.0–77.0)
Platelets: 222 10*3/uL (ref 150.0–400.0)
RBC: 4.66 Mil/uL (ref 3.87–5.11)
RDW: 16 % — AB (ref 11.5–15.5)
WBC: 5 10*3/uL (ref 4.0–10.5)

## 2016-07-01 LAB — COMPREHENSIVE METABOLIC PANEL
ALK PHOS: 68 U/L (ref 39–117)
ALT: 11 U/L (ref 0–35)
AST: 13 U/L (ref 0–37)
Albumin: 4.1 g/dL (ref 3.5–5.2)
BUN: 16 mg/dL (ref 6–23)
CALCIUM: 9.5 mg/dL (ref 8.4–10.5)
CO2: 27 mEq/L (ref 19–32)
CREATININE: 0.82 mg/dL (ref 0.40–1.20)
Chloride: 109 mEq/L (ref 96–112)
GFR: 72.61 mL/min (ref >60.00)
Glucose, Bld: 89 mg/dL (ref 70–99)
POTASSIUM: 4.3 meq/L (ref 3.5–5.1)
SODIUM: 141 meq/L (ref 135–145)
TOTAL PROTEIN: 6 g/dL (ref 6.0–8.3)
Total Bilirubin: 0.7 mg/dL (ref 0.2–1.2)

## 2016-07-01 LAB — C-REACTIVE PROTEIN

## 2016-07-01 LAB — VITAMIN B12: Vitamin B-12: 319 pg/mL (ref 211–911)

## 2016-07-01 LAB — MAGNESIUM: Magnesium: 2.3 mg/dL (ref 1.5–2.5)

## 2016-07-01 MED ORDER — PROPRANOLOL HCL 10 MG PO TABS: ORAL_TABLET | ORAL | 3 refills | Status: DC

## 2016-07-01 NOTE — Progress Notes (Signed)
Pre visit review using our clinic review tool, if applicable. No additional management support is needed unless otherwise documented below in the visit note. 

## 2016-07-01 NOTE — Progress Notes (Signed)
Subjective:     Patient ID: Caitlin Hicks, female   DOB: 1945/11/27, 71 y.o.   MRN: [id number]  HPI Here for medical follow-up. Her chronic problems include history of Wolff- Parkinson White syndrome, ulcerative colitis, osteoporosis, osteoarthritis, calcium per phosphate deposition disease. She's had multiple flareups of knee pain and effusion over the past year related to pseudogout. She's had her knee drained a couple times with steroid injections. She was prescribed colchicine-but never got the prescription filled. She would like to consider rheumatology consult to get their opinion.   She is followed by GI and her ulcerative colitis has been stable on Lialda. No recent diarrhea. Appetite and weight stable. Rheumatologist had recommend several labs be done  She has frequent leg cramps at night. Generally tries to drink plenty of fluids. She stays very active with exercising most days of the week  She is interested in considering new shingles vaccine but has not checked on insurance coverage yet  Past Medical History:  Diagnosis Date  . Arthritis    "knees, fingers" (01/11/2013)  . Colitis   . Osteoporosis   . Segmental colitis (HCC)   . Ulcerative colitis   . WPW (Wolff-Parkinson-White syndrome)    Past Surgical History:  Procedure Laterality Date  . BREAST BIOPSY Right 02/22/1995  . CATARACT EXTRACTION W/ INTRAOCULAR LENS  IMPLANT, BILATERAL Bilateral 2-05/2011  . INGUINAL HERNIA REPAIR Right ~ 1981  . ORIF HUMERUS FRACTURE Left 01/11/2013   Procedure: LEFT ORIF PROXIMAL HUMERUS FRACTURE;  Surgeon: Senaida Lange, MD;  Location: [facility] OR;  Service: Orthopedics;  Laterality: Left;  SAME DAY LABS  . ORIF PROXIMAL HUMERUS FRACTURE Left 01/11/2013  . TONSILLECTOMY  1948  . TUBAL LIGATION  ~ 1985    reports that she has never smoked. She has never used smokeless tobacco. She reports that she drinks about 1.2 oz of alcohol per week . She reports that she does not use drugs. family history  includes Osteoporosis in her mother. Allergies  Allergen Reactions  . Sulfonamide Derivatives Rash  . Sulfa Antibiotics Rash  . Penicillins Rash       Review of Systems  Constitutional: Negative for activity change, appetite change, fatigue, fever and unexpected weight change.  HENT: Negative for ear pain, hearing loss, sore throat and trouble swallowing.   Eyes: Negative for visual disturbance.  Respiratory: Negative for cough and shortness of breath.   Cardiovascular: Negative for chest pain and palpitations.  Gastrointestinal: Negative for abdominal pain, blood in stool, constipation and diarrhea.  Genitourinary: Negative for dysuria and hematuria.  Musculoskeletal: Positive for arthralgias. Negative for back pain and myalgias.  Skin: Negative for rash.  Neurological: Negative for dizziness, syncope and headaches.  Hematological: Negative for adenopathy.  Psychiatric/Behavioral: Negative for confusion and dysphoric mood.       Objective:   Physical Exam  Constitutional: She is oriented to person, place, and time. She appears well-developed and well-nourished.  HENT:  Head: Normocephalic and atraumatic.  Eyes: EOM are normal. Pupils are equal, round, and reactive to light.  Neck: Normal range of motion. Neck supple. No thyromegaly present.  Cardiovascular: Normal rate, regular rhythm and normal heart sounds.   No murmur heard. Pulmonary/Chest: Breath sounds normal. No respiratory distress. She has no wheezes. She has no rales.  Abdominal: Soft. Bowel sounds are normal. She exhibits no distension and no mass. There is no tenderness. There is no rebound and no guarding.  Musculoskeletal: Normal range of motion. She exhibits no edema.  Lymphadenopathy:  She has no cervical adenopathy.  Neurological: She is alert and oriented to person, place, and time. She displays normal reflexes. No cranial nerve deficit.  Skin: No rash noted.  Psychiatric: She has a normal mood and  affect. Her behavior is normal. Judgment and thought content normal.       Assessment:     #1 history of ulcerative colitis currently stable  #2 Wolff-Parkinson White syndrome  #3 pseudogout  #4 intermittent leg cramps    Plan:     -Check on coverage for new shingles vaccine -Set up rheumatology referral per patient request -Check labs with CBC, comp metabolic panel, B12, iron, TIBC, C-reactive protein, and magnesium level -Continue regular weightbearing exercise -We discussed other screening such as mammogram and bone density and she declines both  Caitlin Covey MD [facility]

## 2016-07-01 NOTE — Patient Instructions (Signed)
Let me know if interested in getting the Shingrix vaccine. We will set up rheumatology referral.

## 2016-07-02 ENCOUNTER — Encounter: Payer: Self-pay | Admitting: Family Medicine

## 2016-07-02 LAB — IRON AND TIBC
%SAT: 36 % (ref 11–50)
Iron: 111 ug/dL (ref 45–160)
TIBC: 311 ug/dL (ref 250–450)
UIBC: 200 ug/dL (ref 125–400)

## 2016-07-07 ENCOUNTER — Other Ambulatory Visit: Payer: Self-pay | Admitting: *Deleted

## 2016-07-07 MED ORDER — COLCHICINE 0.6 MG PO TABS: 0.6000 mg | ORAL_TABLET | Freq: Every day | ORAL | 0 refills | Status: DC

## 2016-12-11 ENCOUNTER — Encounter: Payer: Self-pay | Admitting: Family Medicine

## 2017-01-22 ENCOUNTER — Ambulatory Visit (INDEPENDENT_AMBULATORY_CARE_PROVIDER_SITE_OTHER): Payer: Medicare Other | Admitting: *Deleted

## 2017-01-22 DIAGNOSIS — Z23 Encounter for immunization: Secondary | ICD-10-CM

## 2017-03-09 ENCOUNTER — Emergency Department (HOSPITAL_COMMUNITY)
Admission: EM | Admit: 2017-03-09 | Discharge: 2017-03-09 | Disposition: A | Discharge status: Home / Self Care | Payer: Medicare Other | Attending: Emergency Medicine | Admitting: Emergency Medicine

## 2017-03-09 ENCOUNTER — Other Ambulatory Visit: Payer: Self-pay

## 2017-03-09 ENCOUNTER — Telehealth: Payer: Self-pay | Admitting: Internal Medicine

## 2017-03-09 ENCOUNTER — Encounter (HOSPITAL_COMMUNITY): Payer: Self-pay | Admitting: Emergency Medicine

## 2017-03-09 DIAGNOSIS — Z79899 Other long term (current) drug therapy: Secondary | ICD-10-CM | POA: Insufficient documentation

## 2017-03-09 DIAGNOSIS — R002 Palpitations: Secondary | ICD-10-CM | POA: Diagnosis present

## 2017-03-09 DIAGNOSIS — I471 Supraventricular tachycardia: Secondary | ICD-10-CM | POA: Diagnosis not present

## 2017-03-09 DIAGNOSIS — I456 Pre-excitation syndrome: Secondary | ICD-10-CM

## 2017-03-09 LAB — BASIC METABOLIC PANEL
Anion gap: 8 (ref 5–15)
BUN: 20 mg/dL (ref 6–20)
CALCIUM: 9.8 mg/dL (ref 8.9–10.3)
CO2: 22 mmol/L (ref 22–32)
CREATININE: 1 mg/dL (ref 0.44–1.00)
Chloride: 104 mmol/L (ref 101–111)
GFR calc Af Amer: >60 mL/min (ref >60)
GFR, EST NON AFRICAN AMERICAN: 55 mL/min — AB (ref >60)
GLUCOSE: 251 mg/dL — AB (ref 65–99)
Potassium: 5.1 mmol/L (ref 3.5–5.1)
SODIUM: 134 mmol/L — AB (ref 135–145)

## 2017-03-09 LAB — CBC
HCT: 44.3 % (ref 36.0–46.0)
Hemoglobin: 14.5 g/dL (ref 12.0–15.0)
MCH: 28.3 pg (ref 26.0–34.0)
MCHC: 32.7 g/dL (ref 30.0–36.0)
MCV: 86.4 fL (ref 78.0–100.0)
PLATELETS: 298 10*3/uL (ref 150–400)
RBC: 5.13 MIL/uL — ABNORMAL HIGH (ref 3.87–5.11)
RDW: 15 % (ref 11.5–15.5)
WBC: 13.3 10*3/uL — ABNORMAL HIGH (ref 4.0–10.5)

## 2017-03-09 LAB — I-STAT TROPONIN, ED: TROPONIN I, POC: 0.12 ng/mL — AB (ref 0.00–0.08)

## 2017-03-09 NOTE — ED Notes (Signed)
ED Provider at bedside. 

## 2017-03-09 NOTE — ED Notes (Signed)
Dr. Anitra LauthPlunkett states no further care, ready for discharge. Aware pt has refused xray.

## 2017-03-09 NOTE — Telephone Encounter (Signed)
Returned call to Pt.  Per Dr. Ladona Ridgelaylor Pt may take an addl 20 mg of inderal.  Pt asks if that does not help what should she do.  Advised Pt to go to ER if addl inderal does not help.  Pt scheduled with appt to see Dr. Ladona Ridgelaylor 12/21 @ 10:00 am.  Pt indicates she will come.

## 2017-03-09 NOTE — ED Provider Notes (Signed)
[facility]   CSN: [id number] Arrival date & time: 03/09/17  1424     History   Chief Complaint Chief Complaint  Patient presents with  . Irregular Heart Beat    HPI Caitlin Hicks is a 71 y.o. female.  Patient is a 71 year old female with a history of WPW, ulcerative colitis presenting today with palpitations.  This started 8am this morning.  She was in contact with her cardiologist Dr. Ladona Ridgelaylor and has taken 60 mg of Inderal without improvement of symptoms.  Patient does not take stimulants and is not on anticoagulation.  She attempted vagal maneuvers at home without any improvement.  She states now heart rate is better than it was.  At home it was 180-190 and currently is between 130 and 140.   The history is provided by the patient.    Past Medical History:  Diagnosis Date  . Arthritis    "knees, fingers" (01/11/2013)  . Colitis   . Osteoporosis   . Segmental colitis (HCC)   . Ulcerative colitis   . WPW (Wolff-Parkinson-White syndrome)     Patient Active Problem List   Diagnosis Date Noted  . 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 Tendinopathy of right gluteus medius 10/19/2014  . Osteoarthritis of right knee 08/30/2014  . Right shoulder pain 06/14/2014  . Osteoporosis 05/24/2014  . Hallux rigidus of right foot 05/23/2014  . Fracture of humerus, proximal, left, closed 01/12/2013  . WPW (Wolff-Parkinson-White syndrome) 11/24/2012  . Acute pain of left knee 08/19/2011  . Ulcerative colitis (HCC) 04/10/2011  . H/O calcium pyrophosphate deposition disease (CPPD) 04/22/2010  . KNEE PAIN, RIGHT 04/22/2010  . PATELLAR DISLOCATION, RIGHT 04/22/2010  . 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 ABNORMAL ELECTROCARDIOGRAM 01/17/2009    Past Surgical History:  Procedure Laterality Date  . BREAST BIOPSY Right 02/22/1995  . CATARACT EXTRACTION W/ INTRAOCULAR LENS  IMPLANT, BILATERAL Bilateral 2-05/2011  .  INGUINAL HERNIA REPAIR Right ~ 1981  . ORIF HUMERUS FRACTURE Left 01/11/2013   Procedure: LEFT ORIF PROXIMAL HUMERUS FRACTURE;  Surgeon: Senaida LangeKevin M Supple, MD;  Location: [facility] OR;  Service: Orthopedics;  Laterality: Left;  SAME DAY LABS  . ORIF PROXIMAL HUMERUS FRACTURE Left 01/11/2013  . TONSILLECTOMY  1948  . TUBAL LIGATION  ~ 1985    OB History    No data available       Home Medications    Prior to Admission medications   Medication Sig Start Date End Date Taking? Authorizing Provider  colchicine 0.6 MG tablet Take 1 tablet (0.6 mg total) by mouth daily. 07/07/16   Chitanand, Ilona SorrelAlicia B, DO  mesalamine (LIALDA) 1.2 G EC tablet Take 2.4 g by mouth 2 (two) times daily. Two tablets in the morning and Two tablets at bedtime    [provider]  multivitamin-lutein (OCUVITE-LUTEIN) CAPS capsule Take 1 capsule by mouth daily.    [provider]  OVER THE COUNTER MEDICATION Vitamin D    [provider]  propranolol (INDERAL) 10 MG tablet Take one tablet (10 mg) by mouth as needed for rapid heart rate- may take up to 4 tablets as needed over 2 hours 07/01/16   Burchette, Elberta FortisBruce W, MD    Family History Family History  Problem Relation Age of Onset  . Osteoporosis Mother   . Prostate cancer Unknown     Social History Social History   Tobacco Use  . Smoking status: Never Smoker  . Smokeless tobacco: Never  Used  Substance Use Topics  . Alcohol use: Yes    Alcohol/week: 1.2 oz    Types: 2 Glasses of wine per week    Comment: 01/11/2013 "2-3 glasses of wine/wk; some weeks not even that"  . Drug use: No     Allergies   Sulfonamide derivatives; Sulfa antibiotics; and Penicillins   Review of Systems Review of Systems  All other systems reviewed and are negative.    Physical Exam Updated Vital Signs BP 113/80 (BP Location: Left Arm)   Pulse (!) 146   Temp (!) 97.5 F (36.4 C) (Oral)   Resp 16   SpO2 100%   Physical Exam  Constitutional: She is  oriented to person, place, and time. She appears well-developed and well-nourished. No distress.  HENT:  Head: Normocephalic and atraumatic.  Mouth/Throat: Oropharynx is clear and moist.  Eyes: Conjunctivae and EOM are normal. Pupils are equal, round, and reactive to light.  Neck: Normal range of motion. Neck supple.  Cardiovascular: Regular rhythm and intact distal pulses. Tachycardia present.  No murmur heard. Pulmonary/Chest: Effort normal and breath sounds normal. No respiratory distress. She has no wheezes. She has no rales.  Abdominal: Soft. She exhibits no distension. There is no tenderness. There is no rebound and no guarding.  Musculoskeletal: Normal range of motion. She exhibits no edema or tenderness.  Neurological: She is alert and oriented to person, place, and time.  Skin: Skin is warm and dry. No rash noted. No erythema.  Psychiatric: She has a normal mood and affect. Her behavior is normal.  Nursing note and vitals reviewed.    ED Treatments / Results  Labs (all labs ordered are listed, but only abnormal results are displayed) Labs Reviewed  BASIC METABOLIC PANEL - Abnormal; Notable for the following components:      Result Value   Sodium 134 (*)    Glucose, Bld 251 (*)    GFR calc non Af Amer 55 (*)    All other components within normal limits  CBC - Abnormal; Notable for the following components:   WBC 13.3 (*)    RBC 5.13 (*)    All other components within normal limits  I-STAT TROPONIN, ED - Abnormal; Notable for the following components:   Troponin i, poc 0.12 (*)    All other components within normal limits    EKG  EKG Interpretation  Date/Time:  Monday March 09 2017 15:27:25 EST Ventricular Rate:  61 PR Interval:    QRS Duration: 97 QT Interval:  412 QTC Calculation: 415 R Axis:   70 Text Interpretation:  Sinus rhythm Borderline short PR interval Right atrial enlargement Abnormal R-wave progression, early transition Borderline repolarization  abnormality Wolff-Parkinson-White Confirmed by Gwyneth SproutPlunkett, Khamya Topp ([id number]) on 03/09/2017 3:32:15 PM       Radiology No results found.  Procedures Procedures (including critical care time)  Medications Ordered in ED Medications - No data to display   Initial Impression / Assessment and Plan / ED Course  I have reviewed the triage vital signs and the nursing notes.  Pertinent labs & imaging results that were available during my care of the patient were reviewed by me and considered in my medical decision making (see chart for details).     Patient is a 71 year old lady presenting today with a wide-complex tachycardia in the setting of history of Wolff-Parkinson-White.  Patient is hemodynamically stable at this time.  She has taken 60 mg of Inderal at home without improvement of symptoms.  Patient is  requesting cardioversion.  We will discuss this with cardiology.    3:24 PM Plan was to try adenosine however attempted vagal maneuver prior to getting IV access which spontaneously converted the patient.  Patient's heart rate is in the 60s.  Patient did have a mild troponin leak but is most likely just from heart rates in the 180s.  She denies any chest pain or shortness of breath currently.  Feel patient is safe for discharge home.  Final Clinical Impressions(s) / ED Diagnoses   Final diagnoses:  SVT (supraventricular tachycardia) (HCC)  WPW (Wolff-Parkinson-White syndrome)    ED Discharge Orders    None       Gwyneth Sprout, MD 03/09/17 1607

## 2017-03-09 NOTE — Telephone Encounter (Signed)
Patient calling, states that she has tachycardia of about 180-190 since 8 am this morning. Patient has taken 40 mg of Inderal and would like to know if she can take anymore medication to help with tachycardia?

## 2017-03-09 NOTE — ED Notes (Signed)
Pt refuses xray/VS

## 2017-03-09 NOTE — ED Notes (Signed)
Pt states she understands instructions. Home stable with steady gait. 

## 2017-03-09 NOTE — ED Triage Notes (Addendum)
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

## 2017-03-13 ENCOUNTER — Encounter: Payer: Self-pay | Admitting: Internal Medicine

## 2017-03-13 ENCOUNTER — Ambulatory Visit: Payer: Medicare Other | Admitting: Internal Medicine

## 2017-03-13 VITALS — BP 132/56 | HR 77 | Ht 59.75 in | Wt 103.0 lb

## 2017-03-13 DIAGNOSIS — I456 Pre-excitation syndrome: Secondary | ICD-10-CM

## 2017-03-13 MED ORDER — PROPRANOLOL HCL 10 MG PO TABS: ORAL_TABLET | ORAL | 3 refills | Status: DC

## 2017-03-13 NOTE — Progress Notes (Signed)
HPI [name] returns today for ongoing evaluation and management of SVT. She has a long h/o tachypalpitations who has had documented SVT and manifest pre-excitation. When I initially saw her I thought she might have an anteroseptal pathway although subsequent ECG suggest a left sided pathway. She feels well otherwise although she does have some anxiety with regard to her SVT. She had an episode a few weeks ago which was persistent lasting 4 hours and terminating with vagal maneuvers.  Allergies  Allergen Reactions  . Sulfonamide Derivatives Rash  . Sulfa Antibiotics Rash  . Penicillins Rash     Current Outpatient Medications  Medication Sig Dispense Refill  . Cholecalciferol (VITAMIN D PO) Take 1 tablet by mouth daily.    . colchicine 0.6 MG tablet Take 1 tablet (0.6 mg total) by mouth daily. 30 tablet 0  . mesalamine (LIALDA) 1.2 G EC tablet Take 2.4 g by mouth 2 (two) times daily. Two tablets in the morning and Two tablets at bedtime    . Multiple Vitamins-Minerals (ICAPS AREDS 2 PO) Take 1 capsule by mouth daily.    Marland Kitchen. OVER THE COUNTER MEDICATION Vitamin D    . propranolol (INDERAL) 10 MG tablet Take one tablet (10 mg) by mouth as needed for rapid heart rate- may take up to 4 tablets as needed over 2 hours 30 tablet 3   No current facility-administered medications for this visit.      Past Medical History:  Diagnosis Date  . Arthritis    "knees, fingers" (01/11/2013)  . Colitis   . Osteoporosis   . Segmental colitis (HCC)   . Ulcerative colitis   . WPW (Wolff-Parkinson-White syndrome)     ROS:   All systems reviewed and negative except as noted in the HPI.   Past Surgical History:  Procedure Laterality Date  . BREAST BIOPSY Right 02/22/1995  . CATARACT EXTRACTION W/ INTRAOCULAR LENS  IMPLANT, BILATERAL Bilateral 2-05/2011  . INGUINAL HERNIA REPAIR Right ~ 1981  . ORIF HUMERUS FRACTURE Left 01/11/2013   Procedure: LEFT ORIF PROXIMAL HUMERUS FRACTURE;  Surgeon:  Senaida LangeKevin M Supple, MD;  Location: [facility] OR;  Service: Orthopedics;  Laterality: Left;  SAME DAY LABS  . ORIF PROXIMAL HUMERUS FRACTURE Left 01/11/2013  . TONSILLECTOMY  1948  . TUBAL LIGATION  ~ 1985     Family History  Problem Relation Age of Onset  . Osteoporosis Mother   . Prostate cancer Unknown      Social History   Socioeconomic History  . Marital status: Married    Spouse name: Not on file  . Number of children: Not on file  . Years of education: Not on file  . Highest education level: Not on file  Social Needs  . Financial resource strain: Not on file  . Food insecurity - worry: Not on file  . Food insecurity - inability: Not on file  . Transportation needs - medical: Not on file  . Transportation needs - non-medical: Not on file  Occupational History  . Not on file  Tobacco Use  . Smoking status: Never Smoker  . Smokeless tobacco: Never Used  Substance and Sexual Activity  . Alcohol use: Yes    Alcohol/week: 1.2 oz    Types: 2 Glasses of wine per week    Comment: 01/11/2013 "2-3 glasses of wine/wk; some weeks not even that"  . Drug use: No  . Sexual activity: Yes  Other Topics Concern  . Not on file  Social  History Narrative   Charity fundraiserN for [employer]   Married           BP (!) 132/56   Pulse 77   Ht 4' 11.75" (1.518 m)   Wt 103 lb (46.7 kg)   SpO2 99%   BMI 20.28 kg/m   Physical Exam:  Well appearing 71 yo woman, looks younger than her stated age, NAD HEENT: Unremarkable Neck:  6 cm JVD, no thyromegally Lymphatics:  No adenopathy Back:  No CVA tenderness Lungs:  Clear with no wheezes HEART:  Regular rate rhythm, no murmurs, no rubs, no clicks Abd:  soft, positive bowel sounds, no organomegally, no rebound, no guarding Ext:  2 plus pulses, no edema, no cyanosis, no clubbing Skin:  No rashes no nodules Neuro:  CN II through XII intact, motor grossly intact  EKG - reviewed - NSR with ventricular pre-excitation   Assess/Plan: 1. WPW syndrome - I  have discussed the indications for EP study and catheter ablation with the patient and we also discussed taking daily toprol and increasing her propranolol. She will call us if she wishes to proceed with ablation. 2. SVT - she is stable with HR's in SVT in the 160's  I spent over 25 minutes including 50% face to face time with the patient producing her notes.  Leonia ReevesGregg Eliel Dudding,M.D.

## 2017-03-13 NOTE — Patient Instructions (Addendum)
Medication Instructions:  Your physician recommends that you continue on your current medications as directed. Please refer to the Current Medication list given to you today.  1.  Refilled propranolol 10 mg tablets.  May take 1-2 tablets at onset of SVT-up to 4 tablets in one hour.  Labwork: None ordered.  Testing/Procedures: None ordered.  Follow-Up: Your physician wants you to follow-up in: one year with Dr. Ladona Ridgelaylor.   You will receive a reminder letter in the mail two months in advance. If you don't receive a letter, please call our office to schedule the follow-up appointment.   Any Other Special Instructions Will Be Listed Below (If Applicable).  If you need a refill on your cardiac medications before your next appointment, please call your pharmacy.

## 2017-08-06 ENCOUNTER — Encounter: Payer: Self-pay | Admitting: Podiatry

## 2017-08-06 ENCOUNTER — Ambulatory Visit (INDEPENDENT_AMBULATORY_CARE_PROVIDER_SITE_OTHER): Payer: Medicare Other

## 2017-08-06 ENCOUNTER — Ambulatory Visit: Payer: Medicare Other | Admitting: Podiatry

## 2017-08-06 ENCOUNTER — Other Ambulatory Visit: Payer: Self-pay | Admitting: Podiatry

## 2017-08-06 VITALS — BP 113/53 | HR 54

## 2017-08-06 DIAGNOSIS — L84 Corns and callosities: Secondary | ICD-10-CM | POA: Diagnosis not present

## 2017-08-06 DIAGNOSIS — M2040 Other hammer toe(s) (acquired), unspecified foot: Secondary | ICD-10-CM

## 2017-08-06 DIAGNOSIS — M2042 Other hammer toe(s) (acquired), left foot: Secondary | ICD-10-CM | POA: Diagnosis not present

## 2017-08-06 DIAGNOSIS — M2041 Other hammer toe(s) (acquired), right foot: Secondary | ICD-10-CM

## 2017-08-06 NOTE — Progress Notes (Signed)
Subjective:   Patient ID: Caitlin Hicks, female   DOB: 72 y.o.   MRN: [id number]   HPI Patient presents with chronic keratotic lesion distal aspect digit to left with painful when pressed and make shoe gear difficult.  She is had this for years has had a tremor which gives her periodic relief but has become more aggravating recently.  Patient does not smoke and likes to be active   Review of Systems  All other systems reviewed and are negative.       Objective:  Physical Exam  Constitutional: She appears well-developed and well-nourished.  Cardiovascular: Intact distal pulses.  Pulmonary/Chest: Effort normal.  Musculoskeletal: Normal range of motion.  Neurological: She is alert.  Skin: Skin is warm.  Nursing note and vitals reviewed.   Neurovascular status intact muscle strength adequate range of motion within normal limits with patient noted to have distal keratotic lesion digit to left that is painful with elongated second toe and distal deformity of the toe with plantar flexion of the distal interphalangeal joint.  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  At this time distal debridement of lesion accomplished with sharp sterile instrumentation and buttress padding and digital pad applied to reduce pressure and discussed again the possibility for surgical intervention in future  X-ray indicates significant elongation digit to left with distal contracture

## 2017-08-06 NOTE — Patient Instructions (Signed)
Hammer Toe Hammer toe is a change in the shape (a deformity) of your second, third, or fourth toe. The deformity causes the middle joint of your toe to stay bent. This causes pain, especially when you are wearing shoes. Hammer toe starts gradually. At first, the toe can be straightened. Gradually over time, the deformity becomes stiff and permanent. Early treatments to keep the toe straight may relieve pain. As the deformity becomes stiff and permanent, surgery may be needed to straighten the toe. What are the causes? Hammer toe is caused by abnormal bending of the toe joint that is closest to your foot. It happens gradually over time. This pulls on the muscles and connections (tendons) of the toe joint, making them weak and stiff. It is often related to wearing shoes that are too short or narrow and do not let your toes straighten. What increases the risk? You may be at greater risk for hammer toe if you:  Are female.  Are older.  Wear shoes that are too small.  Wear high-heeled shoes that pinch your toes.  Are a ballet dancer.  Have a second toe that is longer than your big toe (first toe).  Injure your foot or toe.  Have arthritis.  Have a family history of hammer toe.  Have a nerve or muscle disorder.  What are the signs or symptoms? The main symptoms of this condition are pain and deformity of the toe. The pain is worse when wearing shoes, walking, or running. Other symptoms may include:  Corns or calluses over the bent part of the toe or between the toes.  Redness and a burning feeling on the toe.  An open sore that forms on the top of the toe.  Not being able to straighten the toe.  How is this diagnosed? This condition is diagnosed based on your symptoms and a physical exam. During the exam, your health care provider will try to straighten your toe to see how stiff the deformity is. You may also have tests, such as:  A blood test to check for rheumatoid  arthritis.  An X-ray to show how severe the deformity is.  How is this treated? Treatment for this condition will depend on how stiff the deformity is. Surgery is often needed. However, sometimes a hammer toe can be straightened without surgery. Treatments that do not involve surgery include:  Taping the toe into a straightened position.  Using pads and cushions to protect the toe (orthotics).  Wearing shoes that provide enough room for the toes.  Doing toe-stretching exercises at home.  Taking an NSAID to reduce pain and swelling.  If these treatments do not help or the toe cannot be straightened, surgery is the next option. The most common surgeries used to straighten a hammer toe include:  Arthroplasty. In this procedure, part of the joint is removed, and that allows the toe to straighten.  Fusion. In this procedure, cartilage between the two bones of the joint is taken out and the bones are fused together into one longer bone.  Implantation. In this procedure, part of the bone is removed and replaced with an implant to let the toe move again.  Flexor tendon transfer. In this procedure, the tendons that curl the toes down (flexor tendons) are repositioned.  Follow these instructions at home:  Take over-the-counter and prescription medicines only as told by your health care provider.  Do toe straightening and stretching exercises as told by your health care provider.  Keep all   follow-up visits as told by your health care provider. This is important. How is this prevented?  Wear shoes that give your toes enough room and do not cause pain.  Do not wear high-heeled shoes. Contact a health care provider if:  Your pain gets worse.  Your toe becomes red or swollen.  You develop an open sore on your toe. This information is not intended to replace advice given to you by your health care provider. Make sure you discuss any questions you have with your health care  provider. Document Released: 03/07/2000 Document Revised: 09/28/2015 Document Reviewed: 07/04/2015 Elsevier Interactive Patient Education  2018 [employer]vier Inc[employer]  

## 2017-11-06 ENCOUNTER — Other Ambulatory Visit: Payer: Self-pay | Admitting: *Deleted

## 2017-11-06 ENCOUNTER — Encounter: Payer: Self-pay | Admitting: Family Medicine

## 2017-11-06 ENCOUNTER — Ambulatory Visit: Payer: Medicare Other | Admitting: Family Medicine

## 2017-11-06 VITALS — BP 120/80 | HR 62 | Temp 97.7°F | Ht 59.0 in | Wt 102.5 lb

## 2017-11-06 DIAGNOSIS — Z0000 Encounter for general adult medical examination without abnormal findings: Secondary | ICD-10-CM | POA: Diagnosis not present

## 2017-11-06 DIAGNOSIS — R7989 Other specified abnormal findings of blood chemistry: Secondary | ICD-10-CM

## 2017-11-06 LAB — CBC WITH DIFFERENTIAL/PLATELET
Basophils Absolute: 0 10*3/uL (ref 0.0–0.1)
Basophils Relative: 0.9 % (ref 0.0–3.0)
Eosinophils Absolute: 0.1 10*3/uL (ref 0.0–0.7)
Eosinophils Relative: 3.1 % (ref 0.0–5.0)
HCT: 40.4 % (ref 36.0–46.0)
Hemoglobin: 13.3 g/dL (ref 12.0–15.0)
LYMPHS ABS: 1.4 10*3/uL (ref 0.7–4.0)
LYMPHS PCT: 32.7 % (ref 12.0–46.0)
MCHC: 33 g/dL (ref 30.0–36.0)
MCV: 85 fl (ref 78.0–100.0)
Monocytes Absolute: 0.4 10*3/uL (ref 0.1–1.0)
Monocytes Relative: 9.4 % (ref 3.0–12.0)
Neutro Abs: 2.3 10*3/uL (ref 1.4–7.7)
Neutrophils Relative %: 53.9 % (ref 43.0–77.0)
PLATELETS: 221 10*3/uL (ref 150.0–400.0)
RBC: 4.76 Mil/uL (ref 3.87–5.11)
RDW: 14.8 % (ref 11.5–15.5)
WBC: 4.3 10*3/uL (ref 4.0–10.5)

## 2017-11-06 LAB — BASIC METABOLIC PANEL
BUN: 17 mg/dL (ref 6–23)
CALCIUM: 9.8 mg/dL (ref 8.4–10.5)
CHLORIDE: 107 meq/L (ref 96–112)
CO2: 30 meq/L (ref 19–32)
CREATININE: 0.8 mg/dL (ref 0.40–1.20)
GFR: 74.43 mL/min (ref >60.00)
Glucose, Bld: 83 mg/dL (ref 70–99)
Potassium: 4.3 mEq/L (ref 3.5–5.1)
Sodium: 141 mEq/L (ref 135–145)

## 2017-11-06 LAB — LIPID PANEL
CHOL/HDL RATIO: 4
CHOLESTEROL: 167 mg/dL (ref 0–200)
HDL: 47.4 mg/dL (ref >39.00)
LDL CALC: 96 mg/dL (ref 0–99)
NonHDL: 119.27
Triglycerides: 116 mg/dL (ref 0.0–149.0)
VLDL: 23.2 mg/dL (ref 0.0–40.0)

## 2017-11-06 LAB — HEPATIC FUNCTION PANEL
ALBUMIN: 4.3 g/dL (ref 3.5–5.2)
ALT: 11 U/L (ref 0–35)
AST: 14 U/L (ref 0–37)
Alkaline Phosphatase: 78 U/L (ref 39–117)
Bilirubin, Direct: 0.1 mg/dL (ref 0.0–0.3)
TOTAL PROTEIN: 6.2 g/dL (ref 6.0–8.3)
Total Bilirubin: 0.7 mg/dL (ref 0.2–1.2)

## 2017-11-06 LAB — TSH: TSH: 5.22 u[IU]/mL — AB (ref 0.35–4.50)

## 2017-11-06 NOTE — Patient Instructions (Signed)
Consider new shingles vaccine (Shingrix) and check on coverage if interested.

## 2017-11-06 NOTE — Progress Notes (Signed)
Subjective:     Patient ID: Caitlin Hicks, female   DOB: May 13, 1945, 72 y.o.   MRN: [id number]  HPI Patient here for physical exam. She has with Parkinson White syndrome, ulcerative colitis,, calcium pyrophosphate deposition disease, and history of osteoporosis. She declines further osteoporosis medications. She remains on beta blocker as needed for SVT recurrence.. No recent SVT symptoms.  She has noted when she takes colchicine very early on for pseudogout flares this seems to help.  She remains on Lialda for ulcerative colitis and symptomatically stable.  She had Zostavax back in 2009. She had questions today regarding shingles vaccine. She also has questions regarding MMR as she is not sure she had measles. She declines further mammograms. She plans to get flu vaccine later this year. Last colonoscopy 12/29/11. Tetanus up-to-date. She's had Pneumovax and Prevnar 13  Past Medical History:  Diagnosis Date  . Arthritis    "knees, fingers" (01/11/2013)  . Colitis   . Osteoporosis   . Segmental colitis ([place])   . Ulcerative colitis   . WPW (Wolff-Parkinson-White syndrome)    Past Surgical History:  Procedure Laterality Date  . BREAST BIOPSY Right 02/22/1995  . CATARACT EXTRACTION W/ INTRAOCULAR LENS  IMPLANT, BILATERAL Bilateral 2-05/2011  . INGUINAL HERNIA REPAIR Right ~ 1981  . ORIF HUMERUS FRACTURE Left 01/11/2013   Procedure: LEFT ORIF PROXIMAL HUMERUS FRACTURE;  Surgeon: Marin Shutter, MD;  Location: [facility];  Service: Orthopedics;  Laterality: Left;  SAME DAY LABS  . ORIF PROXIMAL HUMERUS FRACTURE Left 01/11/2013  . TONSILLECTOMY  1948  . TUBAL LIGATION  ~ 1985    reports that she has never smoked. She has never used smokeless tobacco. She reports that she drinks about 2.0 standard drinks of alcohol per week. She reports that she does not use drugs. family history includes Osteoporosis in her mother; Prostate cancer in her unknown relative. Allergies  Allergen Reactions  .  Sulfonamide Derivatives Rash  . Sulfa Antibiotics Rash  . Penicillins Rash      Review of Systems  Constitutional: Negative for activity change, appetite change, fatigue, fever and unexpected weight change.  HENT: Negative for ear pain, hearing loss, sore throat and trouble swallowing.   Eyes: Negative for visual disturbance.  Respiratory: Negative for cough and shortness of breath.   Cardiovascular: Negative for chest pain and palpitations.  Gastrointestinal: Negative for abdominal pain, blood in stool, constipation and diarrhea.  Endocrine: Negative for polydipsia and polyuria.  Genitourinary: Negative for dysuria and hematuria.  Musculoskeletal: Positive for arthralgias. Negative for back pain and myalgias.  Skin: Negative for rash.  Neurological: Negative for dizziness, syncope and headaches.  Hematological: Negative for adenopathy.  Psychiatric/Behavioral: Negative for confusion and dysphoric mood.       Objective:   Physical Exam  Constitutional: She is oriented to person, place, and time. She appears well-developed and well-nourished.  HENT:  Right Ear: External ear normal.  Left Ear: External ear normal.  Mouth/Throat: Oropharynx is clear and moist. No oropharyngeal exudate.  Eyes: Pupils are equal, round, and reactive to light.  Neck: Normal range of motion. Neck supple. No thyromegaly present.  Cardiovascular: Normal rate and regular rhythm.  Pulmonary/Chest: Effort normal and breath sounds normal.  Musculoskeletal: She exhibits no edema.  Lymphadenopathy:    She has no cervical adenopathy.  Neurological: She is alert and oriented to person, place, and time. No cranial nerve deficit.  Skin: No rash noted.  Psychiatric: She has a normal mood and affect. Her behavior  is normal.       Assessment:     Physical exam. The following health maintenance issues were addressed    Plan:     -We explained that individuals born before 1959 generally/usually have good  immunity to MMR -Reminder for high-dose flu vaccine later this fall -Discussed pros and cons of new shingles vaccine and she will consider and check with insurance coverage -Obtain screening lab work -Continue follow-up colonoscopies as per GI recommendations  Eulas Post MD [facility]

## 2018-01-08 ENCOUNTER — Ambulatory Visit (INDEPENDENT_AMBULATORY_CARE_PROVIDER_SITE_OTHER): Payer: Medicare Other

## 2018-01-08 DIAGNOSIS — Z23 Encounter for immunization: Secondary | ICD-10-CM

## 2018-03-12 ENCOUNTER — Telehealth: Payer: Self-pay | Admitting: *Deleted

## 2018-03-30 ENCOUNTER — Encounter: Payer: Self-pay | Admitting: Internal Medicine

## 2018-03-30 ENCOUNTER — Ambulatory Visit: Payer: Medicare Other | Admitting: Internal Medicine

## 2018-03-30 VITALS — BP 118/70 | HR 66 | Ht 59.0 in | Wt 108.0 lb

## 2018-03-30 DIAGNOSIS — I456 Pre-excitation syndrome: Secondary | ICD-10-CM

## 2018-03-30 MED ORDER — PROPRANOLOL HCL 10 MG PO TABS: ORAL_TABLET | ORAL | 3 refills | Status: DC

## 2018-03-30 NOTE — Patient Instructions (Signed)

## 2018-03-30 NOTE — Progress Notes (Signed)
HPI Caitlin Hicks returns today for ongoing evaluation and management of SVT. She has a long h/o tachypalpitations who has had documented SVT and manifest pre-excitation.  Since I saw her last over a year ago, she has had 3-4 episodes of SVT up to 2 hours at a time. No adenosine and no ER visits. She has started to wear an Apple watch. Allergies  Allergen Reactions  . Sulfonamide Derivatives Rash  . Sulfa Antibiotics Rash  . Penicillins Rash     Current Outpatient Medications  Medication Sig Dispense Refill  . cholecalciferol (VITAMIN D3) 25 MCG (1000 UT) tablet Take 1,000 Units by mouth daily.    . colchicine 0.6 MG tablet Take 1 tablet (0.6 mg total) by mouth daily. (Patient taking differently: Take 0.6 mg by mouth daily as needed. ) 30 tablet 0  . mesalamine (LIALDA) 1.2 G EC tablet Take 2.4 g by mouth 2 (two) times daily. Two tablets in the morning and Two tablets at bedtime    . Multiple Vitamins-Minerals (PRESERVISION AREDS 2 PO) Take by mouth daily. Preservision    . propranolol (INDERAL) 10 MG tablet Take 1-2 tablets at onset of SVT.  May take up to 40 mg (4 tabs) in one hour. 40 tablet 3   No current facility-administered medications for this visit.      Past Medical History:  Diagnosis Date  . Arthritis    "knees, fingers" (01/11/2013)  . Colitis   . Osteoporosis   . Segmental colitis (HCC)   . Ulcerative colitis   . WPW (Wolff-Parkinson-White syndrome)     ROS:   All systems reviewed and negative except as noted in the HPI.   Past Surgical History:  Procedure Laterality Date  . BREAST BIOPSY Right 02/22/1995  . CATARACT EXTRACTION W/ INTRAOCULAR LENS  IMPLANT, BILATERAL Bilateral 2-05/2011  . INGUINAL HERNIA REPAIR Right ~ 1981  . ORIF HUMERUS FRACTURE Left 01/11/2013   Procedure: LEFT ORIF PROXIMAL HUMERUS FRACTURE;  Surgeon: Senaida Lange, MD;  Location: [facility];  Service: Orthopedics;  Laterality: Left;  [facility]  . ORIF PROXIMAL HUMERUS FRACTURE  Left 01/11/2013  . TONSILLECTOMY  1948  . TUBAL LIGATION  ~ 1985     Family History  Problem Relation Age of Onset  . Osteoporosis Mother   . Prostate cancer Other      Social History   Socioeconomic History  . Marital status: Married    Spouse name: Not on file  . Number of children: Not on file  . Years of education: Not on file  . Highest education level: Not on file  Occupational History  . Not on file  Social Needs  . Financial resource strain: Not on file  . Food insecurity:    Worry: Not on file    Inability: Not on file  . Transportation needs:    Medical: Not on file    Non-medical: Not on file  Tobacco Use  . Smoking status: Never Smoker  . Smokeless tobacco: Never Used  Substance and Sexual Activity  . Alcohol use: Yes    Alcohol/week: 2.0 standard drinks    Types: 2 Glasses of wine per week    Comment: 01/11/2013 "2-3 glasses of wine/wk; some weeks not even that"  . Drug use: No  . Sexual activity: Yes  Lifestyle  . Physical activity:    Days per week: Not on file    Minutes per session: Not on file  . Stress: Not  on file  Relationships  . Social connections:    Talks on phone: Not on file    Gets together: Not on file    Attends religious service: Not on file    Active member of club or organization: Not on file    Attends meetings of clubs or organizations: Not on file    Relationship status: Not on file  . Intimate partner violence:    Fear of current or ex partner: Not on file    Emotionally abused: Not on file    Physically abused: Not on file    Forced sexual activity: Not on file  Other Topics Concern  . Not on file  Social History Narrative   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           BP 118/70   Pulse 66   Ht 4\' 11"  (1.499 m)   Wt 108 lb (49 kg)   BMI 21.81 kg/m   Physical Exam:  Well appearing NAD HEENT: Unremarkable Neck:  No JVD, no thyromegally Lymphatics:  No adenopathy Back:  No CVA tenderness Lungs:  Clear  with no wheezes HEART:  Regular rate rhythm, no murmurs, no rubs, no clicks Abd:  soft, positive bowel sounds, no organomegally, no rebound, no guarding Ext:  2 plus pulses, no edema, no cyanosis, no clubbing Skin:  No rashes no nodules Neuro:  CN II through XII intact, motor grossly intact  EKG - nsr with very subtle ventricular pre-excitation.   Assess/Plan: 1. WPW - her symptoms are controlled and she is not bothered by her SVT. She will continue to use as needed propranolol and avoid caffeine.  I spent over 15 minutes with over 50% face to face time with the patient.  Leonia Reeves.D.

## 2018-04-12 ENCOUNTER — Other Ambulatory Visit (INDEPENDENT_AMBULATORY_CARE_PROVIDER_SITE_OTHER): Payer: Medicare Other

## 2018-04-12 DIAGNOSIS — R7989 Other specified abnormal findings of blood chemistry: Secondary | ICD-10-CM | POA: Diagnosis not present

## 2018-04-12 LAB — TSH: TSH: 5.68 u[IU]/mL — ABNORMAL HIGH (ref 0.35–4.50)

## 2018-04-12 LAB — T4, FREE: FREE T4: 0.77 ng/dL (ref 0.60–1.60)

## 2018-04-14 ENCOUNTER — Ambulatory Visit: Payer: Medicare Other | Admitting: Family Medicine

## 2018-04-14 ENCOUNTER — Encounter: Payer: Self-pay | Admitting: Family Medicine

## 2018-04-14 ENCOUNTER — Other Ambulatory Visit: Payer: Self-pay

## 2018-04-14 VITALS — BP 110/66 | HR 68 | Temp 97.6°F | Ht 59.0 in | Wt 107.4 lb

## 2018-04-14 DIAGNOSIS — R0989 Other specified symptoms and signs involving the circulatory and respiratory systems: Secondary | ICD-10-CM | POA: Diagnosis not present

## 2018-04-14 DIAGNOSIS — R7989 Other specified abnormal findings of blood chemistry: Secondary | ICD-10-CM

## 2018-04-14 NOTE — Progress Notes (Signed)
  Subjective:     Patient ID: Caitlin Hicks, female   DOB: June 09, 1945, 73 y.o.   MRN: [id number]  HPI Patient is seen for follow-up regarding thyroid concerns.  She had recent TSH 5.68.  Her free T4 was normal.  She has occasional cold intolerance but no other overt symptoms of hypothyroidism.  She has had some very mild symptoms of globus lower neck region recently.  No dysphagia to solids or liquids.  No pain with swallowing.  No appetite or weight changes.  She does have family history of hypothyroidism in her father and apparently a sister.  Past Medical History:  Diagnosis Date  . Arthritis    "knees, fingers" (01/11/2013)  . Colitis   . Osteoporosis   . Segmental colitis (HCC)   . Ulcerative colitis   . WPW (Wolff-Parkinson-White syndrome)    Past Surgical History:  Procedure Laterality Date  . BREAST BIOPSY Right 02/22/1995  . CATARACT EXTRACTION W/ INTRAOCULAR LENS  IMPLANT, BILATERAL Bilateral 2-05/2011  . INGUINAL HERNIA REPAIR Right ~ 1981  . ORIF HUMERUS FRACTURE Left 01/11/2013   Procedure: LEFT ORIF PROXIMAL HUMERUS FRACTURE;  Surgeon: Senaida Lange, MD;  Location: [facility];  Service: Orthopedics;  Laterality: Left;  [facility]  . ORIF PROXIMAL HUMERUS FRACTURE Left 01/11/2013  . TONSILLECTOMY  1948  . TUBAL LIGATION  ~ 1985    reports that she has never smoked. She has never used smokeless tobacco. She reports current alcohol use of about 2.0 standard drinks of alcohol per week. She reports that she does not use drugs. family history includes Osteoporosis in her mother; Prostate cancer in an other family member. Allergies  Allergen Reactions  . Sulfonamide Derivatives Rash  . Sulfa Antibiotics Rash  . Penicillins Rash     Review of Systems  Constitutional: Negative for appetite change and unexpected weight change.  HENT: Negative for sore throat, trouble swallowing and voice change.   Respiratory: Negative for shortness of breath.   Endocrine: Negative for cold  intolerance.  Hematological: Negative for adenopathy.       Objective:   Physical Exam Constitutional:      Appearance: Normal appearance.  Neck:     Musculoskeletal: Neck supple.     Comments: No thyroid nodules or thyroid masses palpated Cardiovascular:     Rate and Rhythm: Normal rate and regular rhythm.  Pulmonary:     Effort: Pulmonary effort is normal.     Breath sounds: Normal breath sounds.  Neurological:     Mental Status: She is alert.        Assessment:     Patient had recent mildly elevated TSH 5.68 with normal free T4.  Suspect subclinical hypothyroidism.  She has some very mild globus symptoms but no dysphagia and does not have any other red flags such as appetite or weight change    Plan:     -We recommended that we repeat TSH and free T4 in about 6 months.  We reviewed signs and symptoms of hypothyroidism.  If TSH climbing at that point consider low-dose replacement  [name] MD [facility]

## 2018-04-28 DIAGNOSIS — R053 Chronic cough: Secondary | ICD-10-CM | POA: Insufficient documentation

## 2018-04-28 DIAGNOSIS — R05 Cough: Secondary | ICD-10-CM | POA: Insufficient documentation

## 2018-04-28 DIAGNOSIS — R0989 Other specified symptoms and signs involving the circulatory and respiratory systems: Secondary | ICD-10-CM | POA: Insufficient documentation

## 2018-09-22 ENCOUNTER — Encounter: Payer: Self-pay | Admitting: Podiatry

## 2018-09-22 ENCOUNTER — Ambulatory Visit: Payer: Medicare Other | Admitting: Podiatry

## 2018-09-22 ENCOUNTER — Other Ambulatory Visit: Payer: Self-pay

## 2018-09-22 VITALS — Temp 97.0°F

## 2018-09-22 DIAGNOSIS — M2040 Other hammer toe(s) (acquired), unspecified foot: Secondary | ICD-10-CM | POA: Diagnosis not present

## 2018-09-22 DIAGNOSIS — L84 Corns and callosities: Secondary | ICD-10-CM

## 2018-09-22 NOTE — Progress Notes (Signed)
Subjective:   Patient ID: Caitlin Hicks, female   DOB: 73 y.o.   MRN: [id number]   HPI Patient presents with very painful corn at the end of the third digit right foot slight in the second digit and also left foot.  Also has digital deformities bilateral which are contributory to this problem   ROS      Objective:  Physical Exam  Digital deformities of the lesser digits with chronic hammertoe deformity with rigid contracture distal joint with distal keratotic lesions painful     Assessment:  Hammertoe deformity bilateral along with chronic lesions associated with structure of the toes     Plan:  H&P and reviewed the digital contracture and I do think this would require bone surgery versus soft tissue procedure.  Debrided lesions applied buttress pad to the right second digit and will be seen back as needed for further treatment if symptoms persist

## 2018-09-28 ENCOUNTER — Telehealth: Payer: Self-pay | Admitting: *Deleted

## 2018-09-28 NOTE — Telephone Encounter (Signed)
I apologized for the late call, and pt described the way the toe looked and that she has been covering with a bandaid. I offered pt an appt to see the nurse to evaluate and possibly Dr. Paulla Dolly. Pt agreed.

## 2018-09-28 NOTE — Telephone Encounter (Signed)
Pt states she is still having pain in the 4th toe on the right foot and it looks like it may either be cut too deep or not enough and is painful.

## 2018-10-01 ENCOUNTER — Ambulatory Visit (INDEPENDENT_AMBULATORY_CARE_PROVIDER_SITE_OTHER): Payer: Medicare Other | Admitting: Podiatry

## 2018-10-01 ENCOUNTER — Encounter: Payer: Self-pay | Admitting: Podiatry

## 2018-10-01 ENCOUNTER — Other Ambulatory Visit: Payer: Self-pay

## 2018-10-01 VITALS — Temp 98.0°F

## 2018-10-01 DIAGNOSIS — L84 Corns and callosities: Secondary | ICD-10-CM | POA: Diagnosis not present

## 2018-10-01 DIAGNOSIS — M2040 Other hammer toe(s) (acquired), unspecified foot: Secondary | ICD-10-CM

## 2018-10-05 NOTE — Progress Notes (Signed)
Subjective:   Patient ID: Caitlin Hicks, female   DOB: 73 y.o.   MRN: [id number]   HPI Patient presents stating the fourth toe is really bothering me in the second toe is doing much better   ROS      Objective:  Physical Exam  Neurovascular status intact with hammertoe deformity digit for right with rotation of the toe and distal keratotic tissue formation     Assessment:  Lesion secondary to structural position of the fourth digit     Plan:  H&P condition reviewed and I debrided the lesion with no iatrogenic bleeding discussed hammertoe correction explaining arthroplasty which may be necessary.  Applied padding and will see back as needed signed visit

## 2018-12-02 ENCOUNTER — Other Ambulatory Visit: Payer: Self-pay

## 2019-02-15 ENCOUNTER — Encounter: Payer: Self-pay | Admitting: Family Medicine

## 2019-04-11 ENCOUNTER — Telehealth: Payer: Self-pay

## 2019-04-11 DIAGNOSIS — M810 Age-related osteoporosis without current pathological fracture: Secondary | ICD-10-CM

## 2019-04-11 DIAGNOSIS — Z780 Asymptomatic menopausal state: Secondary | ICD-10-CM

## 2019-04-11 NOTE — Telephone Encounter (Signed)
Called patient and gave her the information from Dr. Caryl Never. Patient verbalized an understanding and I gave her the number to the [facility] lab so she could call them.

## 2019-04-11 NOTE — Telephone Encounter (Signed)
Patient called and asked if Dr. Caryl Never could order a Bone Density DEXA scan for her. She has been exercising and wants to know if there are any changes from her DEXA from 2014. Please order DEXA scan. Thank you!

## 2019-04-11 NOTE — Telephone Encounter (Signed)
Let her know that I have placed order for DEXA scan.  This needs to be scheduled at [facility] clinic

## 2019-04-12 ENCOUNTER — Encounter: Payer: Self-pay | Admitting: Family Medicine

## 2019-04-19 ENCOUNTER — Ambulatory Visit (INDEPENDENT_AMBULATORY_CARE_PROVIDER_SITE_OTHER)
Admission: RE | Admit: 2019-04-19 | Discharge: 2019-04-19 | Disposition: A | Discharge status: Home / Self Care | Payer: Medicare PPO | Source: Ambulatory Visit | Attending: Family Medicine | Admitting: Family Medicine

## 2019-04-19 ENCOUNTER — Other Ambulatory Visit: Payer: Self-pay

## 2019-04-19 DIAGNOSIS — Z780 Asymptomatic menopausal state: Secondary | ICD-10-CM

## 2019-04-19 DIAGNOSIS — M810 Age-related osteoporosis without current pathological fracture: Secondary | ICD-10-CM

## 2019-04-20 ENCOUNTER — Ambulatory Visit: Payer: Medicare Other

## 2019-04-27 ENCOUNTER — Telehealth (INDEPENDENT_AMBULATORY_CARE_PROVIDER_SITE_OTHER): Payer: Medicare PPO | Admitting: Family Medicine

## 2019-04-27 ENCOUNTER — Other Ambulatory Visit: Payer: Self-pay

## 2019-04-27 ENCOUNTER — Encounter: Payer: Self-pay | Admitting: Family Medicine

## 2019-04-27 VITALS — BP 110/68 | Wt 100.0 lb

## 2019-04-27 DIAGNOSIS — R7989 Other specified abnormal findings of blood chemistry: Secondary | ICD-10-CM

## 2019-04-27 DIAGNOSIS — M810 Age-related osteoporosis without current pathological fracture: Secondary | ICD-10-CM

## 2019-04-27 DIAGNOSIS — R6889 Other general symptoms and signs: Secondary | ICD-10-CM

## 2019-04-27 DIAGNOSIS — R0989 Other specified symptoms and signs involving the circulatory and respiratory systems: Secondary | ICD-10-CM

## 2019-04-27 NOTE — Progress Notes (Signed)
This visit type was conducted due to national recommendations for restrictions regarding the COVID-19 pandemic in an effort to limit this patient's exposure and mitigate transmission in our community.   Virtual Visit via Video Note  I connected with Caitlin Hicks on 04/27/19 at 10:30 AM EST by a video enabled telemedicine application and verified that I am speaking with the correct person using two identifiers.  Location patient: home Location provider:work or home office Persons participating in the virtual visit: patient, provider  I discussed the limitations of evaluation and management by telemedicine and the availability of in person appointments. The patient expressed understanding and agreed to proceed.   HPI:  Caitlin Hicks set up this virtual follow-up to discuss the following items  Recent DEXA scan.  Her T score is -3.0 which was a 9% decline from previous DEXA.  She is adamant that she does not wish to take any osteoporosis therapies.  She does take vitamin D currently 1000 international units daily and tries to get calcium through her diet but no supplements.  She walks about 3 miles most days and tries to do some jumping activities as well as weights.  She is very discouraged at her results.  She has a very petite frame.  She is not had any recent fractures.  She has had prior elevated TSH and probable subclinical hypothyroidism.  She has not had any overt symptoms of hypothyroidism.  Her last TSH was around 5.6.  Needs follow-up.  Previous free T4 was normal.  Patient has had globus type symptoms for the past year.  She went to ENT and had nasolaryngoscopy and there was concern for possible GERD.  She took omeprazole for some time but did not see any improvement.  She went to her GI and had EGD which showed no abnormalities.  No stricture.  No esophagitis changes.  She has tapered off the omeprazole.  She has what she describes a "pressure "sensation which is actually not with eating  and sometimes even relieved with eating.  No true dysphagia.  No odynophagia.  She has never had any pressure sensation with walking.  Her GI doctor suggested cardiology evaluation.  She does have with Parkinson White and plans to see cardiologist soon.  She is again never had any count of exertional symptoms whatsoever.  She does notice that when she belches symptoms seem to be relieved temporarily.  She does not chew gum and does not use any carbonated beverages.  No regular alcohol use.  Other issues she describes a "squeezing sensation intermittently right leg.  She does not describe this as a cramp.  No triggering factors.  No edema.  Generally walking without difficulty.  She does not describe any claudication symptoms.  She has not seen any swelling.  No redness.   ROS: See pertinent positives and negatives per HPI.  Past Medical History:  Diagnosis Date  . Arthritis    "knees, fingers" (01/11/2013)  . Colitis   . Osteoporosis   . Segmental colitis (HCC)   . Ulcerative colitis   . WPW (Wolff-Parkinson-White syndrome)     Past Surgical History:  Procedure Laterality Date  . BREAST BIOPSY Right 02/22/1995  . CATARACT EXTRACTION W/ INTRAOCULAR LENS  IMPLANT, BILATERAL Bilateral 2-05/2011  . INGUINAL HERNIA REPAIR Right ~ 1981  . ORIF HUMERUS FRACTURE Left 01/11/2013   Procedure: LEFT ORIF PROXIMAL HUMERUS FRACTURE;  Surgeon: Senaida Lange, MD;  Location: [facility] OR;  Service: Orthopedics;  Laterality: Left;  S[facility]  .  ORIF PROXIMAL HUMERUS FRACTURE Left 01/11/2013  . TONSILLECTOMY  1948  . TUBAL LIGATION  ~ 1985    Family History  Problem Relation Age of Onset  . Osteoporosis Mother   . Prostate cancer Other     SOCIAL HX: Non-smoker.  She works part-time still nursing with drains per day school   Current Outpatient Medications:  .  cholecalciferol (VITAMIN D3) 25 MCG (1000 UT) tablet, Take 1,000 Units by mouth daily., Disp: , Rfl:  .  colchicine 0.6 MG tablet, Take 1  tablet (0.6 mg total) by mouth daily. (Patient taking differently: Take 0.6 mg by mouth daily as needed. ), Disp: 30 tablet, Rfl: 0 .  mesalamine (LIALDA) 1.2 G EC tablet, Take 2.4 g by mouth 2 (two) times daily. Two tablets in the morning and Two tablets at bedtime, Disp: , Rfl:  .  Multiple Vitamins-Minerals (PRESERVISION AREDS 2 PO), Take by mouth 2 (two) times daily. Preservision , Disp: , Rfl:  .  propranolol (INDERAL) 10 MG tablet, Take 1-2 tablets at onset of SVT.  May take up to 40 mg (4 tabs) in one hour., Disp: 40 tablet, Rfl: 3  EXAM:  VITALS per patient if applicable:  GENERAL: alert, oriented, appears well and in no acute distress  HEENT: atraumatic, conjunttiva clear, no obvious abnormalities on inspection of external nose and ears  NECK: normal movements of the head and neck  LUNGS: on inspection no signs of respiratory distress, breathing rate appears normal, no obvious gross SOB, gasping or wheezing  CV: no obvious cyanosis  MS: moves all visible extremities without noticeable abnormality  PSYCH/NEURO: pleasant and cooperative, no obvious depression or anxiety, speech and thought processing grossly intact  ASSESSMENT AND PLAN:  Discussed the following assessment and plan:  #1 osteoporosis by recent DEXA scan.  Patient is declining osteoporosis specific therapies.  She has read up rather extensively on various treatment options including bisphosphonates, Prolia, etc. and she declines all of these  -Continue regular vitamin D and she will increase this to 2000 units daily.  Continue regular weightbearing exercise -She is being scheduled for follow-up next week and will discuss further at that time  #2 persistent globus symptoms with fairly extensive work-up as above and no clear etiology.  Nonresponsive to PPIs..  Discuss further at follow-up  #3 history of elevated TSH with normal free T4.  Possible subclinical hypothyroidism,  -Set up repeat TSH and free T4 at  follow-up next week  #4 complaints of "pressure sensation "right leg intermittently.  Does not sound like claudication type symptoms at all and she walks without difficulty. -Office follow-up next week to assess     I discussed the assessment and treatment plan with the patient. The patient was provided an opportunity to ask questions and all were answered. The patient agreed with the plan and demonstrated an understanding of the instructions.   The patient was advised to call back or seek an in-person evaluation if the symptoms worsen or if the condition fails to improve as anticipated.     Carolann Littler, MD

## 2019-05-02 ENCOUNTER — Other Ambulatory Visit: Payer: Self-pay

## 2019-05-02 ENCOUNTER — Ambulatory Visit: Payer: Medicare PPO | Admitting: Family Medicine

## 2019-05-02 ENCOUNTER — Encounter: Payer: Self-pay | Admitting: Family Medicine

## 2019-05-02 VITALS — BP 114/70 | HR 68 | Temp 97.7°F | Wt 104.4 lb

## 2019-05-02 DIAGNOSIS — R7989 Other specified abnormal findings of blood chemistry: Secondary | ICD-10-CM

## 2019-05-02 DIAGNOSIS — M810 Age-related osteoporosis without current pathological fracture: Secondary | ICD-10-CM

## 2019-05-02 DIAGNOSIS — R0989 Other specified symptoms and signs involving the circulatory and respiratory systems: Secondary | ICD-10-CM | POA: Diagnosis not present

## 2019-05-02 DIAGNOSIS — R6889 Other general symptoms and signs: Secondary | ICD-10-CM | POA: Diagnosis not present

## 2019-05-02 LAB — TSH: TSH: 6.49 u[IU]/mL — ABNORMAL HIGH (ref 0.35–4.50)

## 2019-05-02 LAB — T4, FREE: Free T4: 0.94 ng/dL (ref 0.60–1.60)

## 2019-05-02 NOTE — Patient Instructions (Signed)
Globus Pharyngeus Globus pharyngeus is a condition that makes it feel like you have a lump in your throat. It may also feel like you have something stuck in the front of your throat. This feeling may come and go. It is not painful, and it does not make it harder to swallow food or liquid. Globus pharyngeus does not cause changes that a health care provider can see during a physical exam. This condition usually goes away without treatment. What are the causes? Often, no cause can be found. The most common cause of globus pharyngeus is a condition that causes stomach juices to flow back up into the throat (gastroesophageal reflux). Other possible causes include:  Overstimulation of nerves that control swallowing.  Irritation of nerves that control swallowing (neuralgia).  An enlarged gland in the lower neck (thyroid gland).  Growth of tonsil tissue at the base of the tongue (lingual tonsil).  Anxiety.  Depression. What are the signs or symptoms? The main symptom of this condition is a feeling of a lump in your throat. This feeling usually comes and goes. How is this diagnosed? This condition may be diagnosed after other conditions have been ruled out. You may have tests, such as:  A swallow study.  Ear, nose, and throat evaluation.  An exam of your throat using a thin, flexible tube with a light and camera on the end (endoscopy). How is this treated? This condition may go away on its own, without treatment. In some cases, antidepressant medicines may be helpful. Follow these instructions at home:   Follow instructions from your health care provider about eating or drinking restrictions.  Take over-the-counter and prescription medicines only as told by your health care provider.  Keep all follow-up visits as told by your health care provider. This is important.  Follow instructions from your health care provider about home care for gastroesophageal reflux. Your health care provider  may recommend that you: ? Do not eat or drink anything that causes heartburn. ? Do not eat heavy meals close to bedtime. ? Do not drink caffeine. ? Do not drink alcohol. ? Raise the head of your bed. ? Sleep on your left side. Contact a health care provider if:  Your symptoms get worse.  You have throat pain.  You have trouble swallowing.  Food or liquid comes back up into your mouth.  You lose weight without trying. Get help right away if:  You develop swelling in your throat. Summary  Globus pharyngeus is a condition that makes it feel like you have a lump in your throat.  This condition usually goes away without treatment. This information is not intended to replace advice given to you by your health care provider. Make sure you discuss any questions you have with your health care provider. Document Revised: 02/20/2017 Document Reviewed: 11/14/2015 Elsevier Patient Education  2020 [employer].

## 2019-05-02 NOTE — Progress Notes (Signed)
Subjective:     Patient ID: Caitlin Hicks, female   DOB: [birth year], 74 y.o.   MRN: [id number]  HPI Caitlin Hicks is here for follow-up to discuss several issues as follows  History of elevated TSH. She states that she is cold "all the time ". This is not new and she denies any overt symptoms of hypothyroidism. We recommended follow-up now for repeat TSH and free T4.  Sensation of globus which she has had for about a year now. Previous nasolaryngoscopy and EGD were unrevealing. She does not have any active reflux symptoms. She did not respond to proton pump inhibitor. Denies any significant anxiety symptoms. No pain with swallowing. No weight loss. Appetite is good. She states that her symptoms are actually leave somewhat with eating.  Other symptom is right leg pressure which she notices just with reclining. She has no pain with walking. She has not noted any significant edema. No numbness or weakness. No back pain. No skin rashes. She does not describe any claudication symptoms  Past Medical History:  Diagnosis Date  . Arthritis    "knees, fingers" (01/11/2013)  . Colitis   . Osteoporosis   . Segmental colitis ([place])   . Ulcerative colitis   . WPW (Wolff-Parkinson-White syndrome)    Past Surgical History:  Procedure Laterality Date  . BREAST BIOPSY Right 02/22/1995  . CATARACT EXTRACTION W/ INTRAOCULAR LENS  IMPLANT, BILATERAL Bilateral 2-05/2011  . INGUINAL HERNIA REPAIR Right ~ 1981  . ORIF HUMERUS FRACTURE Left 01/11/2013   Procedure: LEFT ORIF PROXIMAL HUMERUS FRACTURE;  Surgeon: Marin Shutter, MD;  Location: [facility];  Service: Orthopedics;  Laterality: Left;  SAME DAY LABS  . ORIF PROXIMAL HUMERUS FRACTURE Left 01/11/2013  . TONSILLECTOMY  1948  . TUBAL LIGATION  ~ 1985    reports that she has never smoked. She has never used smokeless tobacco. She reports current alcohol use of about 2.0 standard drinks of alcohol per week. She reports that she does not use drugs. family history  includes Osteoporosis in her mother; Prostate cancer in an other family member. Allergies  Allergen Reactions  . Other Rash  . Sulfonamide Derivatives Rash  . Sulfa Antibiotics Rash  . Penicillins Rash     Review of Systems  Constitutional: Negative for appetite change, chills, fever and unexpected weight change.  HENT: Negative for sore throat.   Respiratory: Negative for cough and shortness of breath.   Cardiovascular: Negative for chest pain.  Gastrointestinal: Negative for abdominal pain, nausea and vomiting.  Neurological: Negative for dizziness.       Objective:   Physical Exam Vitals reviewed.  Constitutional:      Appearance: Normal appearance.  Cardiovascular:     Rate and Rhythm: Normal rate and regular rhythm.  Pulmonary:     Effort: Pulmonary effort is normal.     Breath sounds: Normal breath sounds.  Musculoskeletal:     Cervical back: Neck supple. No rigidity.     Comments: No significant edema. There may be very subtle slight increase right leg compared to left in size but no calf tenderness. No color changes.  Distal pulses are normal and 2+ dorsalis pedis and posterior tibial.  Lymphadenopathy:     Cervical: No cervical adenopathy.  Neurological:     Mental Status: She is alert.        Assessment:     #1 history of abnormal TSH with elevated TSH. No overt symptoms of hypothyroidism  #2 persistent sensation of globus with previous  evaluation including EGD and nasolaryngoscopy which were unremarkable  #3 sensation of right leg pressure. Relatively normal exam. No reproducible tenderness. Evidence for good arterial circulation  #4 osteoporosis. We discussed recent studies with patient and she declines further therapy    Plan:     -Check TSH and free T4  -We discussed globus in some detail. She did not respond to PPI. She denies any indigestion symptoms. She is not any red flags such as weight loss or appetite change. Previous work-up as above. We  did discuss potential medication such as gabapentin but at this point she declines

## 2019-05-11 ENCOUNTER — Ambulatory Visit: Payer: Medicare Other

## 2019-05-16 ENCOUNTER — Ambulatory Visit: Payer: Medicare Other | Admitting: Internal Medicine

## 2019-05-17 ENCOUNTER — Encounter: Payer: Self-pay | Admitting: Internal Medicine

## 2019-05-17 ENCOUNTER — Ambulatory Visit: Payer: Medicare PPO | Admitting: Internal Medicine

## 2019-05-17 ENCOUNTER — Other Ambulatory Visit: Payer: Self-pay

## 2019-05-17 VITALS — BP 122/70 | HR 68 | Ht 59.0 in | Wt 105.2 lb

## 2019-05-17 DIAGNOSIS — I456 Pre-excitation syndrome: Secondary | ICD-10-CM

## 2019-05-17 MED ORDER — PROPRANOLOL HCL 10 MG PO TABS: ORAL_TABLET | ORAL | 3 refills | Status: DC

## 2019-05-17 NOTE — Progress Notes (Signed)
HPI Caitlin Hicks returns today for ongoing evaluation and management of SVT. She has a long h/o tachypalpitations who has had documented SVT and manifest pre-excitation.  Since I saw her last over a year ago, she has had only a few episodes of SVT for which she has documented HR's in the 180's on her Apple watch. These episodes last several minutes and then she takes a propranolol and the spells resolve. She has not had syncope and no symptoms of atrial fib.  Allergies  Allergen Reactions  . Other Rash  . Sulfonamide Derivatives Rash  . Sulfa Antibiotics Rash  . Penicillins Rash     Current Outpatient Medications  Medication Sig Dispense Refill  . cholecalciferol (VITAMIN D3) 25 MCG (1000 UT) tablet Take 1,000 Units by mouth daily.    . colchicine 0.6 MG tablet Take 0.6 mg by mouth as needed. Gout    . mesalamine (LIALDA) 1.2 G EC tablet Take 2.4 g by mouth 2 (two) times daily. Two tablets in the morning and Two tablets at bedtime    . Multiple Vitamins-Minerals (PRESERVISION AREDS 2 PO) Take by mouth 2 (two) times daily. Preservision     . propranolol (INDERAL) 10 MG tablet Take 1-2 tablets at onset of SVT.  May take up to 40 mg (4 tabs) in one hour. 40 tablet 3  . TURMERIC PO Take by mouth.     No current facility-administered medications for this visit.     Past Medical History:  Diagnosis Date  . Arthritis    "knees, fingers" (01/11/2013)  . Colitis   . Osteoporosis   . Segmental colitis (HCC)   . Ulcerative colitis   . WPW (Wolff-Parkinson-White syndrome)     ROS:   All systems reviewed and negative except as noted in the HPI.   Past Surgical History:  Procedure Laterality Date  . BREAST BIOPSY Right 02/22/1995  . CATARACT EXTRACTION W/ INTRAOCULAR LENS  IMPLANT, BILATERAL Bilateral 2-05/2011  . INGUINAL HERNIA REPAIR Right ~ 1981  . ORIF HUMERUS FRACTURE Left 01/11/2013   Procedure: LEFT ORIF PROXIMAL HUMERUS FRACTURE;  Surgeon: Senaida Lange, MD;   Location: [facility];  Service: Orthopedics;  Laterality: Left;  SAME DAY LABS  . ORIF PROXIMAL HUMERUS FRACTURE Left 01/11/2013  . TONSILLECTOMY  1948  . TUBAL LIGATION  ~ 1985     Family History  Problem Relation Age of Onset  . Osteoporosis Mother   . Prostate cancer Other      Social History   Socioeconomic History  . Marital status: Married    Spouse name: Not on file  . Number of children: Not on file  . Years of education: Not on file  . Highest education level: Not on file  Occupational History  . Not on file  Tobacco Use  . Smoking status: Never Smoker  . Smokeless tobacco: Never Used  Substance and Sexual Activity  . Alcohol use: Yes    Alcohol/week: 2.0 standard drinks    Types: 2 Glasses of wine per week    Comment: 01/11/2013 "2-3 glasses of wine/wk; some weeks not even that"  . Drug use: No  . Sexual activity: Yes  Other Topics Concern  . Not on file  Social History Narrative   Charity fundraiser for [employer]   Married         Social Determinants of Health   Financial Resource Strain:   . Difficulty of Paying Living Expenses: Not on file  Food  Insecurity:   . Worried About Charity fundraiser in the Last Year: Not on file  . Ran Out of Food in the Last Year: Not on file  Transportation Needs:   . Lack of Transportation (Medical): Not on file  . Lack of Transportation (Non-Medical): Not on file  Physical Activity:   . Days of Exercise per Week: Not on file  . Minutes of Exercise per Session: Not on file  Stress:   . Feeling of Stress : Not on file  Social Connections:   . Frequency of Communication with Friends and Family: Not on file  . Frequency of Social Gatherings with Friends and Family: Not on file  . Attends Religious Services: Not on file  . Active Member of Clubs or Organizations: Not on file  . Attends Archivist Meetings: Not on file  . Marital Status: Not on file  Intimate Partner Violence:   . Fear of Current or Ex-Partner: Not on  file  . Emotionally Abused: Not on file  . Physically Abused: Not on file  . Sexually Abused: Not on file     BP 122/70   Pulse 68   Ht 4\' 11"  (1.499 m)   Wt 105 lb 3.2 oz (47.7 kg)   SpO2 99%   BMI 21.25 kg/m   Physical Exam:  Well appearing NAD HEENT: Unremarkable Neck:  No JVD, no thyromegally Lymphatics:  No adenopathy Back:  No CVA tenderness Lungs:  Clear with no wheezes HEART:  Regular rate rhythm, no murmurs, no rubs, no clicks Abd:  soft, positive bowel sounds, no organomegally, no rebound, no guarding Ext:  2 plus pulses, no edema, no cyanosis, no clubbing Skin:  No rashes no nodules Neuro:  CN II through XII intact, motor grossly intact  EKG - NSR with WPW  Assess/Plan:  1. SVT/WPW - I have discussed the treatment options. She is fairly well controlled and is not interested in pursuing catheter ablation. She has not had syncope or evidence of atrial fib despite her manifest pre-excitation. I suspect that she has a left lateral AP. She will undergo watchful waiting.  Mikle Bosworth.D.

## 2019-05-17 NOTE — Patient Instructions (Addendum)
Medication Instructions:  Your physician recommends that you continue on your current medications as directed. Please refer to the Current Medication list given to you today.  Labwork: None ordered.  Testing/Procedures: None ordered.  Follow-Up: Your physician wants you to follow-up in: one year/as needed with Dr. Ladona Ridgel.   You will receive a reminder letter in the mail two months in advance. If you don't receive a letter, please call our office to schedule the follow-up appointment.   Any Other Special Instructions Will Be Listed Below (If Applicable).  If you need a refill on your cardiac medications before your next appointment, please call your pharmacy.

## 2019-08-25 DIAGNOSIS — Z961 Presence of intraocular lens: Secondary | ICD-10-CM | POA: Diagnosis not present

## 2019-08-25 DIAGNOSIS — H524 Presbyopia: Secondary | ICD-10-CM | POA: Diagnosis not present

## 2019-08-25 DIAGNOSIS — H353131 Nonexudative age-related macular degeneration, bilateral, early dry stage: Secondary | ICD-10-CM | POA: Diagnosis not present

## 2019-08-25 DIAGNOSIS — H43813 Vitreous degeneration, bilateral: Secondary | ICD-10-CM | POA: Diagnosis not present

## 2019-09-06 ENCOUNTER — Other Ambulatory Visit: Payer: Self-pay

## 2019-09-06 ENCOUNTER — Ambulatory Visit (INDEPENDENT_AMBULATORY_CARE_PROVIDER_SITE_OTHER): Payer: Medicare PPO | Admitting: Ophthalmology

## 2019-09-06 ENCOUNTER — Encounter (INDEPENDENT_AMBULATORY_CARE_PROVIDER_SITE_OTHER): Payer: Self-pay | Admitting: Ophthalmology

## 2019-09-06 DIAGNOSIS — H3530 Unspecified macular degeneration: Secondary | ICD-10-CM | POA: Insufficient documentation

## 2019-09-06 DIAGNOSIS — H35412 Lattice degeneration of retina, left eye: Secondary | ICD-10-CM

## 2019-09-06 DIAGNOSIS — H353131 Nonexudative age-related macular degeneration, bilateral, early dry stage: Secondary | ICD-10-CM | POA: Insufficient documentation

## 2019-09-06 DIAGNOSIS — H35372 Puckering of macula, left eye: Secondary | ICD-10-CM | POA: Diagnosis not present

## 2019-09-06 NOTE — Assessment & Plan Note (Signed)

## 2019-09-06 NOTE — Assessment & Plan Note (Signed)
The nature of age--related macular degeneration was discussed with the patient as well as the distinction between dry and wet types. Checking an Amsler Grid daily with advice to return immediately should a distortion develop, was given to the patient. The patient 's smoking status now and in the past was determined and advice based on the AREDS study was provided regarding the consumption of antioxidant supplements. AREDS 2 vitamin formulation was recommended. Consumption of dark leafy vegetables and fresh fruits of various colors was recommended. Treatment modalities for wet macular degeneration particularly the use of intravitreal injections of anti-blood vessel growth factors was discussed with the patient. Avastin, Lucentis, and Eylea are the available options. On occasion, therapy includes the use of photodynamic therapy and thermal laser. Stressed to the patient do not rub eyes. All patient questions were answered.OU minor

## 2019-09-06 NOTE — Progress Notes (Signed)
09/06/2019     CHIEF COMPLAINT Patient presents for Retina Evaluation   HISTORY OF PRESENT ILLNESS: Caitlin Hicks is a 74 y.o. female who presents to the clinic today for:   HPI    Retina Evaluation    In both eyes.  Associated Symptoms Floaters.  Negative for Flashes.  Context:  distance vision.  Treatments tried include no treatments.          Comments    Patient states that Dagoberto Ligas wanted her to establish care with Dr. Luciana Axe due to macular degeneration -  Patient states that she is not currently having any vision problems and has no complaints besides the vision being darker and blurrier in her left eye.       Last edited by Berenice Bouton on 09/06/2019  3:05 PM. (History)      Referring physician: Mckinley Jewel, MD [address] [place],  [place] [address]  HISTORICAL INFORMATION:   Selected notes from the MEDICAL RECORD NUMBER       CURRENT MEDICATIONS: No current outpatient medications on file. (Ophthalmic Drugs)   No current facility-administered medications for this visit. (Ophthalmic Drugs)   Current Outpatient Medications (Other)  Medication Sig  . cholecalciferol (VITAMIN D3) 25 MCG (1000 UT) tablet Take 1,000 Units by mouth daily.  . colchicine 0.6 MG tablet Take 0.6 mg by mouth as needed. Gout  . mesalamine (LIALDA) 1.2 G EC tablet Take 2.4 g by mouth 2 (two) times daily. Two tablets in the morning and Two tablets at bedtime  . Multiple Vitamins-Minerals (PRESERVISION AREDS 2 PO) Take by mouth 2 (two) times daily. Preservision   . propranolol (INDERAL) 10 MG tablet Take 1-2 tablets at onset of SVT.  May take up to 40 mg (4 tabs) in one hour.  . TURMERIC PO Take by mouth.   No current facility-administered medications for this visit. (Other)      REVIEW OF SYSTEMS:    ALLERGIES Allergies  Allergen Reactions  . Other Rash  . Sulfonamide Derivatives Rash  . Sulfa Antibiotics Rash  . Penicillins Rash    PAST MEDICAL HISTORY Past  Medical History:  Diagnosis Date  . Arthritis    "knees, fingers" (01/11/2013)  . Colitis   . Osteoporosis   . Segmental colitis (HCC)   . Ulcerative colitis   . WPW (Wolff-Parkinson-White syndrome)    Past Surgical History:  Procedure Laterality Date  . BREAST BIOPSY Right 02/22/1995  . CATARACT EXTRACTION W/ INTRAOCULAR LENS  IMPLANT, BILATERAL Bilateral 2-05/2011  . INGUINAL HERNIA REPAIR Right ~ 1981  . ORIF HUMERUS FRACTURE Left 01/11/2013   Procedure: LEFT ORIF PROXIMAL HUMERUS FRACTURE;  Surgeon: Senaida Lange, MD;  Location: [facility];  Service: Orthopedics;  Laterality: Left;  SAME DAY LABS  . ORIF PROXIMAL HUMERUS FRACTURE Left 01/11/2013  . TONSILLECTOMY  1948  . TUBAL LIGATION  ~ 1985    FAMILY HISTORY Family History  Problem Relation Age of Onset  . Osteoporosis Mother   . Prostate cancer Other     SOCIAL HISTORY Social History   Tobacco Use  . Smoking status: Never Smoker  . Smokeless tobacco: Never Used  Vaping Use  . Vaping Use: Never used  Substance Use Topics  . Alcohol use: Yes    Alcohol/week: 2.0 standard drinks    Types: 2 Glasses of wine per week    Comment: 01/11/2013 "2-3 glasses of wine/wk; some weeks not even that"  . Drug use: No  OPHTHALMIC EXAM:  Base Eye Exam    Visual Acuity (Snellen - Linear)      Right Left   Dist [place] 20/20-2 20/30+1   Dist ph [place]  20/25-2       Tonometry (Tonopen, 3:11 PM)      Right Left   Pressure 9 11       Pupils      Pupils Dark Light Shape React APD   Right PERRL 5 3 Round Brisk None   Left PERRL 5 3 Round Brisk None       Visual Fields (Counting fingers)      Left Right    Full Full       Extraocular Movement      Right Left    Full Full       Neuro/Psych    Oriented x3: Yes   Mood/Affect: Normal       Dilation    Both eyes: 1.0% Mydriacyl, 2.5% Phenylephrine @ 3:11 PM        Slit Lamp and Fundus Exam    External Exam      Right Left   External Normal Normal        Slit Lamp Exam      Right Left   Lids/Lashes Normal Normal   Conjunctiva/Sclera White and quiet White and quiet   Cornea Clear Clear   Anterior Chamber Deep and quiet Deep and quiet   Iris Round and reactive Round and reactive   Lens Centered posterior chamber intraocular lens Centered posterior chamber intraocular lens   Anterior Vitreous Normal Normal       Fundus Exam      Right Left   Posterior Vitreous Posterior vitreous detachment Posterior vitreous detachment   Disc Peripapillary atrophy Peripapillary atrophy, Tilted disc   C/D Ratio 0.25 0.2   Macula Normal Normal   Vessels Normal Normal   Periphery No retinal holes or tears. No retinal holes or tears., Lattice degeneration inferotemporal, no retinal breaks          IMAGING AND PROCEDURES  Imaging and Procedures for 09/06/19  OCT, Retina - OU - Both Eyes       Right Eye Quality was good. Scan locations included subfoveal. Central Foveal Thickness: 325. Findings include myopic contour.   Left Eye Quality was good. Scan locations included subfoveal. Central Foveal Thickness: 311. Findings include myopic contour, epiretinal membrane.   Notes Myopic macular contour OU, no active maculopathy in either eye.  Minimal retinal drusen noted at the layer Bruch's membrane.  Minor epiretinal membrane nasal to the foveal region OS with no topographic distortion or thickening                ASSESSMENT/PLAN:  Early stage nonexudative age-related macular degeneration of both eyes The nature of age--related macular degeneration was discussed with the patient as well as the distinction between dry and wet types. Checking an Amsler Grid daily with advice to return immediately should a distortion develop, was given to the patient. The patient 's smoking status now and in the past was determined and advice based on the AREDS study was provided regarding the consumption of antioxidant supplements. AREDS 2 vitamin formulation was  recommended. Consumption of dark leafy vegetables and fresh fruits of various colors was recommended. Treatment modalities for wet macular degeneration particularly the use of intravitreal injections of anti-blood vessel growth factors was discussed with the patient. Avastin, Lucentis, and Eylea are the available options. On occasion, therapy includes the use  of photodynamic therapy and thermal laser. Stressed to the patient do not rub eyes. All patient questions were answered.OU minor  Macular pucker, left eye The nature of macular pucker (epiretinal membrane ERM) was discussed with the patient as well as threshold criteria for vitrectomy surgery. I explained that in rare cases another surgery is needed to actually remove a second wrinkle should it regrow.  Most often, the epiretinal membrane and underlying wrinkled internal limiting membrane are removed with the first surgery, to accomplish the goals.   If the operative eye is [place] (natural lens still present), cataract surgery is often recommended prior to Vitrectomy. This will enable the retina surgeon to have the best view during surgery and the patient to obtain optimal results in the future. Treatment options were discussed. OS minor      ICD-10-CM   1. Macular pucker, left eye  H35.372 OCT, Retina - OU - Both Eyes  2. Myopic macular degeneration of both eyes  H35.30 OCT, Retina - OU - Both Eyes  3. Early stage nonexudative age-related macular degeneration of both eyes  H35.3131   4. Lattice degeneration of left retina  H35.412     1.  2.  3.  Ophthalmic Meds Ordered this visit:  No orders of the defined types were placed in this encounter.      Return in about 1 year (around 09/05/2020) for DILATE OU, OCT.  There are no Patient Instructions on file for this visit.   Explained the diagnoses, plan, and follow up with the patient and they expressed understanding.  Patient expressed understanding of the importance of proper follow  up care.   Alford Highland Dessirae Scarola M.D. Diseases & Surgery of the Retina and Vitreous [facility] 09/06/19     Abbreviations: M myopia (nearsighted); A astigmatism; H hyperopia (farsighted); P presbyopia; Mrx spectacle prescription;  CTL contact lenses; OD right eye; OS left eye; OU both eyes  XT exotropia; ET esotropia; PEK punctate epithelial keratitis; PEE punctate epithelial erosions; DES dry eye syndrome; MGD meibomian gland dysfunction; ATs artificial tears; PFAT's preservative free artificial tears; NSC nuclear sclerotic cataract; PSC posterior subcapsular cataract; ERM epi-retinal membrane; PVD posterior vitreous detachment; RD retinal detachment; DM diabetes mellitus; DR diabetic retinopathy; NPDR non-proliferative diabetic retinopathy; PDR proliferative diabetic retinopathy; CSME clinically significant macular edema; DME diabetic macular edema; dbh dot blot hemorrhages; CWS cotton wool spot; POAG primary open angle glaucoma; C/D cup-to-disc ratio; HVF humphrey visual field; GVF goldmann visual field; OCT optical coherence tomography; IOP intraocular pressure; BRVO Branch retinal vein occlusion; CRVO central retinal vein occlusion; CRAO central retinal artery occlusion; BRAO branch retinal artery occlusion; RT retinal tear; SB scleral buckle; PPV pars plana vitrectomy; VH Vitreous hemorrhage; PRP panretinal laser photocoagulation; IVK intravitreal kenalog; VMT vitreomacular traction; MH Macular hole;  NVD neovascularization of the disc; NVE neovascularization elsewhere; AREDS age related eye disease study; ARMD age related macular degeneration; POAG primary open angle glaucoma; EBMD epithelial/anterior basement membrane dystrophy; ACIOL anterior chamber intraocular lens; IOL intraocular lens; PCIOL posterior chamber intraocular lens; Phaco/IOL phacoemulsification with intraocular lens placement; PRK photorefractive keratectomy; LASIK laser assisted in situ keratomileusis; HTN  hypertension; DM diabetes mellitus; COPD chronic obstructive pulmonary disease

## 2019-09-07 ENCOUNTER — Encounter: Payer: Self-pay | Admitting: Family Medicine

## 2019-09-07 ENCOUNTER — Ambulatory Visit (INDEPENDENT_AMBULATORY_CARE_PROVIDER_SITE_OTHER): Payer: Medicare PPO | Admitting: Family Medicine

## 2019-09-07 VITALS — BP 126/76 | HR 65 | Temp 97.9°F | Wt 107.0 lb

## 2019-09-07 DIAGNOSIS — R0989 Other specified symptoms and signs involving the circulatory and respiratory systems: Secondary | ICD-10-CM

## 2019-09-07 NOTE — Progress Notes (Signed)
Established Patient Office Visit  Subjective:  Patient ID: Caitlin Hicks, female    DOB: 07-30-1945  Age: 74 y.o. MRN: [id number]  CC:  Chief Complaint  Patient presents with  . pressure in throat    Pt states pressure in throat has been getting worse     HPI Caitlin Hicks presents for persistent globus type sensation in her lower neck region for the past 1-1/2 years.  She describes this as a "pressure sensation ".  Sometimes relieved with burping.  She has not had any evidence for dysphagia or odynophagia.  Her symptoms are actually improved with eating.  She has not had any obvious GERD symptoms.  She went to ENT over a year ago and had nasolaryngoscopy and apparently there was some concern for possible GERD related changes.  She took Nexium but only for couple weeks.  She continued to have symptoms and last November saw Dr. Earlean Shawl for EGD.  This showed some mild gastritis changes.  No esophageal concerns.  Appetite and weight are stable.  She has not noted any neck masses.  No hoarseness.  Occasional mild sore throat symptoms.  There was mention by GI of consideration for barium swallow, 24-hour pH, esophageal manometry for persistent symptoms.  She had helicobacter pylori testing that was reportedly negative.  Previous GI and ENT evaluations reviewed.   Generally feels well otherwise.  Denies specific stress issues.  Exercising without difficulty.  Past Medical History:  Diagnosis Date  . Arthritis    "knees, fingers" (01/11/2013)  . Colitis   . Osteoporosis   . Segmental colitis ([place])   . Ulcerative colitis   . WPW (Wolff-Parkinson-White syndrome)     Past Surgical History:  Procedure Laterality Date  . BREAST BIOPSY Right 02/22/1995  . CATARACT EXTRACTION W/ INTRAOCULAR LENS  IMPLANT, BILATERAL Bilateral 2-05/2011  . INGUINAL HERNIA REPAIR Right ~ 1981  . ORIF HUMERUS FRACTURE Left 01/11/2013   Procedure: LEFT ORIF PROXIMAL HUMERUS FRACTURE;  Surgeon: Marin Shutter, MD;   Location: [facility];  Service: Orthopedics;  Laterality: Left;  SAME DAY LABS  . ORIF PROXIMAL HUMERUS FRACTURE Left 01/11/2013  . TONSILLECTOMY  1948  . TUBAL LIGATION  ~ 1985    Family History  Problem Relation Age of Onset  . Osteoporosis Mother   . Prostate cancer Other     Social History   Socioeconomic History  . Marital status: Married    Spouse name: Not on file  . Number of children: Not on file  . Years of education: Not on file  . Highest education level: Not on file  Occupational History  . Not on file  Tobacco Use  . Smoking status: Never Smoker  . Smokeless tobacco: Never Used  Vaping Use  . Vaping Use: Never used  Substance and Sexual Activity  . Alcohol use: Yes    Alcohol/week: 2.0 standard drinks    Types: 2 Glasses of wine per week    Comment: 01/11/2013 "2-3 glasses of wine/wk; some weeks not even that"  . Drug use: No  . Sexual activity: Yes  Other Topics Concern  . Not on file  Social History Narrative   Therapist, sports for [employer]   Married         Social Determinants of Health   Financial Resource Strain:   . Difficulty of Paying Living Expenses:   Food Insecurity:   . Worried About Charity fundraiser in the Last Year:   . [employer]  of Food in the Last Year:   Transportation Needs:   . Freight forwarder (Medical):   Marland Kitchen Lack of Transportation (Non-Medical):   Physical Activity:   . Days of Exercise per Week:   . Minutes of Exercise per Session:   Stress:   . Feeling of Stress :   Social Connections:   . Frequency of Communication with Friends and Family:   . Frequency of Social Gatherings with Friends and Family:   . Attends Religious Services:   . Active Member of Clubs or Organizations:   . Attends Banker Meetings:   Marland Kitchen Marital Status:   Intimate Partner Violence:   . Fear of Current or Ex-Partner:   . Emotionally Abused:   Marland Kitchen Physically Abused:   . Sexually Abused:     Outpatient Medications Prior to Visit    Medication Sig Dispense Refill  . cholecalciferol (VITAMIN D3) 25 MCG (1000 UT) tablet Take 1,000 Units by mouth daily.    . colchicine 0.6 MG tablet Take 0.6 mg by mouth as needed. Gout    . mesalamine (LIALDA) 1.2 G EC tablet Take 2.4 g by mouth 2 (two) times daily. Two tablets in the morning and Two tablets at bedtime    . Multiple Vitamins-Minerals (PRESERVISION AREDS 2 PO) Take by mouth 2 (two) times daily. Preservision     . propranolol (INDERAL) 10 MG tablet Take 1-2 tablets at onset of SVT.  May take up to 40 mg (4 tabs) in one hour. 40 tablet 3  . TURMERIC PO Take by mouth.     No facility-administered medications prior to visit.    Allergies  Allergen Reactions  . Other Rash  . Sulfonamide Derivatives Rash  . Sulfa Antibiotics Rash  . Penicillins Rash    ROS Review of Systems  Constitutional: Negative for appetite change, chills, fever and unexpected weight change.  HENT: Negative for trouble swallowing and voice change.   Respiratory: Negative for cough and shortness of breath.   Cardiovascular: Negative for chest pain.  Hematological: Negative for adenopathy.      Objective:    Physical Exam Vitals reviewed.  Constitutional:      Appearance: Normal appearance.  Cardiovascular:     Rate and Rhythm: Normal rate and regular rhythm.  Pulmonary:     Effort: Pulmonary effort is normal.     Breath sounds: Normal breath sounds.  Musculoskeletal:     Cervical back: Neck supple.  Lymphadenopathy:     Cervical: No cervical adenopathy.  Neurological:     Mental Status: She is alert.     BP 126/76 (BP Location: Left Arm, Patient Position: Sitting, Cuff Size: Normal)   Pulse 65   Temp 97.9 F (36.6 C) (Temporal)   Wt 107 lb (48.5 kg)   SpO2 97%   BMI 21.61 kg/m  Wt Readings from Last 3 Encounters:  09/07/19 107 lb (48.5 kg)  05/17/19 105 lb 3.2 oz (47.7 kg)  05/02/19 104 lb 6.4 oz (47.4 kg)     Health Maintenance Due  Topic Date Due  . Hepatitis C  Screening  Never done  . COVID-19 Vaccine (1) Never done    There are no preventive care reminders to display for this patient.  Lab Results  Component Value Date   TSH 6.49 (H) 05/02/2019   Lab Results  Component Value Date   WBC 4.3 11/06/2017   HGB 13.3 11/06/2017   HCT 40.4 11/06/2017   MCV 85.0 11/06/2017   PLT  221.0 11/06/2017   Lab Results  Component Value Date   NA 141 11/06/2017   K 4.3 11/06/2017   CO2 30 11/06/2017   GLUCOSE 83 11/06/2017   BUN 17 11/06/2017   CREATININE 0.80 11/06/2017   BILITOT 0.7 11/06/2017   ALKPHOS 78 11/06/2017   AST 14 11/06/2017   ALT 11 11/06/2017   PROT 6.2 11/06/2017   ALBUMIN 4.3 11/06/2017   CALCIUM 9.8 11/06/2017   ANIONGAP 8 03/09/2017   GFR 74.43 11/06/2017   Lab Results  Component Value Date   CHOL 167 11/06/2017   Lab Results  Component Value Date   HDL 47.40 11/06/2017   Lab Results  Component Value Date   LDLCALC 96 11/06/2017   Lab Results  Component Value Date   TRIG 116.0 11/06/2017   Lab Results  Component Value Date   CHOLHDL 4 11/06/2017   No results found for: HGBA1C    Assessment & Plan:   Persistent globus sensation lower neck.  She has had fairly extensive work-up thus far with nasolaryngoscopy and EGD.  She does not have any red flag symptoms such as weight loss, appetite change, odynophagia, dysphagia.  There has been question raised of possibly some reflux related changes on her nasolaryngoscopy and she never really did adequate trial of acid suppression  -We recommended she consider Nexium 20 mg daily for minimum of 4 to 6 weeks to see if this makes a difference -Continue to avoid eating right before bedtime and other measures to reduce GERD risk. -Touch base for any persistent or worsening symptoms  No orders of the defined types were placed in this encounter.   Follow-up: No follow-ups on file.    Evelena Peat, MD

## 2019-09-07 NOTE — Patient Instructions (Signed)
Globus Pharyngeus Globus pharyngeus is a condition that makes it feel like you have a lump in your throat. It may also feel like you have something stuck in the front of your throat. This feeling may come and go. It is not painful, and it does not make it harder to swallow food or liquid. Globus pharyngeus does not cause changes that a health care provider can see during a physical exam. This condition usually goes away without treatment. What are the causes? Often, no cause can be found. The most common cause of globus pharyngeus is a condition that causes stomach juices to flow back up into the throat (gastroesophageal reflux). Other possible causes include:  Overstimulation of nerves that control swallowing.  Irritation of nerves that control swallowing (neuralgia).  An enlarged gland in the lower neck (thyroid gland).  Growth of tonsil tissue at the base of the tongue (lingual tonsil).  Anxiety.  Depression. What are the signs or symptoms? The main symptom of this condition is a feeling of a lump in your throat. This feeling usually comes and goes. How is this diagnosed? This condition may be diagnosed after other conditions have been ruled out. You may have tests, such as:  A swallow study.  Ear, nose, and throat evaluation.  An exam of your throat using a thin, flexible tube with a light and camera on the end (endoscopy). How is this treated? This condition may go away on its own, without treatment. In some cases, antidepressant medicines may be helpful. Follow these instructions at home:   Follow instructions from your health care provider about eating or drinking restrictions.  Take over-the-counter and prescription medicines only as told by your health care provider.  Keep all follow-up visits as told by your health care provider. This is important.  Follow instructions from your health care provider about home care for gastroesophageal reflux. Your health care provider  may recommend that you: ? Do not eat or drink anything that causes heartburn. ? Do not eat heavy meals close to bedtime. ? Do not drink caffeine. ? Do not drink alcohol. ? Raise the head of your bed. ? Sleep on your left side. Contact a health care provider if:  Your symptoms get worse.  You have throat pain.  You have trouble swallowing.  Food or liquid comes back up into your mouth.  You lose weight without trying. Get help right away if:  You develop swelling in your throat. Summary  Globus pharyngeus is a condition that makes it feel like you have a lump in your throat.  This condition usually goes away without treatment. This information is not intended to replace advice given to you by your health care provider. Make sure you discuss any questions you have with your health care provider. Document Revised: 02/20/2017 Document Reviewed: 11/14/2015 Elsevier Patient Education  2020 [employer].  Consider trial of daily Nexium 20 mg daily for at least 4 to 6 weeks.

## 2019-10-04 DIAGNOSIS — Z20822 Contact with and (suspected) exposure to covid-19: Secondary | ICD-10-CM | POA: Diagnosis not present

## 2019-11-23 ENCOUNTER — Other Ambulatory Visit: Payer: Self-pay

## 2019-11-23 ENCOUNTER — Ambulatory Visit (INDEPENDENT_AMBULATORY_CARE_PROVIDER_SITE_OTHER): Payer: Medicare PPO

## 2019-11-23 DIAGNOSIS — Z0000 Encounter for general adult medical examination without abnormal findings: Secondary | ICD-10-CM

## 2019-11-23 NOTE — Progress Notes (Signed)
Subjective:   Caitlin Hicks is a 74 y.o. female who presents for Medicare Annual (Subsequent) preventive examination.  I connected with Caitlin Hicks today by telephone and verified that I am speaking with the correct person using two identifiers. Location patient: home Location provider: work Persons participating in the virtual visit: patient, provider.   I discussed the limitations, risks, security and privacy concerns of performing an evaluation and management service by telephone and the availability of in person appointments. I also discussed with the patient that there may be a patient responsible charge related to this service. The patient expressed understanding and verbally consented to this telephonic visit.    Interactive audio and video telecommunications were attempted between this provider and patient, however failed, due to patient having technical difficulties OR patient did not have access to video capability.  We continued and completed visit with audio only.     Review of Systems    N/A Cardiac Risk Factors include: advanced age (>84men, >37 women)     Objective:    Today's Vitals   There is no height or weight on file to calculate BMI.  Advanced Directives 11/23/2019 03/09/2017 06/13/2016 04/28/2016 09/11/2015 09/28/2014 09/01/2014  Does Patient Have a Medical Advance Directive? Yes No Yes Yes No No No  Type of Estate agent of [place];Living will - Healthcare Power of [place];Living will Healthcare Power of [place];Living will - - -  Does patient want to make changes to medical advance directive? No - Patient declined - - - - - -  Copy of Healthcare Power of Attorney in Chart? No - copy requested - - - - - -  Would patient like information on creating a medical advance directive? - - - - No - patient declined information No - patient declined information No - patient declined information    Current Medications (verified) Outpatient Encounter  Medications as of 11/23/2019  Medication Sig  . cholecalciferol (VITAMIN D3) 25 MCG (1000 UT) tablet Take 1,000 Units by mouth daily.  . mesalamine (LIALDA) 1.2 G EC tablet Take 2.4 g by mouth 2 (two) times daily. Two tablets in the morning and Two tablets at bedtime  . Multiple Vitamins-Minerals (PRESERVISION AREDS 2 PO) Take by mouth 2 (two) times daily. Preservision   . TURMERIC PO Take by mouth.  . colchicine 0.6 MG tablet Take 0.6 mg by mouth as needed. Gout (Patient not taking: Reported on 11/23/2019)  . propranolol (INDERAL) 10 MG tablet Take 1-2 tablets at onset of SVT.  May take up to 40 mg (4 tabs) in one hour. (Patient not taking: Reported on 11/23/2019)   No facility-administered encounter medications on file as of 11/23/2019.    Allergies (verified) Other, Sulfonamide derivatives, Sulfa antibiotics, and Penicillins   History: Past Medical History:  Diagnosis Date  . Arthritis    "knees, fingers" (01/11/2013)  . Colitis   . Osteoporosis   . Segmental colitis (HCC)   . Ulcerative colitis   . WPW (Wolff-Parkinson-White syndrome)    Past Surgical History:  Procedure Laterality Date  . BREAST BIOPSY Right 02/22/1995  . CATARACT EXTRACTION W/ INTRAOCULAR LENS  IMPLANT, BILATERAL Bilateral 2-05/2011  . INGUINAL HERNIA REPAIR Right ~ 1981  . ORIF HUMERUS FRACTURE Left 01/11/2013   Procedure: LEFT ORIF PROXIMAL HUMERUS FRACTURE;  Surgeon: Senaida Lange, MD;  Location: [facility] OR;  Service: Orthopedics;  Laterality: Left;  SAME DAY LABS  . ORIF PROXIMAL HUMERUS FRACTURE Left 01/11/2013  . TONSILLECTOMY  1948  .  TUBAL LIGATION  ~ 1985   Family History  Problem Relation Age of Onset  . Osteoporosis Mother   . Prostate cancer Other    Social History   Socioeconomic History  . Marital status: Married    Spouse name: Not on file  . Number of children: Not on file  . Years of education: Not on file  . Highest education level: Not on file  Occupational History  . Not on file   Tobacco Use  . Smoking status: Never Smoker  . Smokeless tobacco: Never Used  Vaping Use  . Vaping Use: Never used  Substance and Sexual Activity  . Alcohol use: Yes    Alcohol/week: 2.0 standard drinks    Types: 2 Glasses of wine per week    Comment: 01/11/2013 "2-3 glasses of wine/wk; some weeks not even that"  . Drug use: No  . Sexual activity: Yes  Other Topics Concern  . Not on file  Social History Narrative   RN for [employer]   Married         Social Determinants of Health   Financial Resource Strain: Low Risk   . Difficulty of Paying Living Expenses: Not hard at all  Food Insecurity: No Food Insecurity  . Worried About Programme researcher, broadcasting/film/videounning Out of Food in the Last Year: Never true  . Ran Out of Food in the Last Year: Never true  Transportation Needs: No Transportation Needs  . Lack of Transportation (Medical): No  . Lack of Transportation (Non-Medical): No  Physical Activity: Sufficiently Active  . Days of Exercise per Week: 7 days  . Minutes of Exercise per Session: 40 min  Stress: No Stress Concern Present  . Feeling of Stress : Not at all  Social Connections: Moderately Integrated  . Frequency of Communication with Friends and Family: More than three times a week  . Frequency of Social Gatherings with Friends and Family: Twice a week  . Attends Religious Services: More than 4 times per year  . Active Member of Clubs or Organizations: No  . Attends BankerClub or Organization Meetings: Never  . Marital Status: Married    Tobacco Counseling Counseling given: Not Answered   Clinical Intake:  Pre-visit preparation completed: Yes  Pain : No/denies pain     Nutritional Risks: None Diabetes: No  How often do you need to have someone help you when you read instructions, pamphlets, or other written materials from your doctor or pharmacy?: 1 - Never What is the last grade level you completed in school?: MSN  Diabetic?No  Interpreter Needed?: No  Information entered  by :: SCrews,LPN   Activities of Daily Living In your present state of health, do you have any difficulty performing the following activities: 11/23/2019  Hearing? N  Vision? N  Difficulty concentrating or making decisions? N  Walking or climbing stairs? N  Dressing or bathing? N  Doing errands, shopping? N  Preparing Food and eating ? N  Using the Toilet? N  In the past six months, have you accidently leaked urine? N  Do you have problems with loss of bowel control? N  Managing your Medications? N  Managing your Finances? N  Housekeeping or managing your Housekeeping? N  Some recent data might be hidden    Patient Care Team: Kristian CoveyBurchette, Bruce W, MD as PCP - General  Indicate any recent Medical Services you may have received from other than [facility] providers in the past year (date may be approximate).     Assessment:  This is a routine wellness examination for Dyana.  Hearing/Vision screen  Hearing Screening   125Hz  250Hz  500Hz  1000Hz  2000Hz  3000Hz  4000Hz  6000Hz  8000Hz   Right ear:           Left ear:           Vision Screening Comments: Patient states gets eyes checked annually    Dietary issues and exercise activities discussed: Current Exercise Habits: Home exercise routine, Type of exercise: stretching;treadmill;strength training/weights, Time (Minutes): 40, Frequency (Times/Week): 7, Weekly Exercise (Minutes/Week): 280, Intensity: Mild, Exercise limited by: None identified  Goals    . Patient Stated     I will continue to lift weights, stretching, and walking daily      Depression Screen PHQ 2/9 Scores 11/23/2019 04/27/2019 11/06/2017 07/01/2016 04/28/2016 09/11/2015 09/28/2014  PHQ - 2 Score 0 0 0 0 0 0 0  PHQ- 9 Score 0 0 - - - - -  Exception Documentation - - - - - - Other- indicate reason in comment box    Fall Risk Fall Risk  11/23/2019 05/02/2019 11/06/2017 07/01/2016 04/28/2016  Falls in the past year? 0 0 No No No  Number falls in past yr: 0 - - - -  Injury with Fall? 0 -  - - -  Risk for fall due to : No Fall Risks - - - -  Follow up Falls evaluation completed;Falls prevention discussed - - - -    Any stairs in or around the home? Yes  If so, are there any without handrails? No  Home free of loose throw rugs in walkways, pet beds, electrical cords, etc? Yes  Adequate lighting in your home to reduce risk of falls? Yes   ASSISTIVE DEVICES UTILIZED TO PREVENT FALLS:  Life alert? Yes  Use of a cane, walker or w/c? No  Grab bars in the bathroom? Yes  Shower chair or bench in shower? No  Elevated toilet seat or a handicapped toilet? No    Cognitive Function:  Cognitive screening not indicated based on direct observation      Immunizations Immunization History  Administered Date(s) Administered  . Influenza Split 01/23/2011  . Influenza Whole 12/22/2009, 01/03/2014  . Influenza, High Dose Seasonal PF 02/07/2016, 01/22/2017, 01/08/2018  . Influenza-Unspecified 01/05/2013, 12/23/2014, 01/07/2019  . PFIZER SARS-COV-2 Vaccination 04/13/2019, 05/04/2019  . Pneumococcal Conjugate-13 12/09/2013  . Pneumococcal Polysaccharide-23 06/28/2008  . Tdap 12/09/2013  . Zoster 03/25/2007    TDAP status: Up to date Flu Vaccine status: Up to date Pneumococcal vaccine status: Up to date Covid-19 vaccine status: Completed vaccines  Qualifies for Shingles Vaccine? Yes   Zostavax completed Yes   Shingrix Completed?: No.    Education has been provided regarding the importance of this vaccine. Patient has been advised to call insurance company to determine out of pocket expense if they have not yet received this vaccine. Advised may also receive vaccine at local pharmacy or Health Dept. Verbalized acceptance and understanding.  Screening Tests Health Maintenance  Topic Date Due  . Hepatitis C Screening  Never done  . INFLUENZA VACCINE  10/23/2019  . TETANUS/TDAP  12/10/2023  . DEXA SCAN  Completed  . COVID-19 Vaccine  Completed  . PNA vac Low Risk Adult   Completed    Health Maintenance  Health Maintenance Due  Topic Date Due  . Hepatitis C Screening  Never done  . INFLUENZA VACCINE  10/23/2019    Colorectal cancer screening: No longer required.  Mammogram Status: Patient refuses states that she has  not had mammogram in 12 years and does not plan to get another one Bone Density status: Completed 04/19/2019. Results reflect: Bone density results: OSTEOPOROSIS. Repeat every 2 years.  Lung Cancer Screening: (Low Dose CT Chest recommended if Age 42-80 years, 30 pack-year currently smoking OR have quit w/in 15years.) does not qualify.   Lung Cancer Screening Referral: N/A  Additional Screening:  Hepatitis C Screening: does qualify;   Vision Screening: Recommended annual ophthalmology exams for early detection of glaucoma and other disorders of the eye. Is the patient up to date with their annual eye exam?  Yes  Who is the provider or what is the name of the office in which the patient attends annual eye exams? Dr.Stoneburner If pt is not established with a provider, would they like to be referred to a provider to establish care? No .   Dental Screening: Recommended annual dental exams for proper oral hygiene  Community Resource Referral / Chronic Care Management: CRR required this visit?  No   CCM required this visit?  No      Plan:     I have personally reviewed and noted the following in the patient's chart:   . Medical and social history . Use of alcohol, tobacco or illicit drugs  . Current medications and supplements . Functional ability and status . Nutritional status . Physical activity . Advanced directives . List of other physicians . Hospitalizations, surgeries, and ER visits in previous 12 months . Vitals . Screenings to include cognitive, depression, and falls . Referrals and appointments  In addition, I have reviewed and discussed with patient certain preventive protocols, quality metrics, and best  practice recommendations. A written personalized care plan for preventive services as well as general preventive health recommendations were provided to patient.     Theodora Blow, LPN   05/24/4399   Nurse Notes: None

## 2019-11-23 NOTE — Patient Instructions (Signed)
Caitlin Hicks , Thank you for taking time to come for your Medicare Wellness Visit. I appreciate your ongoing commitment to your health goals. Please review the following plan we discussed and let me know if I can assist you in the future.   Screening recommendations/referrals: Colonoscopy: Up to date, next colonoscopy due with [facility] 02/15/2020 Mammogram: Patient refused Bone Density: Up to date, next due 04/18/2021 Recommended yearly ophthalmology/optometry visit for glaucoma screening and checkup Recommended yearly dental visit for hygiene and checkup  Vaccinations: Influenza vaccine: Up to date, next due this fall Pneumococcal vaccine: Completed series Tdap vaccine: Up to date, next due 12/10/2023 Shingles vaccine: Completed series    Advanced directives: Please bring a copy of your Medical Advanced Directives into the office so that we may scan them into your chart.  Conditions/risks identified: None  Next appointment: None   Preventive Care 76 Years and Older, Female Preventive care refers to lifestyle choices and visits with your health care provider that can promote health and wellness. What does preventive care include?  A yearly physical exam. This is also called an annual well check.  Dental exams once or twice a year.  Routine eye exams. Ask your health care provider how often you should have your eyes checked.  Personal lifestyle choices, including:  Daily care of your teeth and gums.  Regular physical activity.  Eating a healthy diet.  Avoiding tobacco and drug use.  Limiting alcohol use.  Practicing safe sex.  Taking low-dose aspirin every day.  Taking vitamin and mineral supplements as recommended by your health care provider. What happens during an annual well check? The services and screenings done by your health care provider during your annual well check will depend on your age, overall health, lifestyle risk factors, and family history of  disease. Counseling  Your health care provider may ask you questions about your:  Alcohol use.  Tobacco use.  Drug use.  Emotional well-being.  Home and relationship well-being.  Sexual activity.  Eating habits.  History of falls.  Memory and ability to understand (cognition).  Work and work Astronomer.  Reproductive health. Screening  You may have the following tests or measurements:  Height, weight, and BMI.  Blood pressure.  Lipid and cholesterol levels. These may be checked every 5 years, or more frequently if you are over 49 years old.  Skin check.  Lung cancer screening. You may have this screening every year starting at age 76 if you have a 30-pack-year history of smoking and currently smoke or have quit within the past 15 years.  Fecal occult blood test (FOBT) of the stool. You may have this test every year starting at age 21.  Flexible sigmoidoscopy or colonoscopy. You may have a sigmoidoscopy every 5 years or a colonoscopy every 10 years starting at age 76.  Hepatitis C blood test.  Hepatitis B blood test.  Sexually transmitted disease (STD) testing.  Diabetes screening. This is done by checking your blood sugar (glucose) after you have not eaten for a while (fasting). You may have this done every 76-3 years.  Bone density scan. This is done to screen for osteoporosis. You may have this done starting at age 76.  Mammogram. This may be done every 1-2 years. Talk to your health care provider about how often you should have regular mammograms. Talk with your health care provider about your test results, treatment options, and if necessary, the need for more tests. Vaccines  Your health care provider may recommend  certain vaccines, such as:  Influenza vaccine. This is recommended every year.  Tetanus, diphtheria, and acellular pertussis (Tdap, Td) vaccine. You may need a Td booster every 10 years.  Zoster vaccine. You may need this after age  76.  Pneumococcal 13-valent conjugate (PCV13) vaccine. One dose is recommended after age 76.  Pneumococcal polysaccharide (PPSV23) vaccine. One dose is recommended after age 76. Talk to your health care provider about which screenings and vaccines you need and how often you need them. This information is not intended to replace advice given to you by your health care provider. Make sure you discuss any questions you have with your health care provider. Document Released: 04/06/2015 Document Revised: 11/28/2015 Document Reviewed: 01/09/2015 Elsevier Interactive Patient Education  2017 [place] Prevention in the Home Falls can cause injuries. They can happen to people of all ages. There are many things you can do to make your home safe and to help prevent falls. What can I do on the outside of my home?  Regularly fix the edges of walkways and driveways and fix any cracks.  Remove anything that might make you trip as you walk through a door, such as a raised step or threshold.  Trim any bushes or trees on the path to your home.  Use bright outdoor lighting.  Clear any walking paths of anything that might make someone trip, such as rocks or tools.  Regularly check to see if handrails are loose or broken. Make sure that both sides of any steps have handrails.  Any raised decks and porches should have guardrails on the edges.  Have any leaves, snow, or ice cleared regularly.  Use sand or salt on walking paths during winter.  Clean up any spills in your garage right away. This includes oil or grease spills. What can I do in the bathroom?  Use night lights.  Install grab bars by the toilet and in the tub and shower. Do not use towel bars as grab bars.  Use non-skid mats or decals in the tub or shower.  If you need to sit down in the shower, use a plastic, non-slip stool.  Keep the floor dry. Clean up any water that spills on the floor as soon as it happens.  Remove  soap buildup in the tub or shower regularly.  Attach bath mats securely with double-sided non-slip rug tape.  Do not have throw rugs and other things on the floor that can make you trip. What can I do in the bedroom?  Use night lights.  Make sure that you have a light by your bed that is easy to reach.  Do not use any sheets or blankets that are too big for your bed. They should not hang down onto the floor.  Have a firm chair that has side arms. You can use this for support while you get dressed.  Do not have throw rugs and other things on the floor that can make you trip. What can I do in the kitchen?  Clean up any spills right away.  Avoid walking on wet floors.  Keep items that you use a lot in easy-to-reach places.  If you need to reach something above you, use a strong step stool that has a grab bar.  Keep electrical cords out of the way.  Do not use floor polish or wax that makes floors slippery. If you must use wax, use non-skid floor wax.  Do not have throw rugs and other  things on the floor that can make you trip. What can I do with my stairs?  Do not leave any items on the stairs.  Make sure that there are handrails on both sides of the stairs and use them. Fix handrails that are broken or loose. Make sure that handrails are as long as the stairways.  Check any carpeting to make sure that it is firmly attached to the stairs. Fix any carpet that is loose or worn.  Avoid having throw rugs at the top or bottom of the stairs. If you do have throw rugs, attach them to the floor with carpet tape.  Make sure that you have a light switch at the top of the stairs and the bottom of the stairs. If you do not have them, ask someone to add them for you. What else can I do to help prevent falls?  Wear shoes that:  Do not have high heels.  Have rubber bottoms.  Are comfortable and fit you well.  Are closed at the toe. Do not wear sandals.  If you use a  stepladder:  Make sure that it is fully opened. Do not climb a closed stepladder.  Make sure that both sides of the stepladder are locked into place.  Ask someone to hold it for you, if possible.  Clearly mark and make sure that you can see:  Any grab bars or handrails.  First and last steps.  Where the edge of each step is.  Use tools that help you move around (mobility aids) if they are needed. These include:  Canes.  Walkers.  Scooters.  Crutches.  Turn on the lights when you go into a dark area. Replace any light bulbs as soon as they burn out.  Set up your furniture so you have a clear path. Avoid moving your furniture around.  If any of your floors are uneven, fix them.  If there are any pets around you, be aware of where they are.  Review your medicines with your doctor. Some medicines can make you feel dizzy. This can increase your chance of falling. Ask your doctor what other things that you can do to help prevent falls. This information is not intended to replace advice given to you by your health care provider. Make sure you discuss any questions you have with your health care provider. Document Released: 01/04/2009 Document Revised: 08/16/2015 Document Reviewed: 04/14/2014 Elsevier Interactive Patient Education  2017 [employer].

## 2020-01-05 DIAGNOSIS — R198 Other specified symptoms and signs involving the digestive system and abdomen: Secondary | ICD-10-CM | POA: Diagnosis not present

## 2020-01-05 DIAGNOSIS — R110 Nausea: Secondary | ICD-10-CM | POA: Diagnosis not present

## 2020-01-05 DIAGNOSIS — Z8719 Personal history of other diseases of the digestive system: Secondary | ICD-10-CM | POA: Diagnosis not present

## 2020-02-14 DIAGNOSIS — K219 Gastro-esophageal reflux disease without esophagitis: Secondary | ICD-10-CM | POA: Diagnosis not present

## 2020-02-14 DIAGNOSIS — J3489 Other specified disorders of nose and nasal sinuses: Secondary | ICD-10-CM | POA: Diagnosis not present

## 2020-03-01 ENCOUNTER — Other Ambulatory Visit: Payer: Self-pay

## 2020-03-01 ENCOUNTER — Encounter: Payer: Self-pay | Admitting: Podiatry

## 2020-03-01 ENCOUNTER — Ambulatory Visit: Payer: Medicare PPO | Admitting: Podiatry

## 2020-03-01 DIAGNOSIS — M2040 Other hammer toe(s) (acquired), unspecified foot: Secondary | ICD-10-CM

## 2020-03-01 DIAGNOSIS — K5130 Ulcerative (chronic) rectosigmoiditis without complications: Secondary | ICD-10-CM | POA: Diagnosis not present

## 2020-03-01 DIAGNOSIS — K219 Gastro-esophageal reflux disease without esophagitis: Secondary | ICD-10-CM | POA: Diagnosis not present

## 2020-03-01 DIAGNOSIS — Z86010 Personal history of colonic polyps: Secondary | ICD-10-CM | POA: Diagnosis not present

## 2020-03-01 DIAGNOSIS — L84 Corns and callosities: Secondary | ICD-10-CM | POA: Diagnosis not present

## 2020-03-01 DIAGNOSIS — R198 Other specified symptoms and signs involving the digestive system and abdomen: Secondary | ICD-10-CM | POA: Diagnosis not present

## 2020-03-02 NOTE — Progress Notes (Signed)
Subjective:   Patient ID: Caitlin Hicks, female   DOB: 74 y.o.   MRN: [id number]   HPI Patient presents stating she has developed painful corns on the ends of her toes and is also concerned about hammertoe deformity and whether or not correction should be considered   ROS      Objective:  Physical Exam  Neurovascular status intact no other changes in health history was significant distal deformity of the lesser digits with plantar flexion at the distal to phalangeal joint rigid in nature.  Also noted to have keratotic lesions that become very painful     Assessment:  Digital deformity bilateral with rigid contracture with keratotic lesions painful when pressed      Plan:  H&P condition reviewed and today I discussed distal arthroplasty explaining bone resection and recovery.  We will get a hold off on this currently debridement accomplished and she will be seen back as needed and if symptoms are persisting or getting worse surgical intervention will be necessary

## 2020-05-03 DIAGNOSIS — M170 Bilateral primary osteoarthritis of knee: Secondary | ICD-10-CM | POA: Diagnosis not present

## 2020-05-09 DIAGNOSIS — M25561 Pain in right knee: Secondary | ICD-10-CM | POA: Diagnosis not present

## 2020-05-09 DIAGNOSIS — M25562 Pain in left knee: Secondary | ICD-10-CM | POA: Diagnosis not present

## 2020-05-16 DIAGNOSIS — M25561 Pain in right knee: Secondary | ICD-10-CM | POA: Diagnosis not present

## 2020-05-16 DIAGNOSIS — M25562 Pain in left knee: Secondary | ICD-10-CM | POA: Diagnosis not present

## 2020-05-18 DIAGNOSIS — M25562 Pain in left knee: Secondary | ICD-10-CM | POA: Diagnosis not present

## 2020-05-18 DIAGNOSIS — M25561 Pain in right knee: Secondary | ICD-10-CM | POA: Diagnosis not present

## 2020-05-25 DIAGNOSIS — M25561 Pain in right knee: Secondary | ICD-10-CM | POA: Diagnosis not present

## 2020-05-25 DIAGNOSIS — M25562 Pain in left knee: Secondary | ICD-10-CM | POA: Diagnosis not present

## 2020-06-04 ENCOUNTER — Ambulatory Visit: Payer: Medicare PPO | Admitting: Family Medicine

## 2020-06-04 ENCOUNTER — Encounter: Payer: Self-pay | Admitting: Family Medicine

## 2020-06-04 ENCOUNTER — Other Ambulatory Visit: Payer: Self-pay

## 2020-06-04 VITALS — BP 124/70 | Ht 59.25 in | Wt 104.0 lb

## 2020-06-04 DIAGNOSIS — G8929 Other chronic pain: Secondary | ICD-10-CM | POA: Diagnosis not present

## 2020-06-04 DIAGNOSIS — M25562 Pain in left knee: Secondary | ICD-10-CM

## 2020-06-04 DIAGNOSIS — M25561 Pain in right knee: Secondary | ICD-10-CM | POA: Diagnosis not present

## 2020-06-04 MED ORDER — TRAMADOL HCL 50 MG PO TABS
50.0000 mg | ORAL_TABLET | Freq: Two times a day (BID) | ORAL | 0 refills | Status: DC | PRN
Start: 2020-06-04 — End: 2020-07-24

## 2020-06-04 NOTE — Progress Notes (Signed)
PCP: [name], MD  Subjective:   HPI: Patient is a 75 y.o. female here for left knee pain.  Patient has known osteoarthritis of bilateral knees. Current pain has been worse in left knee medially for about 1 month. Hurts more with prolonged walking and jogging than it used to. She saw Dr. Lequita Halt about 3 weeks ago and they discussed replacement and she started physical therapy. She did 4 visits of PT and continues to do home exercises - felt sessions were more painful and she's modifying things at home but had questions about what she can do. No new injuries. Has difficulty fully straightening right knee but can do so. Swelling of both knees but worse on left. Does not feel like pseudogout flares she has had in the past.  Past Medical History:  Diagnosis Date  . Arthritis    "knees, fingers" (01/11/2013)  . Colitis   . Osteoporosis   . Segmental colitis (HCC)   . Ulcerative colitis   . WPW (Wolff-Parkinson-White syndrome)     Current Outpatient Medications on File Prior to Visit  Medication Sig Dispense Refill  . calcium carbonate (TUMS EX) 750 MG chewable tablet Chew by mouth.    . cholecalciferol (VITAMIN D3) 25 MCG (1000 UT) tablet Take 1,000 Units by mouth daily.    . colchicine 0.6 MG tablet Take 0.6 mg by mouth as needed. Gout (Patient not taking: Reported on 11/23/2019)    . famotidine-calcium carbonate-magnesium hydroxide (PEPCID COMPLETE) 10-800-165 MG chewable tablet Chew 1 tablet by mouth daily as needed.    . mesalamine (LIALDA) 1.2 G EC tablet Take 2.4 g by mouth 2 (two) times daily. Two tablets in the morning and Two tablets at bedtime    . Multiple Vitamins-Minerals (PRESERVISION AREDS 2 PO) Take by mouth 2 (two) times daily. Preservision     . propranolol (INDERAL) 10 MG tablet Take 1-2 tablets at onset of SVT.  May take up to 40 mg (4 tabs) in one hour. (Patient not taking: Reported on 11/23/2019) 40 tablet 3  . TURMERIC PO Take by mouth.     No current  facility-administered medications on file prior to visit.    Past Surgical History:  Procedure Laterality Date  . BREAST BIOPSY Right 02/22/1995  . CATARACT EXTRACTION W/ INTRAOCULAR LENS  IMPLANT, BILATERAL Bilateral 2-05/2011  . INGUINAL HERNIA REPAIR Right ~ 1981  . ORIF HUMERUS FRACTURE Left 01/11/2013   Procedure: LEFT ORIF PROXIMAL HUMERUS FRACTURE;  Surgeon: Senaida Lange, MD;  Location: [facility] OR;  Service: Orthopedics;  Laterality: Left;  SAME DAY LABS  . ORIF PROXIMAL HUMERUS FRACTURE Left 01/11/2013  . TONSILLECTOMY  1948  . TUBAL LIGATION  ~ 1985    Allergies  Allergen Reactions  . Other Rash  . Sulfonamide Derivatives Rash  . Sulfa Antibiotics Rash  . Penicillins Rash    Social History   Socioeconomic History  . Marital status: Married    Spouse name: Not on file  . Number of children: Not on file  . Years of education: Not on file  . Highest education level: Not on file  Occupational History  . Not on file  Tobacco Use  . Smoking status: Never Smoker  . Smokeless tobacco: Never Used  Vaping Use  . Vaping Use: Never used  Substance and Sexual Activity  . Alcohol use: Yes    Alcohol/week: 2.0 standard drinks    Types: 2 Glasses of wine per week    Comment: 01/11/2013 "2-3 glasses  of wine/wk; some weeks not even that"  . Drug use: No  . Sexual activity: Yes  Other Topics Concern  . Not on file  Social History Narrative   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         Social Determinants of Health   Financial Resource Strain: Low Risk   . Difficulty of Paying Living Expenses: Not hard at all  Food Insecurity: No Food Insecurity  . Worried About Programme researcher, broadcasting/film/video in the Last Year: Never true  . Ran Out of Food in the Last Year: Never true  Transportation Needs: No Transportation Needs  . Lack of Transportation (Medical): No  . Lack of Transportation (Non-Medical): No  Physical Activity: Sufficiently Active  . Days of Exercise per Week: 7 days  . Minutes  of Exercise per Session: 40 min  Stress: No Stress Concern Present  . Feeling of Stress : Not at all  Social Connections: Moderately Integrated  . Frequency of Communication with Friends and Family: More than three times a week  . Frequency of Social Gatherings with Friends and Family: Twice a week  . Attends Religious Services: More than 4 times per year  . Active Member of Clubs or Organizations: No  . Attends Banker Meetings: Never  . Marital Status: Married  Catering manager Violence: Not At Risk  . Fear of Current or Ex-Partner: No  . Emotionally Abused: No  . Physically Abused: No  . Sexually Abused: No    Family History  Problem Relation Age of Onset  . Osteoporosis Mother   . Prostate cancer Other     BP 124/70   Ht 4' 11.25" (1.505 m)   Wt 104 lb (47.2 kg)   BMI 20.83 kg/m   No flowsheet data found.  No flowsheet data found.  Review of Systems: See HPI above.     Objective:  Physical Exam:  Gen: NAD, comfortable in exam room  Left knee: Mod effusion.  No other gross deformity, ecchymoses. TTP post patellar facets and medial joint line. ROM 0 - 90 degrees with normal strength. Negative ant/post drawers. Negative valgus/varus testing. Negative lachman. Negative mcmurrays, apleys. NV intact distally.  Right knee: Mild effusion.  Patella tracks laterally making full extension difficult but able to do so. No gross deformity, ecchymoses. Minimal TTP medial joint line. FROM with normal strength. Negative ant/post drawers. Negative valgus/varus testing. Negative lachman. Negative mcmurrays, apleys. NV intact distally.  Assessment & Plan:  1. Bilateral knee pain - 2/2 known arthritis, worse on left.  Answered her questions today including quad strengthening in a modified way to minimize increasing her pain.  Heat or ice.  Voltaren gel, tylenol.  Short course of tramadol as she's traveling to [place] for a few days to have in case pain worsens  through airport and with increased walking.  She is considering replacement as well and is followed by Dr. Lequita Halt.

## 2020-06-04 NOTE — Patient Instructions (Signed)
Your pain is due to arthritis. These are the different medications you can take for this: Tylenol 325mg  up to three times a day. Tramadol as needed for severe pain - no driving on this though. Voltaren gel topically up to four times a day may also help with pain. Some supplements that may help for arthritis: Boswellia extract, curcumin, pycnogenol Straight leg raises, knee extensions, half-squats and/or wall sits are the exercises I would focus on. Shoe inserts with good arch support may be helpful. Heat or ice 15 minutes at a time 3-4 times a day as needed to help with pain. Water aerobics and cycling with low resistance are the best two types of exercise for arthritis though any exercise is ok as long as it doesn't worsen the pain. Follow up with me as needed.

## 2020-06-21 ENCOUNTER — Telehealth: Payer: Self-pay | Admitting: *Deleted

## 2020-06-21 NOTE — Telephone Encounter (Signed)
   [facility] Pre-operative Risk Assessment    HEARTCARE STAFF: - Please ensure there is not already an duplicate clearance open for this procedure. - Under Visit Info/Reason for Call, type in Other and utilize the format Clearance MM/DD/YY or Clearance TBD. Do not use dashes or single digits. - If request is for dental extraction, please clarify the # of teeth to be extracted.  Request for surgical clearance:  1. What type of surgery is being performed? LEFT TOTAL KNEE ARTHROPLASTY   2. When is this surgery scheduled? 09/03/20   3. What type of clearance is required (medical clearance vs. Pharmacy clearance to hold med vs. Both)? MEDICAL  4. Are there any medications that need to be held prior to surgery and how long? NONE LISTED   5. Practice name and name of physician performing surgery? [facility]; DR. FRANK ALUISIO   6. What is the office phone number? [phone]   7.   What is the office fax number? [phone] ATTN: [place]  8.   Anesthesia type (None, local, MAC, general) ? CHOICE   Julaine Hua 06/21/2020, 3:53 PM  _________________________________________________________________   (provider comments below)

## 2020-06-22 NOTE — Telephone Encounter (Signed)
   Primary Cardiologist: Lewayne Bunting, MD  Chart reviewed as part of pre-operative protocol coverage. Given past medical history and time since last visit, based on ACC/AHA guidelines, Caitlin Hicks would be at acceptable risk for the planned procedure without further cardiovascular testing.   Her RCRI is a class I risk, 0.4% risk of major cardiac event.  I will route this recommendation to the requesting party via Epic fax function and remove from pre-op pool.  Please call with questions.  Thomasene Ripple. [name] NP-C    06/22/2020, 8:33 AM [facility] [address] Office [phone] Fax [phone]

## 2020-07-12 DIAGNOSIS — M25562 Pain in left knee: Secondary | ICD-10-CM | POA: Diagnosis not present

## 2020-07-13 NOTE — Patient Instructions (Addendum)
DUE TO COVID-19 ONLY ONE VISITOR IS ALLOWED TO COME WITH YOU AND STAY IN THE WAITING ROOM ONLY DURING PRE OP AND PROCEDURE DAY OF SURGERY. THE 2 VISITORS  MAY VISIT WITH YOU AFTER SURGERY IN YOUR PRIVATE ROOM DURING VISITING HOURS ONLY!  YOU NEED TO HAVE A COVID 19 TEST ON__4/28_____ @_2 :55 pm______, THIS TEST MUST BE DONE BEFORE SURGERY,  COVID TESTING SITE [address] [place] , IT IS ON THE RIGHT GOING OUT [address] APPROXIMATELY  2 MINUTES PAST [employer] ON THE RIGHT. ONCE YOUR COVID TEST IS COMPLETED,  PLEASE BEGIN THE QUARANTINE INSTRUCTIONS AS OUTLINED IN YOUR HANDOUT.                [address]    Your procedure is scheduled on: 07/23/20   Report to [facility] Main  Entrance   Report to admitting at   10:10 AM     Call this number if you have problems the morning of surgery [phone]    BRUSH YOUR TEETH MORNING OF SURGERY AND RINSE YOUR MOUTH OUT, NO CHEWING GUM CANDY OR MINTS.   No food after midnight.    You may have clear liquid until 9:30 AM.    CLEAR LIQUID DIET   Foods Allowed                                                                     Foods Excluded  Coffee and tea, regular and decaf                             liquids that you cannot  Plain Jell-O any favor except red or purple                                           see through such as: Fruit ices (not with fruit pulp)                                     milk, soups, orange juice  Iced Popsicles                                    All solid food Carbonated beverages, regular and diet                                    Cranberry, grape and apple juices Sports drinks like Gatorade Lightly seasoned clear broth or consume(fat free) Sugar, honey syrup       At 9:00 AM drink pre surgery drink.   Nothing by mouth after 9:30 AM.   Take these medicines the morning of surgery with A SIP OF WATER: Propranolol, Colchicine  You may not have any metal on your body including hair pins and              piercings  Do not wear jewelry, make-up, lotions, powders or perfumes, deodorant             Do not wear nail polish on your fingernails.  Do not shave  48 hours prior to surgery.     Do not bring valuables to the hospital. [facility] IS NOT             RESPONSIBLE   FOR VALUABLES.  Contacts, dentures or bridgework may not be worn into surgery.                [facility] - Preparing for Surgery Before surgery, you can play an important role.  Because skin is not sterile, your skin needs to be as free of germs as possible.  You can reduce the number of germs on your skin by washing with CHG (chlorahexidine gluconate) soap before surgery.  CHG is an antiseptic cleaner which kills germs and bonds with the skin to continue killing germs even after washing. Please DO NOT use if you have an allergy to CHG or antibacterial soaps.  If your skin becomes reddened/irritated stop using the CHG and inform your nurse when you arrive at Short Stay. Do not shave (including legs and underarms) for at least 48 hours prior to the first CHG shower.   Please follow these instructions carefully:  1.  Shower with CHG Soap the night before surgery and the  morning of Surgery.  2.  If you choose to wash your hair, wash your hair first as usual with your  normal  shampoo.  3.  After you shampoo, rinse your hair and body thoroughly to remove the  shampoo.                                        4.  Use CHG as you would any other liquid soap.  You can apply chg directly  to the skin and wash                       Gently with a scrungie or clean washcloth.  5.  Apply the CHG Soap to your body ONLY FROM THE NECK DOWN.   Do not use on face/ open                           Wound or open sores. Avoid contact with eyes, ears mouth and genitals (private parts).                       Wash face,  Genitals (private parts) with your normal soap.              6.  Wash thoroughly, paying special attention to the area where your surgery  will be performed.  7.  Thoroughly rinse your body with warm water from the neck down.  8.  DO NOT shower/wash with your normal soap after using and rinsing off  the CHG Soap.             9.  Pat yourself dry with a clean towel.            10.  Wear clean pajamas.  11.  Place clean sheets on your bed the night of your first shower and do not  sleep with pets. Day of Surgery : Do not apply any lotions/deodorants the morning of surgery.  Please wear clean clothes to the hospital/surgery center.  FAILURE TO FOLLOW THESE INSTRUCTIONS MAY RESULT IN THE CANCELLATION OF YOUR SURGERY PATIENT SIGNATURE_________________________________  NURSE SIGNATURE__________________________________  ________________________________________________________________________   Adam Phenix  An incentive spirometer is a tool that can help keep your lungs clear and active. This tool measures how well you are filling your lungs with each breath. Taking long deep breaths may help reverse or decrease the chance of developing breathing (pulmonary) problems (especially infection) following:  A long period of time when you are unable to move or be active. BEFORE THE PROCEDURE   If the spirometer includes an indicator to show your best effort, your nurse or respiratory therapist will set it to a desired goal.  If possible, sit up straight or lean slightly forward. Try not to slouch.  Hold the incentive spirometer in an upright position. INSTRUCTIONS FOR USE  1. Sit on the edge of your bed if possible, or sit up as far as you can in bed or on a chair. 2. Hold the incentive spirometer in an upright position. 3. Breathe out normally. 4. Place the mouthpiece in your mouth and seal your lips tightly around it. 5. Breathe in slowly and as deeply as possible, raising the piston or the ball toward the top of the  column. 6. Hold your breath for 3-5 seconds or for as long as possible. Allow the piston or ball to fall to the bottom of the column. 7. Remove the mouthpiece from your mouth and breathe out normally. 8. Rest for a few seconds and repeat Steps 1 through 7 at least 10 times every 1-2 hours when you are awake. Take your time and take a few normal breaths between deep breaths. 9. The spirometer may include an indicator to show your best effort. Use the indicator as a goal to work toward during each repetition. 10. After each set of 10 deep breaths, practice coughing to be sure your lungs are clear. If you have an incision (the cut made at the time of surgery), support your incision when coughing by placing a pillow or rolled up towels firmly against it. Once you are able to get out of bed, walk around indoors and cough well. You may stop using the incentive spirometer when instructed by your caregiver.  RISKS AND COMPLICATIONS  Take your time so you do not get dizzy or light-headed.  If you are in pain, you may need to take or ask for pain medication before doing incentive spirometry. It is harder to take a deep breath if you are having pain. AFTER USE  Rest and breathe slowly and easily.  It can be helpful to keep track of a log of your progress. Your caregiver can provide you with a simple table to help with this. If you are using the spirometer at home, follow these instructions: [place] IF:   You are having difficultly using the spirometer.  You have trouble using the spirometer as often as instructed.  Your pain medication is not giving enough relief while using the spirometer.  You develop fever of 100.5 F (38.1 C) or higher. SEEK IMMEDIATE MEDICAL CARE IF:   You cough up bloody sputum that had not been present before.  You develop fever of 102 F (38.9 C) or  greater.  You develop worsening pain at or near the incision site. MAKE SURE YOU:   Understand these  instructions.  Will watch your condition.  Will get help right away if you are not doing well or get worse. Document Released: 07/21/2006 Document Revised: 06/02/2011 Document Reviewed: 09/21/2006 [facility] Patient Information 2014 [place], [place].   ________________________________________________________________________

## 2020-07-16 ENCOUNTER — Other Ambulatory Visit: Payer: Self-pay

## 2020-07-16 ENCOUNTER — Encounter (HOSPITAL_COMMUNITY): Payer: Self-pay

## 2020-07-16 ENCOUNTER — Encounter (HOSPITAL_COMMUNITY)
Admission: RE | Admit: 2020-07-16 | Discharge: 2020-07-16 | Disposition: A | Discharge status: Home / Self Care | Payer: Medicare PPO | Source: Ambulatory Visit | Attending: Orthopedic Surgery | Admitting: Orthopedic Surgery

## 2020-07-16 DIAGNOSIS — Z01818 Encounter for other preprocedural examination: Secondary | ICD-10-CM | POA: Diagnosis not present

## 2020-07-16 HISTORY — DX: Gastro-esophageal reflux disease without esophagitis: K21.9

## 2020-07-16 HISTORY — DX: Cardiac arrhythmia, unspecified: I49.9

## 2020-07-16 LAB — PROTIME-INR
INR: 1 (ref 0.8–1.2)
Prothrombin Time: 13 seconds (ref 11.4–15.2)

## 2020-07-16 LAB — SURGICAL PCR SCREEN
MRSA, PCR: NEGATIVE
Staphylococcus aureus: NEGATIVE

## 2020-07-16 LAB — COMPREHENSIVE METABOLIC PANEL
ALT: 14 U/L (ref 0–44)
AST: 18 U/L (ref 15–41)
Albumin: 4.2 g/dL (ref 3.5–5.0)
Alkaline Phosphatase: 80 U/L (ref 38–126)
Anion gap: 7 (ref 5–15)
BUN: 19 mg/dL (ref 8–23)
CO2: 26 mmol/L (ref 22–32)
Calcium: 9.8 mg/dL (ref 8.9–10.3)
Chloride: 108 mmol/L (ref 98–111)
Creatinine, Ser: 0.66 mg/dL (ref 0.44–1.00)
GFR, Estimated: >60 mL/min (ref >60)
Glucose, Bld: 93 mg/dL (ref 70–99)
Potassium: 4.2 mmol/L (ref 3.5–5.1)
Sodium: 141 mmol/L (ref 135–145)
Total Bilirubin: 0.8 mg/dL (ref 0.3–1.2)
Total Protein: 6.6 g/dL (ref 6.5–8.1)

## 2020-07-16 LAB — CBC
HCT: 40.9 % (ref 36.0–46.0)
Hemoglobin: 13.1 g/dL (ref 12.0–15.0)
MCH: 27.9 pg (ref 26.0–34.0)
MCHC: 32 g/dL (ref 30.0–36.0)
MCV: 87.2 fL (ref 80.0–100.0)
Platelets: 224 10*3/uL (ref 150–400)
RBC: 4.69 MIL/uL (ref 3.87–5.11)
RDW: 14.9 % (ref 11.5–15.5)
WBC: 5.5 10*3/uL (ref 4.0–10.5)
nRBC: 0 % (ref 0.0–0.2)

## 2020-07-16 LAB — APTT: aPTT: 34 seconds (ref 24–36)

## 2020-07-16 NOTE — Progress Notes (Signed)
COVID Vaccine Completed:Yes Date COVID Vaccine completed:05/04/19-booster 12/2019 COVID vaccine manufacturer: Pfizer     PCP - Dr. Percival Spanish Cardiologist - Dr. Rosette Reveal  Chest x-ray - no EKG - 07/16/20-chart, epic Stress Test - no ECHO - no Cardiac Cath - no Pacemaker/ICD device last checked:NA  Sleep Study -no  CPAP -   Fasting Blood Sugar - NA Checks Blood Sugar _____ times a day  Blood Thinner Instructions:NA Aspirin Instructions: Last Dose:  Anesthesia review:   Patient denies shortness of breath, fever, cough and chest pain at PAT appointment Pt reports no SOB with any activities.  Patient verbalized understanding of instructions that were given to them at the PAT appointment. Patient was also instructed that they will need to review over the PAT instructions again at home before surgery. Yes  Pt has WPW.

## 2020-07-17 NOTE — Anesthesia Preprocedure Evaluation (Addendum)
Anesthesia Evaluation  Patient identified by MRN, date of birth, ID band Patient awake    Reviewed: Allergy & Precautions, NPO status , Patient's Chart, lab work & pertinent test results  Airway Mallampati: II  TM Distance: >3 FB Neck ROM: Full    Dental no notable dental hx.    Pulmonary neg pulmonary ROS,    Pulmonary exam normal breath sounds clear to auscultation       Cardiovascular Normal cardiovascular exam+ dysrhythmias Supra Ventricular Tachycardia  Rhythm:Regular Rate:Normal     Neuro/Psych negative neurological ROS  negative psych ROS   GI/Hepatic Neg liver ROS, GERD  Medicated,  Endo/Other  negative endocrine ROS  Renal/GU negative Renal ROS  negative genitourinary   Musculoskeletal  (+) Arthritis , Osteoarthritis,    Abdominal   Peds negative pediatric ROS (+)  Hematology negative hematology ROS (+)   Anesthesia Other Findings   Reproductive/Obstetrics negative OB ROS                            Anesthesia Physical Anesthesia Plan  ASA: II  Anesthesia Plan: Spinal   Post-op Pain Management:  Regional for Post-op pain   Induction: Intravenous  PONV Risk Score and Plan: 3 and Ondansetron, Dexamethasone, Propofol infusion and Treatment may vary due to age or medical condition  Airway Management Planned: Simple Face Mask  Additional Equipment:   Intra-op Plan:   Post-operative Plan:   Informed Consent: I have reviewed the patients History and Physical, chart, labs and discussed the procedure including the risks, benefits and alternatives for the proposed anesthesia with the patient or authorized representative who has indicated his/her understanding and acceptance.     Dental advisory given  Plan Discussed with: CRNA and Surgeon  Anesthesia Plan Comments: (Per cardiology 06/22/20, "Chart reviewed as part of pre-operative protocol coverage. Given past medical  history and time since last visit, based on ACC/AHA guidelines, SHANAE LUO would be at acceptable risk for the planned procedure without further cardiovascular testing.   Her RCRI is a class I risk, 0.4% risk of major cardiac event.")       Anesthesia Quick Evaluation

## 2020-07-19 ENCOUNTER — Other Ambulatory Visit (HOSPITAL_COMMUNITY): Payer: Medicare PPO

## 2020-07-22 MED ORDER — BUPIVACAINE LIPOSOME 1.3 % IJ SUSP
20.0000 mL | Freq: Once | INTRAMUSCULAR | Status: DC
  Filled 2020-07-22: qty 20

## 2020-07-22 NOTE — H&P (Signed)
TOTAL KNEE ADMISSION H&P  Patient is being admitted for left total knee arthroplasty.  Subjective:  Chief Complaint: Left knee pain.  HPI: Caitlin Hicks, 75 y.o. female has a history of pain and functional disability in the left knee due to arthritis and has failed non-surgical conservative treatments for greater than 12 weeks to include NSAID's and/or analgesics and activity modification. Onset of symptoms was gradual, starting several years ago with gradually worsening course since that time. The patient noted no past surgery on the left knee.  Patient currently rates pain in the left knee at 6 out of 10 with activity. Patient has worsening of pain with activity and weight bearing and pain that interferes with activities of daily living. Patient has evidence of periarticular osteophytes and joint space narrowing by imaging studies. There is no active infection.  Patient Active Problem List   Diagnosis Date Noted  . Macular pucker, left eye 09/06/2019  . Myopic macular degeneration of both eyes 09/06/2019  . Early stage nonexudative age-related macular degeneration of both eyes 09/06/2019  . Lattice degeneration of left retina 09/06/2019  . Cough, persistent 04/28/2018  . Globus pharyngeus 04/28/2018  . Pseudogout 06/17/2016  . 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 Tendinopathy of right gluteus medius 10/19/2014  . Osteoarthritis of right knee 08/30/2014  . Right shoulder pain 06/14/2014  . Osteoporosis 05/24/2014  . Hallux rigidus of right foot 05/23/2014  . Fracture of humerus, proximal, left, closed 01/12/2013  . WPW (Wolff-Parkinson-White syndrome) 11/24/2012  . Acute pain of left knee 08/19/2011  . Ulcerative colitis (HCC) 04/10/2011  . H/O calcium pyrophosphate deposition disease (CPPD) 04/22/2010  . KNEE PAIN, RIGHT 04/22/2010  . PATELLAR DISLOCATION, RIGHT 04/22/2010  . 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  ABNORMAL ELECTROCARDIOGRAM 01/17/2009    Past Medical History:  Diagnosis Date  . Arthritis    "knees, fingers", knees,  . Colitis   . Dysrhythmia    , WPW,SVT  . GERD (gastroesophageal reflux disease)   . Osteoporosis   . Segmental colitis (HCC)   . Ulcerative colitis    age 1  . WPW (Wolff-Parkinson-White syndrome)     Past Surgical History:  Procedure Laterality Date  . BREAST BIOPSY Right 02/22/1995  . CATARACT EXTRACTION W/ INTRAOCULAR LENS  IMPLANT, BILATERAL Bilateral 2-05/2011  . INGUINAL HERNIA REPAIR Right ~ 1981  . ORIF HUMERUS FRACTURE Left 01/11/2013   Procedure: LEFT ORIF PROXIMAL HUMERUS FRACTURE;  Surgeon: Senaida Lange, MD;  Location: [facility] OR;  Service: Orthopedics;  Laterality: Left;  SAME DAY LABS  . ORIF PROXIMAL HUMERUS FRACTURE Left 01/11/2013  . TONSILLECTOMY  1948  . TUBAL LIGATION  ~ 1985    Prior to Admission medications   Medication Sig Start Date End Date Taking? Authorizing Provider  calcium carbonate (TUMS EX) 750 MG chewable tablet Chew 1 tablet by mouth daily as needed for heartburn.   Yes [provider]  cholecalciferol (VITAMIN D3) 25 MCG (1000 UT) tablet Take 1,000 Units by mouth daily.   Yes [provider]  colchicine 0.6 MG tablet Take 0.6 mg by mouth daily as needed (gout).   Yes [provider]  famotidine-calcium carbonate-magnesium hydroxide (PEPCID COMPLETE) 10-800-165 MG chewable tablet Chew 1 tablet by mouth daily as needed (acid reflux).   Yes [provider]  mesalamine (LIALDA) 1.2 G EC tablet Take 2.4 g by mouth 2 (two) times daily. Two tablets in the morning and Two tablets at bedtime  Yes [provider]  Methylcellulose, Laxative, (CITRUCEL PO) Take 0.5 tablets by mouth daily as needed (regularity).   Yes [provider]  Multiple Vitamins-Minerals (PRESERVISION AREDS 2 PO) Take 1 capsule by mouth 2 (two) times daily. Preservision   Yes [provider]  propranolol  (INDERAL) 10 MG tablet Take 1-2 tablets at onset of SVT.  May take up to 40 mg (4 tabs) in one hour. Patient taking differently: Take by mouth See admin instructions. Take 1-2 tablets at onset of SVT.  May take up to 40 mg (4 tabs) in one hour. 05/17/19  Yes Marinus Mawaylor, Gregg W, MD  traMADol (ULTRAM) 50 MG tablet Take 1 tablet (50 mg total) by mouth every 12 (twelve) hours as needed. Patient taking differently: Take 50 mg by mouth every 12 (twelve) hours as needed for severe pain. 06/04/20  Yes Hudnall, Azucena FallenShane R, MD    Allergies  Allergen Reactions  . Other Rash  . Sulfonamide Derivatives Rash  . Sulfa Antibiotics Rash  . Nsaids Other (See Comments)    Contraindicated with lialda  . Penicillins Rash    Social History   Socioeconomic History  . Marital status: Married    Spouse name: Not on file  . Number of children: Not on file  . Years of education: Not on file  . Highest education level: Not on file  Occupational History  . Not on file  Tobacco Use  . Smoking status: Never Smoker  . Smokeless tobacco: Never Used  Vaping Use  . Vaping Use: Never used  Substance and Sexual Activity  . Alcohol use: Yes    Alcohol/week: 2.0 standard drinks    Types: 2 Glasses of wine per week    Comment: 01/11/2013 "2-3 glasses of wine/wk; some weeks not even that"  . Drug use: No  . Sexual activity: Yes  Other Topics Concern  . Not on file  Social History Narrative   RN for [employer]   Married         Social Determinants of Health   Financial Resource Strain: Low Risk   . Difficulty of Paying Living Expenses: Not hard at all  Food Insecurity: No Food Insecurity  . Worried About Programme researcher, broadcasting/film/videounning Out of Food in the Last Year: Never true  . Ran Out of Food in the Last Year: Never true  Transportation Needs: No Transportation Needs  . Lack of Transportation (Medical): No  . Lack of Transportation (Non-Medical): No  Physical Activity: Sufficiently Active  . Days of Exercise per Week: 7 days   . Minutes of Exercise per Session: 40 min  Stress: No Stress Concern Present  . Feeling of Stress : Not at all  Social Connections: Moderately Integrated  . Frequency of Communication with Friends and Family: More than three times a week  . Frequency of Social Gatherings with Friends and Family: Twice a week  . Attends Religious Services: More than 4 times per year  . Active Member of Clubs or Organizations: No  . Attends BankerClub or Organization Meetings: Never  . Marital Status: Married  Catering managerntimate Partner Violence: Not At Risk  . Fear of Current or Ex-Partner: No  . Emotionally Abused: No  . Physically Abused: No  . Sexually Abused: No    Tobacco Use: Low Risk   . Smoking Tobacco Use: Never Smoker  . Smokeless Tobacco Use: Never Used   Social History   Substance and Sexual Activity  Alcohol Use Yes  . Alcohol/week: 2.0 standard drinks  .  Types: 2 Glasses of wine per week   Comment: 01/11/2013 "2-3 glasses of wine/wk; some weeks not even that"    Family History  Problem Relation Age of Onset  . Osteoporosis Mother   . Prostate cancer Other     ROS: Constitutional: no fever, no chills, no night sweats, no significant weight loss Cardiovascular: no chest pain, no palpitations Respiratory: no cough, no shortness of breath, No COPD Gastrointestinal: no vomiting, no nausea Musculoskeletal: no swelling in Joints, Joint Pain Neurologic: no numbness, no tingling, no difficulty with balance   Objective:  Physical Exam: Well nourished and well developed.  General: Alert and oriented x3, cooperative and pleasant, no acute distress.  Head: normocephalic, atraumatic, neck supple.  Eyes: EOMI.  Respiratory: breath sounds clear in all fields, no wheezing, rales, or rhonchi. Cardiovascular: Regular rate and rhythm, no murmurs, gallops or rubs.  Abdomen: non-tender to palpation and soft, normoactive bowel sounds. Musculoskeletal:  Left Hip Exam:   ROM: is normal without  discomfort.     Left Knee Exam:   Moderate effusion.   Range of motion is 0-135 degrees.   Moderate to marked crepitus on range of motion of the knee.   Anterior tenderness.   Stable knee.     Sensation and motor function intact in LE. Distal pulses 2+. Calves soft and nontender.  Calves soft and nontender. Motor function intact in LE. Strength 5/5 LE bilaterally. Neuro: Distal pulses 2+. Sensation to light touch intact in LE.   Vital signs in last 24 hours:    Imaging Review AP and lateral x-rays of both knees dated 05/03/2020 demonstrate significant patellofemoral bone on bone changes worse on the right side Chondrocalcinosis present throughout the joint line bilaterall as well. Both medial and lateral joint lines demonstrate minimal joint space narrowing bilaterally, with the right knee slightly more advanced than the left.   Assessment/Plan:  End stage arthritis, left knee   The patient history, physical examination, clinical judgment of the provider and imaging studies are consistent with end stage degenerative joint disease of the left knee and total knee arthroplasty is deemed medically necessary. The treatment options including medical management, injection therapy arthroscopy and arthroplasty were discussed at length. The risks and benefits of total knee arthroplasty were presented and reviewed. The risks due to aseptic loosening, infection, stiffness, patella tracking problems, thromboembolic complications and other imponderables were discussed. The patient acknowledged the explanation, agreed to proceed with the plan and consent was signed. Patient is being admitted for inpatient treatment for surgery, pain control, PT, OT, prophylactic antibiotics, VTE prophylaxis, progressive ambulation and ADLs and discharge planning. The patient is planning to be discharged home.   Patient's anticipated LOS is less than 2 midnights, meeting these requirements: - Lives within 1 hour of  care - Has a competent adult at home to recover with post-op recover - NO history of  - Chronic pain requiring opiiods  - Diabetes  - Coronary Artery Disease  - Heart failure  - Heart attack  - Stroke  - DVT/VTE  - Cardiac arrhythmia  - Respiratory Failure/COPD  - Renal failure  - Anemia  - Advanced Liver disease      Therapy Plans: [facility] Disposition: Home with Husband Planned DVT Prophylaxis: Xarelto (is on Mesalamine daily for UC, does not want to come off of it) DME Needed: Borrowing walker, tentatively -- will let Noreene Larsson know.  PCP: Lewayne Bunting, MD (clearance received) TXA: IV Allergies: PCN (rash), Sulfa (rash) Anesthesia Concerns: None BMI: 21  Last HgbA1c: N/A  Pharmacy: Post-operative meds to be sent to [facility] - [address] [place]  Other: Has Wolf-Parkinson-White; Occasional SVT. Has never had Adenosine treatment.   - Patient was instructed on what medications to stop prior to surgery. - Follow-up visit in 2 weeks with Dr. Lequita Halt - Begin physical therapy following surgery - Pre-operative lab work as pre-surgical testing - Prescriptions will be provided in hospital at time of discharge  Nelia Shi, [facility], PA-C Orthopedic Surgery [facility] [place]

## 2020-07-23 ENCOUNTER — Observation Stay (HOSPITAL_COMMUNITY)
Admission: RE | Admit: 2020-07-23 | Discharge: 2020-07-24 | Disposition: A | Discharge status: Home / Self Care | Payer: Medicare PPO | Source: Ambulatory Visit | Attending: Orthopedic Surgery | Admitting: Orthopedic Surgery

## 2020-07-23 ENCOUNTER — Ambulatory Visit (HOSPITAL_COMMUNITY): Payer: Medicare PPO | Admitting: Anesthesiology

## 2020-07-23 ENCOUNTER — Other Ambulatory Visit: Payer: Self-pay

## 2020-07-23 ENCOUNTER — Encounter (HOSPITAL_COMMUNITY): Payer: Self-pay | Admitting: Orthopedic Surgery

## 2020-07-23 ENCOUNTER — Ambulatory Visit (HOSPITAL_COMMUNITY): Payer: Medicare PPO | Admitting: Physician Assistant

## 2020-07-23 ENCOUNTER — Encounter (HOSPITAL_COMMUNITY)
Admission: RE | Disposition: A | Discharge status: Home / Self Care | Payer: Self-pay | Source: Ambulatory Visit | Attending: Orthopedic Surgery

## 2020-07-23 DIAGNOSIS — G8918 Other acute postprocedural pain: Secondary | ICD-10-CM | POA: Diagnosis not present

## 2020-07-23 DIAGNOSIS — K5190 Ulcerative colitis, unspecified, without complications: Secondary | ICD-10-CM | POA: Diagnosis not present

## 2020-07-23 DIAGNOSIS — Z20822 Contact with and (suspected) exposure to covid-19: Secondary | ICD-10-CM | POA: Insufficient documentation

## 2020-07-23 DIAGNOSIS — M1712 Unilateral primary osteoarthritis, left knee: Principal | ICD-10-CM | POA: Diagnosis present

## 2020-07-23 DIAGNOSIS — M1710 Unilateral primary osteoarthritis, unspecified knee: Secondary | ICD-10-CM | POA: Diagnosis present

## 2020-07-23 DIAGNOSIS — K219 Gastro-esophageal reflux disease without esophagitis: Secondary | ICD-10-CM | POA: Diagnosis not present

## 2020-07-23 DIAGNOSIS — M179 Osteoarthritis of knee, unspecified: Secondary | ICD-10-CM | POA: Diagnosis present

## 2020-07-23 HISTORY — PX: TOTAL KNEE ARTHROPLASTY: SHX125

## 2020-07-23 LAB — TYPE AND SCREEN
ABO/RH(D): O POS
Antibody Screen: NEGATIVE

## 2020-07-23 LAB — ABO/RH: ABO/RH(D): O POS

## 2020-07-23 LAB — SARS CORONAVIRUS 2 BY RT PCR (HOSPITAL ORDER, PERFORMED IN ~~LOC~~ HOSPITAL LAB): SARS Coronavirus 2: NEGATIVE

## 2020-07-23 SURGERY — ARTHROPLASTY, KNEE, TOTAL
Anesthesia: Spinal | Site: Knee | Laterality: Left

## 2020-07-23 MED ORDER — PHENYLEPHRINE HCL (PRESSORS) 10 MG/ML IV SOLN
INTRAVENOUS | Status: AC
  Filled 2020-07-23: qty 1

## 2020-07-23 MED ORDER — OXYCODONE HCL 5 MG PO TABS: 5.0000 mg | ORAL_TABLET | Freq: Once | ORAL | Status: DC | PRN

## 2020-07-23 MED ORDER — DEXAMETHASONE SODIUM PHOSPHATE 10 MG/ML IJ SOLN
10.0000 mg | Freq: Once | INTRAMUSCULAR | Status: AC
  Administered 2020-07-24: 10 mg via INTRAVENOUS
  Filled 2020-07-23: qty 1

## 2020-07-23 MED ORDER — SODIUM CHLORIDE 0.9 % IR SOLN
Status: DC | PRN
  Administered 2020-07-23: 1000 mL

## 2020-07-23 MED ORDER — GLYCOPYRROLATE PF 0.2 MG/ML IJ SOSY
PREFILLED_SYRINGE | INTRAMUSCULAR | Status: AC
  Filled 2020-07-23: qty 1

## 2020-07-23 MED ORDER — STERILE WATER FOR IRRIGATION IR SOLN
Status: DC | PRN
  Administered 2020-07-23: 2000 mL

## 2020-07-23 MED ORDER — SODIUM CHLORIDE (PF) 0.9 % IJ SOLN
INTRAMUSCULAR | Status: DC | PRN
  Administered 2020-07-23: 60 mL

## 2020-07-23 MED ORDER — MIDAZOLAM HCL 2 MG/2ML IJ SOLN
1.0000 mg | INTRAMUSCULAR | Status: DC
  Filled 2020-07-23: qty 2

## 2020-07-23 MED ORDER — PROPOFOL 10 MG/ML IV BOLUS
INTRAVENOUS | Status: DC | PRN
  Administered 2020-07-23: 40 mg via INTRAVENOUS

## 2020-07-23 MED ORDER — TRANEXAMIC ACID-NACL 1000-0.7 MG/100ML-% IV SOLN
1000.0000 mg | INTRAVENOUS | Status: AC
  Administered 2020-07-23: 1000 mg via INTRAVENOUS
  Filled 2020-07-23: qty 100

## 2020-07-23 MED ORDER — 0.9 % SODIUM CHLORIDE (POUR BTL) OPTIME
TOPICAL | Status: DC | PRN
  Administered 2020-07-23: 1000 mL

## 2020-07-23 MED ORDER — BUPIVACAINE IN DEXTROSE 0.75-8.25 % IT SOLN
INTRATHECAL | Status: DC | PRN
  Administered 2020-07-23: 1.4 mL via INTRATHECAL

## 2020-07-23 MED ORDER — MENTHOL 3 MG MT LOZG: 1.0000 | LOZENGE | OROMUCOSAL | Status: DC | PRN

## 2020-07-23 MED ORDER — ONDANSETRON HCL 4 MG/2ML IJ SOLN: 4.0000 mg | Freq: Once | INTRAMUSCULAR | Status: DC | PRN

## 2020-07-23 MED ORDER — BUPIVACAINE HCL (PF) 0.5 % IJ SOLN
INTRAMUSCULAR | Status: DC | PRN
  Administered 2020-07-23: 20 mL via PERINEURAL

## 2020-07-23 MED ORDER — ONDANSETRON HCL 4 MG/2ML IJ SOLN
INTRAMUSCULAR | Status: AC
  Filled 2020-07-23: qty 2

## 2020-07-23 MED ORDER — POVIDONE-IODINE 10 % EX SWAB
2.0000 "application " | Freq: Once | CUTANEOUS | Status: AC
  Administered 2020-07-23: 2 via TOPICAL

## 2020-07-23 MED ORDER — PHENOL 1.4 % MT LIQD: 1.0000 | OROMUCOSAL | Status: DC | PRN

## 2020-07-23 MED ORDER — DEXAMETHASONE SODIUM PHOSPHATE 10 MG/ML IJ SOLN
8.0000 mg | Freq: Once | INTRAMUSCULAR | Status: AC
Start: 2020-07-23 — End: 2020-07-23
  Administered 2020-07-23: 8 mg via INTRAVENOUS

## 2020-07-23 MED ORDER — DEXAMETHASONE SODIUM PHOSPHATE 10 MG/ML IJ SOLN
INTRAMUSCULAR | Status: AC
  Filled 2020-07-23: qty 1

## 2020-07-23 MED ORDER — FENTANYL CITRATE (PF) 100 MCG/2ML IJ SOLN
INTRAMUSCULAR | Status: AC
  Filled 2020-07-23: qty 2

## 2020-07-23 MED ORDER — SODIUM CHLORIDE (PF) 0.9 % IJ SOLN
INTRAMUSCULAR | Status: AC
  Filled 2020-07-23: qty 10

## 2020-07-23 MED ORDER — CHLORHEXIDINE GLUCONATE 0.12 % MT SOLN: 15.0000 mL | Freq: Once | OROMUCOSAL | Status: AC

## 2020-07-23 MED ORDER — OXYCODONE HCL 5 MG PO TABS
5.0000 mg | ORAL_TABLET | ORAL | Status: DC | PRN
  Administered 2020-07-23: 5 mg via ORAL
  Administered 2020-07-23 – 2020-07-24 (×3): 10 mg via ORAL
  Filled 2020-07-23: qty 1
  Filled 2020-07-23 (×2): qty 2
  Filled 2020-07-23: qty 1
  Filled 2020-07-23: qty 2

## 2020-07-23 MED ORDER — FLEET ENEMA 7-19 GM/118ML RE ENEM: 1.0000 | ENEMA | Freq: Once | RECTAL | Status: DC | PRN

## 2020-07-23 MED ORDER — RIVAROXABAN 10 MG PO TABS
10.0000 mg | ORAL_TABLET | Freq: Every day | ORAL | Status: DC
  Administered 2020-07-24: 10 mg via ORAL
  Filled 2020-07-23: qty 1

## 2020-07-23 MED ORDER — ACETAMINOPHEN 10 MG/ML IV SOLN
1000.0000 mg | Freq: Four times a day (QID) | INTRAVENOUS | Status: DC
  Administered 2020-07-23: 1000 mg via INTRAVENOUS
  Filled 2020-07-23: qty 100

## 2020-07-23 MED ORDER — OXYCODONE HCL 5 MG/5ML PO SOLN: 5.0000 mg | Freq: Once | ORAL | Status: DC | PRN

## 2020-07-23 MED ORDER — MESALAMINE 1.2 G PO TBEC
2.4000 g | DELAYED_RELEASE_TABLET | Freq: Two times a day (BID) | ORAL | Status: DC
  Administered 2020-07-23 – 2020-07-24 (×2): 2.4 g via ORAL
  Filled 2020-07-23 (×2): qty 2

## 2020-07-23 MED ORDER — CEFAZOLIN SODIUM-DEXTROSE 2-4 GM/100ML-% IV SOLN
2.0000 g | INTRAVENOUS | Status: AC
  Administered 2020-07-23: 2 g via INTRAVENOUS
  Filled 2020-07-23: qty 100

## 2020-07-23 MED ORDER — TRAMADOL HCL 50 MG PO TABS
50.0000 mg | ORAL_TABLET | Freq: Four times a day (QID) | ORAL | Status: DC | PRN
  Administered 2020-07-24: 100 mg via ORAL
  Filled 2020-07-23: qty 2

## 2020-07-23 MED ORDER — DIPHENHYDRAMINE HCL 12.5 MG/5ML PO ELIX: 12.5000 mg | ORAL_SOLUTION | ORAL | Status: DC | PRN

## 2020-07-23 MED ORDER — ORAL CARE MOUTH RINSE
15.0000 mL | Freq: Once | OROMUCOSAL | Status: AC
  Administered 2020-07-23: 15 mL via OROMUCOSAL

## 2020-07-23 MED ORDER — FENTANYL CITRATE (PF) 100 MCG/2ML IJ SOLN
50.0000 ug | INTRAMUSCULAR | Status: DC
  Administered 2020-07-23: 50 ug via INTRAVENOUS
  Filled 2020-07-23: qty 2

## 2020-07-23 MED ORDER — ONDANSETRON HCL 4 MG/2ML IJ SOLN
INTRAMUSCULAR | Status: DC | PRN
  Administered 2020-07-23: 4 mg via INTRAVENOUS

## 2020-07-23 MED ORDER — DOCUSATE SODIUM 100 MG PO CAPS
100.0000 mg | ORAL_CAPSULE | Freq: Two times a day (BID) | ORAL | Status: DC
  Administered 2020-07-23 – 2020-07-24 (×2): 100 mg via ORAL
  Filled 2020-07-23 (×2): qty 1

## 2020-07-23 MED ORDER — ONDANSETRON HCL 4 MG/2ML IJ SOLN
4.0000 mg | Freq: Four times a day (QID) | INTRAMUSCULAR | Status: DC | PRN
  Filled 2020-07-23: qty 2

## 2020-07-23 MED ORDER — POLYETHYLENE GLYCOL 3350 17 G PO PACK: 17.0000 g | PACK | Freq: Every day | ORAL | Status: DC | PRN

## 2020-07-23 MED ORDER — LACTATED RINGERS IV SOLN: INTRAVENOUS | Status: DC

## 2020-07-23 MED ORDER — METOCLOPRAMIDE HCL 5 MG/ML IJ SOLN
5.0000 mg | Freq: Three times a day (TID) | INTRAMUSCULAR | Status: DC | PRN
Start: 2020-07-23 — End: 2020-07-24

## 2020-07-23 MED ORDER — PHENYLEPHRINE HCL-NACL 10-0.9 MG/250ML-% IV SOLN
INTRAVENOUS | Status: DC | PRN
  Administered 2020-07-23: 40 ug/min via INTRAVENOUS

## 2020-07-23 MED ORDER — SODIUM CHLORIDE 0.9 % IV SOLN: INTRAVENOUS | Status: DC

## 2020-07-23 MED ORDER — ONDANSETRON HCL 4 MG PO TABS: 4.0000 mg | ORAL_TABLET | Freq: Four times a day (QID) | ORAL | Status: DC | PRN

## 2020-07-23 MED ORDER — BUPIVACAINE LIPOSOME 1.3 % IJ SUSP
INTRAMUSCULAR | Status: DC | PRN
  Administered 2020-07-23: 20 mL

## 2020-07-23 MED ORDER — PROPOFOL 500 MG/50ML IV EMUL
INTRAVENOUS | Status: DC | PRN
  Administered 2020-07-23: 120 ug/kg/min via INTRAVENOUS

## 2020-07-23 MED ORDER — GLYCOPYRROLATE PF 0.2 MG/ML IJ SOSY
PREFILLED_SYRINGE | INTRAMUSCULAR | Status: DC | PRN
  Administered 2020-07-23: .2 mg via INTRAVENOUS

## 2020-07-23 MED ORDER — BISACODYL 10 MG RE SUPP: 10.0000 mg | Freq: Every day | RECTAL | Status: DC | PRN

## 2020-07-23 MED ORDER — CEFAZOLIN SODIUM-DEXTROSE 2-4 GM/100ML-% IV SOLN
2.0000 g | Freq: Four times a day (QID) | INTRAVENOUS | Status: AC
  Administered 2020-07-23 – 2020-07-24 (×2): 2 g via INTRAVENOUS
  Filled 2020-07-23 (×2): qty 100

## 2020-07-23 MED ORDER — HYDROMORPHONE HCL 1 MG/ML IJ SOLN: 0.2500 mg | INTRAMUSCULAR | Status: DC | PRN

## 2020-07-23 MED ORDER — METOCLOPRAMIDE HCL 5 MG PO TABS: 5.0000 mg | ORAL_TABLET | Freq: Three times a day (TID) | ORAL | Status: DC | PRN

## 2020-07-23 MED ORDER — MORPHINE SULFATE (PF) 2 MG/ML IV SOLN
0.5000 mg | INTRAVENOUS | Status: DC | PRN
  Administered 2020-07-23 – 2020-07-24 (×2): 1 mg via INTRAVENOUS
  Filled 2020-07-23 (×2): qty 1

## 2020-07-23 MED ORDER — ACETAMINOPHEN 500 MG PO TABS
1000.0000 mg | ORAL_TABLET | Freq: Four times a day (QID) | ORAL | Status: AC
  Administered 2020-07-23: 500 mg via ORAL
  Administered 2020-07-23 – 2020-07-24 (×3): 1000 mg via ORAL
  Filled 2020-07-23 (×4): qty 2

## 2020-07-23 MED ORDER — PROPRANOLOL HCL 10 MG PO TABS
10.0000 mg | ORAL_TABLET | Freq: Every day | ORAL | Status: DC | PRN
  Filled 2020-07-23: qty 4

## 2020-07-23 SURGICAL SUPPLY — 55 items
ATTUNE PS FEM LT SZ 3 CEM KNEE (Femur) ×1 IMPLANT
ATTUNE PSRP INSR SZ3 8 KNEE (Insert) ×1 IMPLANT
BAG SPEC THK2 15X12 ZIP CLS (MISCELLANEOUS) ×1
BAG ZIPLOCK 12X15 (MISCELLANEOUS) ×2 IMPLANT
BASE TIBIAL ROT PLAT SZ 3 KNEE (Knees) IMPLANT
BLADE SAG 18X100X1.27 (BLADE) ×2 IMPLANT
BLADE SAW SGTL 11.0X1.19X90.0M (BLADE) ×2 IMPLANT
BNDG ELASTIC 6X5.8 VLCR STR LF (GAUZE/BANDAGES/DRESSINGS) ×2 IMPLANT
BOWL SMART MIX CTS (DISPOSABLE) ×2 IMPLANT
BSPLAT TIB 3 CMNT ROT PLAT STR (Knees) ×1 IMPLANT
CEMENT HV SMART SET (Cement) ×4 IMPLANT
CLSR STERI-STRIP ANTIMIC 1/2X4 (GAUZE/BANDAGES/DRESSINGS) ×1 IMPLANT
COVER SURGICAL LIGHT HANDLE (MISCELLANEOUS) ×2 IMPLANT
COVER WAND RF STERILE (DRAPES) IMPLANT
CUFF TOURN SGL QUICK 34 (TOURNIQUET CUFF) ×2
CUFF TRNQT CYL 34X4.125X (TOURNIQUET CUFF) ×1 IMPLANT
DECANTER SPIKE VIAL GLASS SM (MISCELLANEOUS) ×2 IMPLANT
DRAPE U-SHAPE 47X51 STRL (DRAPES) ×2 IMPLANT
DRSG AQUACEL AG ADV 3.5X10 (GAUZE/BANDAGES/DRESSINGS) ×2 IMPLANT
DURAPREP 26ML APPLICATOR (WOUND CARE) ×2 IMPLANT
ELECT REM PT RETURN 15FT ADLT (MISCELLANEOUS) ×2 IMPLANT
GLOVE SRG 8 PF TXTR STRL LF DI (GLOVE) ×1 IMPLANT
GLOVE SURG ENC MOIS LTX SZ6.5 (GLOVE) ×2 IMPLANT
GLOVE SURG ENC MOIS LTX SZ8 (GLOVE) ×4 IMPLANT
GLOVE SURG UNDER POLY LF SZ7 (GLOVE) ×2 IMPLANT
GLOVE SURG UNDER POLY LF SZ8 (GLOVE) ×2
GLOVE SURG UNDER POLY LF SZ8.5 (GLOVE) ×2 IMPLANT
GOWN STRL REUS W/TWL LRG LVL3 (GOWN DISPOSABLE) ×4 IMPLANT
GOWN STRL REUS W/TWL XL LVL3 (GOWN DISPOSABLE) ×2 IMPLANT
HANDPIECE INTERPULSE COAX TIP (DISPOSABLE) ×2
HOLDER FOLEY CATH W/STRAP (MISCELLANEOUS) IMPLANT
IMMOBILIZER KNEE 20 (SOFTGOODS) ×2
IMMOBILIZER KNEE 20 THIGH 36 (SOFTGOODS) ×1 IMPLANT
KIT TURNOVER KIT A (KITS) ×2 IMPLANT
MANIFOLD NEPTUNE II (INSTRUMENTS) ×2 IMPLANT
NS IRRIG 1000ML POUR BTL (IV SOLUTION) ×2 IMPLANT
PACK TOTAL KNEE CUSTOM (KITS) ×2 IMPLANT
PADDING CAST COTTON 6X4 STRL (CAST SUPPLIES) ×3 IMPLANT
PATELLA MEDIAL ATTUN 35MM KNEE (Knees) ×1 IMPLANT
PENCIL SMOKE EVACUATOR (MISCELLANEOUS) ×2 IMPLANT
PIN DRILL FIX HALF THREAD (BIT) ×1 IMPLANT
PIN STEINMAN FIXATION KNEE (PIN) ×1 IMPLANT
PROTECTOR NERVE ULNAR (MISCELLANEOUS) ×2 IMPLANT
SET HNDPC FAN SPRY TIP SCT (DISPOSABLE) ×1 IMPLANT
STRIP CLOSURE SKIN 1/2X4 (GAUZE/BANDAGES/DRESSINGS) ×4 IMPLANT
SUT MNCRL AB 4-0 PS2 18 (SUTURE) ×2 IMPLANT
SUT STRATAFIX 0 PDS 27 VIOLET (SUTURE) ×2
SUT VIC AB 2-0 CT1 27 (SUTURE) ×6
SUT VIC AB 2-0 CT1 TAPERPNT 27 (SUTURE) ×3 IMPLANT
SUTURE STRATFX 0 PDS 27 VIOLET (SUTURE) ×1 IMPLANT
TIBIAL BASE ROT PLAT SZ 3 KNEE (Knees) ×2 IMPLANT
TRAY FOLEY MTR SLVR 16FR STAT (SET/KITS/TRAYS/PACK) ×2 IMPLANT
TUBE SUCTION HIGH CAP CLEAR NV (SUCTIONS) ×2 IMPLANT
WATER STERILE IRR 1000ML POUR (IV SOLUTION) ×4 IMPLANT
WRAP KNEE MAXI GEL POST OP (GAUZE/BANDAGES/DRESSINGS) ×2 IMPLANT

## 2020-07-23 NOTE — Anesthesia Procedure Notes (Signed)
Anesthesia Regional Block: Adductor canal block   Pre-Anesthetic Checklist: ,, timeout performed, Correct Patient, Correct Site, Correct Laterality, Correct Procedure, Correct Position, site marked, Risks and benefits discussed,  Surgical consent,  Pre-op evaluation,  At surgeon's request and post-op pain management  Laterality: Left  Prep: chloraprep       Needles:  Injection technique: Single-shot  Needle Type: Echogenic Needle     Needle Length: 9cm      Additional Needles:   Procedures:,,,, ultrasound used (permanent image in chart),,,,  Narrative:  Start time: 07/23/2020 11:56 AM End time: 07/23/2020 12:02 PM Injection made incrementally with aspirations every 5 mL.  Performed by: Personally  Anesthesiologist: Eilene Ghazi, MD  Additional Notes: Patient tolerated the procedure well without complications

## 2020-07-23 NOTE — Care Plan (Signed)
Ortho Bundle Case Management Note  Patient Details  Name: Caitlin Hicks MRN: [id number] Date of Birth: October 12, 1945  L TKA on 07-23-20 DCP:  Home with husband.  2 story home with 3 ste. DME:  Borrowing a RW and doesn't want a 3-in-1 PT:  [facility].  PT eval scheduled on 07-26-20                   DME Arranged:  N/A DME Agency:  NA  HH Arranged:  NA HH Agency:  NA  Additional Comments: Please contact me with any questions of if this plan should need to change.  Ennis Forts, RN,CCM [facility]  [phone] 07/23/2020, 10:20 AM

## 2020-07-23 NOTE — Progress Notes (Signed)
Assisted Dr.GeorgeRose with Left Knee Adductor canal  block. Side rails up, monitors on throughout procedure. See vital signs in flow sheet. Tolerated Procedure well.  

## 2020-07-23 NOTE — Progress Notes (Signed)
Orthopedic Tech Progress Note Patient Details:  Caitlin Hicks 11/03/1945 [id number] Applied CPM to LLE 10 to 40 degrees. CPM Left Knee CPM Left Knee: On Left Knee Flexion (Degrees): 40 Left Knee Extension (Degrees): 10  Post Interventions Patient Tolerated: Well Instructions Provided: Adjustment of device  Kerry Fort 07/23/2020, 6:22 PM

## 2020-07-23 NOTE — Anesthesia Procedure Notes (Signed)
Spinal  Patient location during procedure: OR Reason for block: surgical anesthesia Staffing Resident/CRNA: Cleda Daub, CRNA Preanesthetic Checklist Completed: patient identified, IV checked, site marked, risks and benefits discussed, surgical consent, monitors and equipment checked, pre-op evaluation and timeout performed Spinal Block Patient position: sitting Prep: DuraPrep Patient monitoring: heart rate, cardiac monitor, continuous pulse ox and blood pressure Approach: midline Location: L3-4 Injection technique: single-shot Needle Needle type: Pencan  Needle gauge: 24 G Needle length: 10 cm Assessment Sensory level: T4 Events: CSF return Additional Notes Spinal kit expiration confirmed; aseptic technique; 2% lido local infiltration before introducer; clear free flow of CSF; swirl seen prior to spinal administration.

## 2020-07-23 NOTE — Interval H&P Note (Signed)
History and Physical Interval Note:  07/23/2020 9:42 AM  Caitlin Hicks  has presented today for surgery, with the diagnosis of left knee osteoarthritis.  The various methods of treatment have been discussed with the patient and family. After consideration of risks, benefits and other options for treatment, the patient has consented to  Procedure(s) with comments: TOTAL KNEE ARTHROPLASTY (Left) - as a surgical intervention.  The patient's history has been reviewed, patient examined, no change in status, stable for surgery.  I have reviewed the patient's chart and labs.  Questions were answered to the patient's satisfaction.     Caitlin Hicks

## 2020-07-23 NOTE — Transfer of Care (Signed)
Immediate Anesthesia Transfer of Care Note  Patient: CRISSA SOWDER  Procedure(s) Performed: TOTAL KNEE ARTHROPLASTY (Left Knee)  Patient Location: PACU  Anesthesia Type:MAC combined with regional for post-op pain  Level of Consciousness: awake, alert , oriented and patient cooperative  Airway & Oxygen Therapy: Patient Spontanous Breathing and Patient connected to nasal cannula oxygen  Post-op Assessment: Report given to RN and Post -op Vital signs reviewed and stable  Post vital signs: Reviewed and stable  Last Vitals:  Vitals Value Taken Time  BP    Temp    Pulse 67 07/23/20 1428  Resp 13 07/23/20 1428  SpO2 100 % 07/23/20 1428  Vitals shown include unvalidated device data.  Last Pain:  Vitals:   07/23/20 0935  TempSrc: Oral         Complications: No complications documented.

## 2020-07-23 NOTE — Anesthesia Postprocedure Evaluation (Signed)
Anesthesia Post Note  Patient: Caitlin Hicks  Procedure(s) Performed: TOTAL KNEE ARTHROPLASTY (Left Knee)     Patient location during evaluation: PACU Anesthesia Type: Spinal Level of consciousness: oriented and awake and alert Pain management: pain level controlled Vital Signs Assessment: post-procedure vital signs reviewed and stable Respiratory status: spontaneous breathing, respiratory function stable and patient connected to nasal cannula oxygen Cardiovascular status: blood pressure returned to baseline and stable Postop Assessment: no headache, no backache and no apparent nausea or vomiting Anesthetic complications: no   No complications documented.  Last Vitals:  Vitals:   07/23/20 1500 07/23/20 1515  BP: (!) 99/44 (!) 104/53  Pulse: 60 (!) 54  Resp: 19 15  Temp:    SpO2: 99% 100%    Last Pain:  Vitals:   07/23/20 1515  TempSrc:   PainSc: 0-No pain                 Capone Schwinn S

## 2020-07-23 NOTE — Evaluation (Signed)
Physical Therapy Evaluation Patient Details Name: Caitlin Hicks MRN: [id number] DOB: [birth year] Today's Date: 07/23/2020   History of Present Illness  Patient is 75 y.o. female s/p Lt TKA on 07/23/20 with PMH significant for WPWS, Osteoporosis, GERD, OA.  Clinical Impression  Caitlin Hicks is a 75 y.o. female POD 0 s/p Lt TKA. Patient reports independence with mobility at baseline. Patient is now limited by functional impairments (see PT problem list below) and requires min assist for transfers and gait with RW. Patient was able to ambulate ~80 feet with RW and min assist. Patient instructed in exercise to facilitate circulation to manage edema and reduce risk of DVT. Patient will benefit from continued skilled PT interventions to address impairments and progress towards PLOF. Acute PT will follow to progress mobility and stair training in preparation for safe discharge home.     Follow Up Recommendations Follow surgeon's recommendation for DC plan and follow-up therapies;Outpatient PT    Equipment Recommendations  Rolling walker with 5" wheels (youth RW)    Recommendations for Other Services       Precautions / Restrictions Precautions Precautions: Fall Restrictions Weight Bearing Restrictions: No Other Position/Activity Restrictions: WBAT      Mobility  Bed Mobility Overal bed mobility: Needs Assistance Bed Mobility: Supine to Sit     Supine to sit: HOB elevated;Min guard     General bed mobility comments: pt sat up to long sitting and able to use UE's to help Lt LE to EOB, guarding/supervision for safety    Transfers Overall transfer level: Needs assistance Equipment used: Rolling walker (2 wheeled) Transfers: Sit to/from Stand Sit to Stand: Min assist         General transfer comment: Cue for hand placement. assist for power up and to steady initially.  Ambulation/Gait Ambulation/Gait assistance: Min assist Gait Distance (Feet): 80 Feet Assistive device: Rolling  walker (2 wheeled) Gait Pattern/deviations: Step-to pattern;Decreased stride length;Decreased weight shift to left Gait velocity: decr   General Gait Details: cues for step to pattern and proximity to RW, cues for safety at start. pt with slightly NBOS but no overt LOB or buckling at Lt knee.  Stairs            Wheelchair Mobility    Modified Rankin (Stroke Patients Only)       Balance Overall balance assessment: Needs assistance Sitting-balance support: Feet supported Sitting balance-Leahy Scale: Good     Standing balance support: During functional activity;Bilateral upper extremity supported Standing balance-Leahy Scale: Poor                               Pertinent Vitals/Pain Pain Assessment: 0-10 Pain Score: 3  Pain Location: Lt TKA Pain Descriptors / Indicators: Aching;Discomfort Pain Intervention(s): Limited activity within patient's tolerance;Monitored during session;Repositioned    Home Living Family/patient expects to be discharged to:: Private residence Living Arrangements: Spouse/significant other Available Help at Discharge: Family Type of Home: House Home Access: Stairs to enter Entrance Stairs-Rails: None Entrance Stairs-Number of Steps: 3 Home Layout: Two level;Full bath on main level;Able to live on main level with bedroom/bathroom Home Equipment: Bedside commode;Toilet riser;Cane - single point      Prior Function Level of Independence: Independent               Hand Dominance   Dominant Hand: Right    Extremity/Trunk Assessment   Upper Extremity Assessment Upper Extremity Assessment: Overall WFL for tasks assessed  Lower Extremity Assessment Lower Extremity Assessment: LLE deficits/detail LLE Deficits / Details: good quad activation, no extensor lag with SLR LLE Sensation: WNL LLE Coordination: WNL    Cervical / Trunk Assessment Cervical / Trunk Assessment: Normal  Communication   Communication: No  difficulties  Cognition Arousal/Alertness: Awake/alert Behavior During Therapy: WFL for tasks assessed/performed Overall Cognitive Status: Within Functional Limits for tasks assessed                                        General Comments      Exercises Total Joint Exercises Ankle Circles/Pumps: AROM;Both;20 reps   Assessment/Plan    PT Assessment Patient needs continued PT services  PT Problem List Decreased strength;Decreased range of motion;Decreased activity tolerance;Decreased balance;Decreased mobility;Decreased knowledge of use of DME;Decreased knowledge of precautions       PT Treatment Interventions DME instruction;Gait training;Stair training;Functional mobility training;Therapeutic activities;Therapeutic exercise;Balance training;Patient/family education    PT Goals (Current goals can be found in the Care Plan section)  Acute Rehab PT Goals Patient Stated Goal: regain independence and get back to exercising PT Goal Formulation: With patient Time For Goal Achievement: 07/30/20 Potential to Achieve Goals: Good    Frequency 7X/week   Barriers to discharge        Co-evaluation               AM-PAC PT "6 Clicks" Mobility  Outcome Measure Help needed turning from your back to your side while in a flat bed without using bedrails?: None Help needed moving from lying on your back to sitting on the side of a flat bed without using bedrails?: A Little Help needed moving to and from a bed to a chair (including a wheelchair)?: A Little Help needed standing up from a chair using your arms (e.g., wheelchair or bedside chair)?: A Little Help needed to walk in hospital room?: A Little Help needed climbing 3-5 steps with a railing? : A Little 6 Click Score: 19    End of Session Equipment Utilized During Treatment: Gait belt Activity Tolerance: Patient tolerated treatment well Patient left: in chair;with call bell/phone within reach;with chair alarm  set;with family/visitor present Nurse Communication: Mobility status PT Visit Diagnosis: Muscle weakness (generalized) (M62.81);Difficulty in walking, not elsewhere classified (R26.2)    Time: 0017-4944 PT Time Calculation (min) (ACUTE ONLY): 36 min   Charges:   PT Evaluation $PT Eval Low Complexity: 1 Low PT Treatments $Gait Training: 8-22 mins        Wynn Maudlin, DPT Acute Rehabilitation Services Office [phone] Pager [phone]    Anitra Lauth 07/23/2020, 6:43 PM

## 2020-07-23 NOTE — Anesthesia Procedure Notes (Signed)
Anesthesia Procedure Image    

## 2020-07-23 NOTE — Discharge Instructions (Addendum)
Ollen GrossFrank Aluisio, MD Total Joint Specialist [facility] [address] [place], [place] [address] [phone]  TOTAL KNEE REPLACEMENT POSTOPERATIVE DIRECTIONS    Knee Rehabilitation, Guidelines Following Surgery  Results after knee surgery are often greatly improved when you follow the exercise, range of motion and muscle strengthening exercises prescribed by your doctor. Safety measures are also important to protect the knee from further injury. If any of these exercises cause you to have increased pain or swelling in your knee joint, decrease the amount until you are comfortable again and slowly increase them. If you have problems or questions, call your caregiver or physical therapist for advice.   BLOOD CLOT PREVENTION . Take a 10 mg Xarelto once a day for three weeks following surgery.  . You may resume your vitamins/supplements once you have discontinued the Xarelto.  HOME CARE INSTRUCTIONS  . Remove items at home which could result in a fall. This includes throw rugs or furniture in walking pathways.  . ICE to the affected knee as much as tolerated. Icing helps control swelling. If the swelling is well controlled you will be more comfortable and rehab easier. Continue to use ice on the knee for pain and swelling from surgery. You may notice swelling that will progress down to the foot and ankle. This is normal after surgery. Elevate the leg when you are not up walking on it.    . Continue to use the breathing machine which will help keep your temperature down. It is common for your temperature to cycle up and down following surgery, especially at night when you are not up moving around and exerting yourself. The breathing machine keeps your lungs expanded and your temperature down. . Do not place pillow under the operative knee, focus on keeping the knee straight while resting  DIET You may resume your previous home diet once you are discharged from the  hospital.  DRESSING / WOUND CARE / SHOWERING . Keep your bulky bandage on for 2 days. On the third post-operative day you may remove the Ace bandage and gauze. There is a waterproof adhesive bandage on your skin which will stay in place until your first follow-up appointment. Once you remove this you will not need to place another bandage . You may begin showering 3 days following surgery, but do not submerge the incision under water.  ACTIVITY For the first 5 days, the key is rest and control of pain and swelling . Do your home exercises twice a day starting on post-operative day 3. On the days you go to physical therapy, just do the home exercises once that day. . You should rest, ice and elevate the leg for 50 minutes out of every hour. Get up and walk/stretch for 10 minutes per hour. After 5 days you can increase your activity slowly as tolerated. . Walk with your walker as instructed. Use the walker until you are comfortable transitioning to a cane. Walk with the cane in the opposite hand of the operative leg. You may discontinue the cane once you are comfortable and walking steadily. . Avoid periods of inactivity such as sitting longer than an hour when not asleep. This helps prevent blood clots.  . You may discontinue the knee immobilizer once you are able to perform a straight leg raise while lying down. . You may resume a sexual relationship in one month or when given the OK by your doctor.  . You may return to work once you are  cleared by your doctor.  . Do not drive a car for 6 weeks or until released by your surgeon.  . Do not drive while taking narcotics.  TED HOSE STOCKINGS Wear the elastic stockings on both legs for three weeks following surgery during the day. You may remove them at night for sleeping.  WEIGHT BEARING Weight bearing as tolerated with assist device (walker, cane, etc) as directed, use it as long as suggested by your surgeon or therapist, typically at least 4-6  weeks.  POSTOPERATIVE CONSTIPATION PROTOCOL Constipation - defined medically as fewer than three stools per week and severe constipation as less than one stool per week.  One of the most common issues patients have following surgery is constipation.  Even if you have a regular bowel pattern at home, your normal regimen is likely to be disrupted due to multiple reasons following surgery.  Combination of anesthesia, postoperative narcotics, change in appetite and fluid intake all can affect your bowels.  In order to avoid complications following surgery, here are some recommendations in order to help you during your recovery period.  . Colace (docusate) - Pick up an over-the-counter form of Colace or another stool softener and take twice a day as long as you are requiring postoperative pain medications.  Take with a full glass of water daily.  If you experience loose stools or diarrhea, hold the colace until you stool forms back up. If your symptoms do not get better within 1 week or if they get worse, check with your doctor. . Dulcolax (bisacodyl) - Pick up over-the-counter and take as directed by the product packaging as needed to assist with the movement of your bowels.  Take with a full glass of water.  Use this product as needed if not relieved by Colace only.  . MiraLax (polyethylene glycol) - Pick up over-the-counter to have on hand. MiraLax is a solution that will increase the amount of water in your bowels to assist with bowel movements.  Take as directed and can mix with a glass of water, juice, soda, coffee, or tea. Take if you go more than two days without a movement. Do not use MiraLax more than once per day. Call your doctor if you are still constipated or irregular after using this medication for 7 days in a row.  If you continue to have problems with postoperative constipation, please contact the office for further assistance and recommendations.  If you experience "the worst abdominal pain  ever" or develop nausea or vomiting, please contact the office immediatly for further recommendations for treatment.  ITCHING If you experience itching with your medications, try taking only a single pain pill, or even half a pain pill at a time.  You can also use Benadryl over the counter for itching or also to help with sleep.   MEDICATIONS See your medication summary on the "After Visit Summary" that the nursing staff will review with you prior to discharge.  You may have some home medications which will be placed on hold until you complete the course of blood thinner medication.  It is important for you to complete the blood thinner medication as prescribed by your surgeon.  Continue your approved medications as instructed at time of discharge.  PRECAUTIONS . If you experience chest pain or shortness of breath - call 911 immediately for transfer to the hospital emergency department.  . If you develop a fever greater that 101 F, purulent drainage from wound, increased redness or drainage from wound,  foul odor from the wound/dressing, or calf pain - CONTACT YOUR SURGEON.                                                   FOLLOW-UP APPOINTMENTS Make sure you keep all of your appointments after your operation with your surgeon and caregivers. You should call the office at the above phone number and make an appointment for approximately two weeks after the date of your surgery or on the date instructed by your surgeon outlined in the "After Visit Summary".  RANGE OF MOTION AND STRENGTHENING EXERCISES  Rehabilitation of the knee is important following a knee injury or an operation. After just a few days of immobilization, the muscles of the thigh which control the knee become weakened and shrink (atrophy). Knee exercises are designed to build up the tone and strength of the thigh muscles and to improve knee motion. Often times heat used for twenty to thirty minutes before working out will loosen up your  tissues and help with improving the range of motion but do not use heat for the first two weeks following surgery. These exercises can be done on a training (exercise) mat, on the floor, on a table or on a bed. Use what ever works the best and is most comfortable for you Knee exercises include:  . Leg Lifts - While your knee is still immobilized in a splint or cast, you can do straight leg raises. Lift the leg to 60 degrees, hold for 3 sec, and slowly lower the leg. Repeat 10-20 times 2-3 times daily. Perform this exercise against resistance later as your knee gets better.  Isla Pence and Hamstring Sets - Tighten up the muscle on the front of the thigh (Quad) and hold for 5-10 sec. Repeat this 10-20 times hourly. Hamstring sets are done by pushing the foot backward against an object and holding for 5-10 sec. Repeat as with quad sets.   Leg Slides: Lying on your back, slowly slide your foot toward your buttocks, bending your knee up off the floor (only go as far as is comfortable). Then slowly slide your foot back down until your leg is flat on the floor again.  Angel Wings: Lying on your back spread your legs to the side as far apart as you can without causing discomfort.  A rehabilitation program following serious knee injuries can speed recovery and prevent re-injury in the future due to weakened muscles. Contact your doctor or a physical therapist for more information on knee rehabilitation.   POST-OPERATIVE OPIOID TAPER INSTRUCTIONS: . It is important to wean off of your opioid medication as soon as possible. If you do not need pain medication after your surgery it is ok to stop day one. Marland Kitchen Opioids include: o Codeine, Hydrocodone(Norco, Vicodin), Oxycodone(Percocet, oxycontin) and hydromorphone amongst others.  . Long term and even short term use of opiods can cause: o Increased pain response o Dependence o Constipation o Depression o Respiratory depression o And more.  . Withdrawal symptoms can  include o Flu like symptoms o Nausea, vomiting o And more . Techniques to manage these symptoms o Hydrate well o Eat regular healthy meals o Stay active o Use relaxation techniques(deep breathing, meditating, yoga) . Do Not substitute Alcohol to help with tapering . If you have been on opioids for less than two weeks  and do not have pain than it is ok to stop all together.  . Plan to wean off of opioids o This plan should start within one week post op of your joint replacement. o Maintain the same interval or time between taking each dose and first decrease the dose.  o Cut the total daily intake of opioids by one tablet each day o Next start to increase the time between doses. o The last dose that should be eliminated is the evening dose.     IF YOU ARE TRANSFERRED TO A SKILLED REHAB FACILITY If the patient is transferred to a skilled rehab facility following release from the hospital, a list of the current medications will be sent to the facility for the patient to continue.  When discharged from the skilled rehab facility, please have the facility set up the patient's Home Health Physical Therapy prior to being released. Also, the skilled facility will be responsible for providing the patient with their medications at time of release from the facility to include their pain medication, the muscle relaxants, and their blood thinner medication. If the patient is still at the rehab facility at time of the two week follow up appointment, the skilled rehab facility will also need to assist the patient in arranging follow up appointment in our office and any transportation needs.  MAKE SURE YOU:  . Understand these instructions.  . Get help right away if you are not doing well or get worse.   DENTAL ANTIBIOTICS:  In most cases prophylactic antibiotics for Dental procdeures after total joint surgery are not necessary.  Exceptions are as follows:  1. History of prior total joint  infection  2. Severely immunocompromised (Organ Transplant, cancer chemotherapy, Rheumatoid biologic meds such as Humera)  3. Poorly controlled diabetes (A1C &gt; 8.0, blood glucose over 200)  If you have one of these conditions, contact your surgeon for an antibiotic prescription, prior to your dental procedure.    Pick up stool softner and laxative for home use following surgery while on pain medications. Do not submerge incision under water. Please use good hand washing techniques while changing dressing each day. May shower starting three days after surgery. Please use a clean towel to pat the incision dry following showers. Continue to use ice for pain and swelling after surgery. Do not use any lotions or creams on the incision until instructed by your surgeon.  ___________________________________________________________________  Information on my medicine - XARELTO (Rivaroxaban)  This medication education was reviewed with me or my healthcare representative as part of my discharge preparation.   Why was Xarelto prescribed for you? Xarelto was prescribed for you to reduce the risk of blood clots forming after orthopedic surgery. The medical term for these abnormal blood clots is venous thromboembolism (VTE).  What do you need to know about xarelto ? Take your Xarelto ONCE DAILY at the same time every day. You may take it either with or without food.  If you have difficulty swallowing the tablet whole, you may crush it and mix in applesauce just prior to taking your dose.  Take Xarelto exactly as prescribed by your doctor and DO NOT stop taking Xarelto without talking to the doctor who prescribed the medication.  Stopping without other VTE prevention medication to take the place of Xarelto may increase your risk of developing a clot.  After discharge, you should have regular check-up appointments with your healthcare provider that is prescribing your Xarelto.    What  do you  do if you miss a dose? If you miss a dose, take it as soon as you remember on the same day then continue your regularly scheduled once daily regimen the next day. Do not take two doses of Xarelto on the same day.   Important Safety Information A possible side effect of Xarelto is bleeding. You should call your healthcare provider right away if you experience any of the following: ? Bleeding from an injury or your nose that does not stop. ? Unusual colored urine (red or dark brown) or unusual colored stools (red or black). ? Unusual bruising for unknown reasons. ? A serious fall or if you hit your head (even if there is no bleeding).  Some medicines may interact with Xarelto and might increase your risk of bleeding while on Xarelto. To help avoid this, consult your healthcare provider or pharmacist prior to using any new prescription or non-prescription medications, including herbals, vitamins, non-steroidal anti-inflammatory drugs (NSAIDs) and supplements.  This website has more information on Xarelto: VisitDestination.com.br.

## 2020-07-23 NOTE — Op Note (Signed)
OPERATIVE REPORT-TOTAL KNEE ARTHROPLASTY   Pre-operative diagnosis- Osteoarthritis  Left knee(s)  Post-operative diagnosis- Osteoarthritis Left knee(s)  Procedure-  Left  Total Knee Arthroplasty  Surgeon- Caitlin Rankin. Shavaun Osterloh, MD  Assistant- Arther Abbott, PA-C   Anesthesia-  Adductor canal block and spinal  EBL- 25 ml   Drains None  Tourniquet time-  Total Tourniquet Time Documented: Thigh (Left) - 31 minutes Total: Thigh (Left) - 31 minutes     Complications- None  Condition-PACU - hemodynamically stable.   Brief Clinical Note  Caitlin Hicks is a 75 y.o. year old female with end stage OA of her left knee with progressively worsening pain and dysfunction. She has constant pain, with activity and at rest and significant functional deficits with difficulties even with ADLs. She has had extensive non-op management including analgesics, injections of cortisone and viscosupplements, and home exercise program, but remains in significant pain with significant dysfunction. Radiographs show bone on bone arthritis patellofemoral. She presents now for left Total Knee Arthroplasty.    Procedure in detail---   The patient is brought into the operating room and positioned supine on the operating table. After successful administration of  Adductor canal block and spinal,   a tourniquet is placed high on the  Left thigh(s) and the lower extremity is prepped and draped in the usual sterile fashion. Time out is performed by the operating team and then the  Left lower extremity is wrapped in Esmarch, knee flexed and the tourniquet inflated to 300 mmHg.       A midline incision is made with a ten blade through the subcutaneous tissue to the level of the extensor mechanism. A fresh blade is used to make a medial parapatellar arthrotomy. Soft tissue over the proximal medial tibia is subperiosteally elevated to the joint line with a knife and into the semimembranosus bursa with a Cobb elevator. Soft  tissue over the proximal lateral tibia is elevated with attention being paid to avoiding the patellar tendon on the tibial tubercle. The patella is everted, knee flexed 90 degrees and the ACL and PCL are removed. Findings are bone on bone patellofemoral and lateral with massive hemosiderin stained synovitis.        The drill is used to create a starting hole in the distal femur and the canal is thoroughly irrigated with sterile saline to remove the fatty contents. The 5 degree Left  valgus alignment guide is placed into the femoral canal and the distal femoral cutting block is pinned to remove 9 mm off the distal femur. Resection is made with an oscillating saw.      The tibia is subluxed forward and the menisci are removed. The extramedullary alignment guide is placed referencing proximally at the medial aspect of the tibial tubercle and distally along the second metatarsal axis and tibial crest. The block is pinned to remove 4mm off the more deficient lateral  side. Resection is made with an oscillating saw. Size 3is the most appropriate size for the tibia and the proximal tibia is prepared with the modular drill and keel punch for that size.      The femoral sizing guide is placed and size 3 is most appropriate. Rotation is marked off the epicondylar axis and confirmed by creating a rectangular flexion gap at 90 degrees. The size 3 cutting block is pinned in this rotation and the anterior, posterior and chamfer cuts are made with the oscillating saw. The intercondylar block is then placed and that cut is made.  Trial size 3 tibial component, trial size 3 posterior stabilized femur and a 8  mm posterior stabilized rotating platform insert trial is placed. Full extension is achieved with excellent varus/valgus and anterior/posterior balance throughout full range of motion. The patella is everted and thickness measured to be 19  mm. Free hand resection is taken to 11 mm, a 35 template is placed, lug holes  are drilled, trial patella is placed, and it tracks normally. Osteophytes are removed off the posterior femur with the trial in place. All trials are removed and the cut bone surfaces prepared with pulsatile lavage. Cement is mixed and once ready for implantation, the size 3 tibial implant, size  3 posterior stabilized femoral component, and the size 35 patella are cemented in place and the patella is held with the clamp. The trial insert is placed and the knee held in full extension. The Exparel (20 ml mixed with 60 ml saline) is injected into the extensor mechanism, posterior capsule, medial and lateral gutters and subcutaneous tissues.  All extruded cement is removed and once the cement is hard the permanent 8 mm posterior stabilized rotating platform insert is placed into the tibial tray.      The wound is copiously irrigated with saline solution and the extensor mechanism closed with # 0 Stratofix suture. The tourniquet is released for a total tourniquet time of 31  minutes. Flexion against gravity is 140 degrees and the patella tracks normally. Subcutaneous tissue is closed with 2.0 vicryl and subcuticular with running 4.0 Monocryl. The incision is cleaned and dried and steri-strips and a bulky sterile dressing are applied. The limb is placed into a knee immobilizer and the patient is awakened and transported to recovery in stable condition.      Please note that a surgical assistant was a medical necessity for this procedure in order to perform it in a safe and expeditious manner. Surgical assistant was necessary to retract the ligaments and vital neurovascular structures to prevent injury to them and also necessary for proper positioning of the limb to allow for anatomic placement of the prosthesis.   Caitlin Rankin Caitlin Keithley, MD    07/23/2020, 1:59 PM

## 2020-07-24 ENCOUNTER — Encounter (HOSPITAL_COMMUNITY): Payer: Self-pay | Admitting: Orthopedic Surgery

## 2020-07-24 DIAGNOSIS — Z20822 Contact with and (suspected) exposure to covid-19: Secondary | ICD-10-CM | POA: Diagnosis not present

## 2020-07-24 DIAGNOSIS — M1712 Unilateral primary osteoarthritis, left knee: Secondary | ICD-10-CM | POA: Diagnosis not present

## 2020-07-24 DIAGNOSIS — M00062 Staphylococcal arthritis, left knee: Secondary | ICD-10-CM | POA: Diagnosis not present

## 2020-07-24 LAB — CBC
HCT: 28.2 % — ABNORMAL LOW (ref 36.0–46.0)
Hemoglobin: 9.1 g/dL — ABNORMAL LOW (ref 12.0–15.0)
MCH: 27.9 pg (ref 26.0–34.0)
MCHC: 32.3 g/dL (ref 30.0–36.0)
MCV: 86.5 fL (ref 80.0–100.0)
Platelets: 175 10*3/uL (ref 150–400)
RBC: 3.26 MIL/uL — ABNORMAL LOW (ref 3.87–5.11)
RDW: 14.6 % (ref 11.5–15.5)
WBC: 9.4 10*3/uL (ref 4.0–10.5)
nRBC: 0 % (ref 0.0–0.2)

## 2020-07-24 LAB — BASIC METABOLIC PANEL
Anion gap: 5 (ref 5–15)
BUN: 15 mg/dL (ref 8–23)
CO2: 23 mmol/L (ref 22–32)
Calcium: 8.6 mg/dL — ABNORMAL LOW (ref 8.9–10.3)
Chloride: 108 mmol/L (ref 98–111)
Creatinine, Ser: 0.6 mg/dL (ref 0.44–1.00)
GFR, Estimated: >60 mL/min (ref >60)
Glucose, Bld: 170 mg/dL — ABNORMAL HIGH (ref 70–99)
Potassium: 3.8 mmol/L (ref 3.5–5.1)
Sodium: 136 mmol/L (ref 135–145)

## 2020-07-24 MED ORDER — TRAMADOL HCL 50 MG PO TABS: 50.0000 mg | ORAL_TABLET | Freq: Four times a day (QID) | ORAL | 0 refills | Status: DC | PRN

## 2020-07-24 MED ORDER — OXYCODONE HCL 5 MG PO TABS: 5.0000 mg | ORAL_TABLET | Freq: Four times a day (QID) | ORAL | 0 refills | Status: DC | PRN

## 2020-07-24 MED ORDER — SODIUM CHLORIDE 0.9 % IV BOLUS
250.0000 mL | Freq: Once | INTRAVENOUS | Status: AC
  Administered 2020-07-24: 250 mL via INTRAVENOUS

## 2020-07-24 MED ORDER — RIVAROXABAN 10 MG PO TABS: 10.0000 mg | ORAL_TABLET | Freq: Every day | ORAL | 0 refills | Status: DC

## 2020-07-24 NOTE — Progress Notes (Signed)
Physical Therapy Treatment Patient Details Name: Caitlin Hicks MRN: [id number] DOB: 03/20/1946 Today's Date: 07/24/2020    History of Present Illness Patient is 75 y.o. female s/p Lt TKA on 07/23/20 with PMH significant for WPWS, Osteoporosis, GERD, OA.    PT Comments    POD # 1 pm session With spouse present for "hand on" instruction on all mobility including stairs. Addressed all mobility questions, discussed appropriate activity, educated on use of ICE.  Pt ready for D/C to home.   Follow Up Recommendations  Follow surgeon's recommendation for DC plan and follow-up therapies;Outpatient PT     Equipment Recommendations  Rolling walker with 5" wheels    Recommendations for Other Services       Precautions / Restrictions Precautions Precautions: Fall Precaution Comments: instructed no pillow under knee Restrictions Weight Bearing Restrictions: No Other Position/Activity Restrictions: WBAT    Mobility  Bed Mobility               General bed mobility comments: OOB in recliner    Transfers Overall transfer level: Needs assistance Equipment used: Rolling walker (2 wheeled) Transfers: Sit to/from Stand Sit to Stand: Supervision;Min guard         General transfer comment: Cue for hand placement. assist for power up and to steady initially.  Ambulation/Gait Ambulation/Gait assistance: Min guard;Supervision Gait Distance (Feet): 65 Feet Assistive device: Rolling walker (2 wheeled) Gait Pattern/deviations: Step-to pattern;Decreased stride length;Decreased weight shift to left Gait velocity: decr   General Gait Details: cues for step to pattern and proximity to RW, cues for safety at start. pt with slightly NBOS but no overt LOB or buckling at Lt knee.   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, civil (consulting) very Advice worker Comments        Pertinent Vitals/Pain Pain Assessment: 0-10 Pain Score: 5  Pain Location: Lt TKA Pain Descriptors / Indicators: Aching;Discomfort;Operative site guarding Pain Intervention(s): Monitored during session;Premedicated before session;Repositioned;Ice applied    Home Living                      Prior Function            PT Goals (current goals can now be found in the care plan section) Progress towards PT goals: Progressing toward goals    Frequency    7X/week      PT Plan Current plan remains appropriate    Co-evaluation              AM-PAC PT "6 Clicks" Mobility   Outcome Measure  Help needed turning from your back to your side  while in a flat bed without using bedrails?: A Little Help needed moving from lying on your back to sitting on the side of a flat bed without using bedrails?: A Little Help needed moving to and from a bed to a chair (including a wheelchair)?: A Little Help needed standing up from a chair using your arms (e.g., wheelchair or bedside chair)?: A Little Help needed to walk in hospital room?: A Little Help needed climbing 3-5 steps with a railing? : A Little 6 Click Score: 18    End of Session Equipment Utilized During Treatment: Gait belt Activity Tolerance: Patient tolerated treatment well Patient left: in chair;with call bell/phone within reach;with chair alarm set;with family/visitor present Nurse Communication: Mobility status PT Visit Diagnosis: Muscle weakness (generalized)  (M62.81);Difficulty in walking, not elsewhere classified (R26.2)     Time: 1884-1660 PT Time Calculation (min) (ACUTE ONLY): 25 min  Charges:  $Gait Training: 8-22 mins $Therapeutic Activity: 8-22 mins                     Felecia Shelling  PTA Acute  Rehabilitation Services Pager      [phone] Office      [phone]

## 2020-07-24 NOTE — Progress Notes (Signed)
   Subjective: 1 Day Post-Op Procedure(s) (LRB): TOTAL KNEE ARTHROPLASTY (Left) Patient reports pain as mild.   Patient seen in rounds by Dr. Lequita Halt. Patient is well, and has had no acute complaints or problems. No issues overnight, denies chest pain, SOB, or alf pain. Foley catheter to be removed this AM. We will continue therapy today, ambulated 104' yesterday.   Objective: Vital signs in last 24 hours: Temp:  [97.6 F (36.4 C)-98.6 F (37 C)] 97.9 F (36.6 C) (05/03 0519) Pulse Rate:  [54-70] 61 (05/03 0519) Resp:  [7-21] 16 (05/03 0108) BP: (90-138)/(44-87) 102/53 (05/03 0519) SpO2:  [98 %-100 %] 100 % (05/03 0519)  Intake/Output from previous day:  Intake/Output Summary (Last 24 hours) at 07/24/2020 0753 Last data filed at 07/24/2020 7858 Gross per 24 hour  Intake 3202.5 ml  Output 2120 ml  Net 1082.5 ml     Intake/Output this shift: No intake/output data recorded.  Labs: Recent Labs    07/24/20 0301  HGB 9.1*   Recent Labs    07/24/20 0301  WBC 9.4  RBC 3.26*  HCT 28.2*  PLT 175   Recent Labs    07/24/20 0301  NA 136  K 3.8  CL 108  CO2 23  BUN 15  CREATININE 0.60  GLUCOSE 170*  CALCIUM 8.6*   No results for input(s): LABPT, INR in the last 72 hours.  Exam: General - Patient is Alert and Oriented Extremity - Neurologically intact Neurovascular intact Sensation intact distally Dorsiflexion/Plantar flexion intact Dressing - dressing C/D/I Motor Function - intact, moving foot and toes well on exam.   Past Medical History:  Diagnosis Date  . Arthritis    "knees, fingers", knees,  . Colitis   . Dysrhythmia    , WPW,SVT  . GERD (gastroesophageal reflux disease)   . Osteoporosis   . Segmental colitis (HCC)   . Ulcerative colitis    age 75  . WPW (Wolff-Parkinson-White syndrome)     Assessment/Plan: 1 Day Post-Op Procedure(s) (LRB): TOTAL KNEE ARTHROPLASTY (Left) Principal Problem:   OA (osteoarthritis) of knee Active Problems:    Primary osteoarthritis of left knee  Estimated body mass index is 20.28 kg/m as calculated from the following:   Height as of 07/16/20: 4' 11.75" (1.518 m).   Weight as of 07/16/20: 46.7 kg. Advance diet Up with therapy D/C IV fluids   Patient's anticipated LOS is less than 2 midnights, meeting these requirements: - Younger than 46 - Lives within 1 hour of care - Has a competent adult at home to recover with post-op recover - NO history of  - Chronic pain requiring opiods  - Diabetes  - Coronary Artery Disease  - Heart failure  - Heart attack  - Stroke  - DVT/VTE  - Cardiac arrhythmia  - Respiratory Failure/COPD  - Renal failure  - Anemia  - Advanced Liver disease  DVT Prophylaxis - Xarelto Weight bearing as tolerated. Continue therapy.  BP soft this AM, 250 mL bolus ordered.  Plan is to go Home after hospital stay. Plan for discharge later today if feeling well and cleared by PT. Scheduled [facility] at [facility]. Follow-up in the office in 2 weeks   The PDMP database was reviewed today prior to any opioid medications being prescribed to this patient.  Arther Abbott, PA-C Orthopedic Surgery [phone] 07/24/2020, 7:53 AM

## 2020-07-24 NOTE — TOC Transition Note (Signed)
Transition of Care [facility]) - CM/SW Discharge Note   Patient Details  Name: Caitlin Hicks MRN: [id number] Date of Birth: [birth year]  Transition of Care [facility]) CM/SW Contact:  Lennart Pall, LCSW Phone Number: 07/24/2020, 1:37 PM   Clinical Narrative:    Met briefly with pt and confirming she does need youth rolling walker - order placed with [employer].  Plan for OPPT at [facility].  No further TOC needs.   Final next level of care: OP Rehab Barriers to Discharge: No Barriers Identified   Patient Goals and CMS Choice Patient states their goals for this hospitalization and ongoing recovery are:: return home      Discharge Placement                       Discharge Plan and Services                DME Arranged: Gilford Rile youth DME Agency: [employer] Date DME Agency Contacted: 07/24/20 Time DME Agency Contacted: [phone] Representative spoke with at DME Agency: Ovid Curd HH Arranged: NA [place] Agency: NA        Social Determinants of Health ([place]) Interventions     Readmission Risk Interventions No flowsheet data found.

## 2020-07-24 NOTE — Progress Notes (Signed)
Pt was discharged home today. Instructions were reviewed with patient, and questions were answered. Pt was taken to main entrance via wheelchair by NT.  

## 2020-07-24 NOTE — Progress Notes (Signed)
Physical Therapy Treatment Patient Details Name: Caitlin Hicks MRN: [id number] DOB: Jan 15, 1946 Today's Date: 07/24/2020    History of Present Illness Patient is 75 y.o. female s/p Lt TKA on 07/23/20 with PMH significant for WPWS, Osteoporosis, GERD, OA.    PT Comments    POD # 1 am session Assisted with amb a functional distance in hallway Then returned to room to perform some TE's following HEP handout.  Instructed on proper tech, freq as well as use of ICE.   Pt will need another session to practice stairs when spouse arrives and complete HEP.   Follow Up Recommendations  Follow surgeon's recommendation for DC plan and follow-up therapies;Outpatient PT     Equipment Recommendations  Rolling walker with 5" wheels    Recommendations for Other Services       Precautions / Restrictions Precautions Precautions: Fall Precaution Comments: instructed no pillow under knee Restrictions Weight Bearing Restrictions: No Other Position/Activity Restrictions: WBAT    Mobility  Bed Mobility               General bed mobility comments: OOB in recliner    Transfers Overall transfer level: Needs assistance Equipment used: Rolling walker (2 wheeled) Transfers: Sit to/from Stand Sit to Stand: Supervision;Min guard         General transfer comment: Cue for hand placement. assist for power up and to steady initially.  Ambulation/Gait Ambulation/Gait assistance: Min guard;Supervision Gait Distance (Feet): 65 Feet Assistive device: Rolling walker (2 wheeled) Gait Pattern/deviations: Step-to pattern;Decreased stride length;Decreased weight shift to left Gait velocity: decr   General Gait Details: cues for step to pattern and proximity to RW, cues for safety at start. pt with slightly NBOS but no overt LOB or buckling at Lt knee.   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, civil (consulting) very pleasant      Exercises   Total Knee Replacement TE's following HEP handout 10 reps B LE ankle pumps 05 reps towel squeezes 05 reps knee presses 05 reps heel slides  05 reps SAQ's 05 reps SLR's 05 reps ABD Educated on use of gait belt to assist with TE's Followed by ICE     General Comments        Pertinent Vitals/Pain Pain Assessment: 0-10 Pain Score: 5  Pain Location: Lt TKA Pain Descriptors / Indicators: Aching;Discomfort;Operative site guarding Pain Intervention(s): Monitored during session;Premedicated before session;Repositioned;Ice applied    Home Living                      Prior Function            PT Goals (current goals can now be found in the care plan section) Progress towards PT goals: Progressing toward goals    Frequency    7X/week      PT Plan Current plan remains  appropriate    Co-evaluation              AM-PAC PT "6 Clicks" Mobility   Outcome Measure  Help needed turning from your back to your side while in a flat bed without using bedrails?: A Little Help needed moving from lying on your back to sitting on the side of a flat bed without using bedrails?: A Little Help needed moving to and from a bed to a chair (including a wheelchair)?: A Little Help needed standing up from a chair using your arms (e.g., wheelchair or bedside chair)?: A Little Help needed to walk in hospital room?: A Little Help needed climbing 3-5 steps with a railing? : A Little 6 Click Score: 18    End of Session Equipment Utilized During Treatment: Gait belt Activity Tolerance: Patient tolerated treatment well Patient left: in chair;with call bell/phone within reach;with chair  alarm set;with family/visitor present Nurse Communication: Mobility status PT Visit Diagnosis: Muscle weakness (generalized) (M62.81);Difficulty in walking, not elsewhere classified (R26.2)     Time: 9417-4081 PT Time Calculation (min) (ACUTE ONLY): 25 min  Charges:  $Gait Training: 8-22 mins $Therapeutic Exercise: 8-22 mins                     Felecia Shelling  PTA Acute  Rehabilitation Services Pager      [phone] Office      [phone]

## 2020-07-24 NOTE — Plan of Care (Signed)
  Problem: Education: Goal: Knowledge of the prescribed therapeutic regimen will improve Outcome: Completed/Met Goal: Individualized Educational Video(s) Outcome: Completed/Met   Problem: Activity: Goal: Ability to avoid complications of mobility impairment will improve Outcome: Completed/Met Goal: Range of joint motion will improve Outcome: Completed/Met   Problem: Clinical Measurements: Goal: Postoperative complications will be avoided or minimized Outcome: Completed/Met   Problem: Pain Management: Goal: Pain level will decrease with appropriate interventions Outcome: Completed/Met   Problem: Skin Integrity: Goal: Will show signs of wound healing Outcome: Completed/Met   Problem: Education: Goal: Knowledge of General Education information will improve Description: Including pain rating scale, medication(s)/side effects and non-pharmacologic comfort measures Outcome: Completed/Met   Problem: Health Behavior/Discharge Planning: Goal: Ability to manage health-related needs will improve Outcome: Completed/Met   Problem: Clinical Measurements: Goal: Ability to maintain clinical measurements within normal limits will improve Outcome: Completed/Met Goal: Will remain free from infection Outcome: Completed/Met Goal: Diagnostic test results will improve Outcome: Completed/Met Goal: Respiratory complications will improve Outcome: Completed/Met Goal: Cardiovascular complication will be avoided Outcome: Completed/Met   Problem: Activity: Goal: Risk for activity intolerance will decrease Outcome: Completed/Met   Problem: Nutrition: Goal: Adequate nutrition will be maintained Outcome: Completed/Met   Problem: Coping: Goal: Level of anxiety will decrease Outcome: Completed/Met   Problem: Elimination: Goal: Will not experience complications related to bowel motility Outcome: Completed/Met Goal: Will not experience complications related to urinary retention Outcome:  Completed/Met   Problem: Pain Managment: Goal: General experience of comfort will improve Outcome: Completed/Met   Problem: Safety: Goal: Ability to remain free from injury will improve Outcome: Completed/Met   Problem: Skin Integrity: Goal: Risk for impaired skin integrity will decrease Outcome: Completed/Met   

## 2020-07-26 DIAGNOSIS — M25561 Pain in right knee: Secondary | ICD-10-CM | POA: Diagnosis not present

## 2020-07-30 DIAGNOSIS — M25562 Pain in left knee: Secondary | ICD-10-CM | POA: Diagnosis not present

## 2020-07-30 NOTE — Discharge Summary (Signed)
Physician Discharge Summary   Patient ID: Caitlin Hicks MRN: [id number] DOB/AGE: 11-20-1945 75 y.o.  Admit date: 07/23/2020 Discharge date: 07/24/2020  Primary Diagnosis: Osteoarthritis, left knee   Admission Diagnoses:  Past Medical History:  Diagnosis Date  . Arthritis    "knees, fingers", knees,  . Colitis   . Dysrhythmia    , WPW,SVT  . GERD (gastroesophageal reflux disease)   . Osteoporosis   . Segmental colitis (HCC)   . Ulcerative colitis    age 75  . WPW (Wolff-Parkinson-White syndrome)    Discharge Diagnoses:   Principal Problem:   OA (osteoarthritis) of knee Active Problems:   Primary osteoarthritis of left knee  Estimated body mass index is 20.28 kg/m as calculated from the following:   Height as of 07/16/20: 4' 11.75" (1.518 m).   Weight as of 07/16/20: 46.7 kg.  Procedure:  Procedure(s) (LRB): TOTAL KNEE ARTHROPLASTY (Left)   Consults: None  HPI: Caitlin Hicks is a 75 y.o. year old female with end stage OA of her left knee with progressively worsening pain and dysfunction. She has constant pain, with activity and at rest and significant functional deficits with difficulties even with ADLs. She has had extensive non-op management including analgesics, injections of cortisone and viscosupplements, and home exercise program, but remains in significant pain with significant dysfunction. Radiographs show bone on bone arthritis patellofemoral. She presents now for left Total Knee Arthroplasty.  Laboratory Data: Admission on 07/23/2020, Discharged on 07/24/2020  Component Date Value Ref Range Status  . ABO/RH(D) 07/23/2020    Final                   Value:O POS Performed at [facility], [address], [place], [place] [address]   . SARS Coronavirus 2 07/23/2020 NEGATIVE  NEGATIVE Final   Comment: (NOTE) SARS-CoV-2 target nucleic acids are NOT DETECTED.  The SARS-CoV-2 RNA is generally detectable in upper and lower respiratory specimens  during the acute phase of infection. The lowest concentration of SARS-CoV-2 viral copies this assay can detect is 250 copies / mL. A negative result does not preclude SARS-CoV-2 infection and should not be used as the sole basis for treatment or other patient management decisions.  A negative result may occur with improper specimen collection / handling, submission of specimen other than nasopharyngeal swab, presence of viral mutation(s) within the areas targeted by this assay, and inadequate number of viral copies (<250 copies / mL). A negative result must be combined with clinical observations, patient history, and epidemiological information.  Fact Sheet for Patients:   BoilerBrush.com.cy  Fact Sheet for Healthcare Providers: https://pope.com/  This test is not yet approved or                           cleared by the [place] FDA and has been authorized for detection and/or diagnosis of SARS-CoV-2 by FDA under an Emergency Use Authorization (EUA).  This EUA will remain in effect (meaning this test can be used) for the duration of the COVID-19 declaration under Section 564(b)(1) of the Act, 21 U.S.C. section 360bbb-3(b)(1), unless the authorization is terminated or revoked sooner.  Performed at [facility], [address] [place], [place] [address]   . WBC 07/24/2020 9.4  4.0 - 10.5 K/uL Final  . RBC 07/24/2020 3.26* 3.87 - 5.11 MIL/uL Final  . Hemoglobin 07/24/2020 9.1* 12.0 - 15.0 g/dL Final  . HCT 65/05/5463 28.2* 36.0 -  46.0 % Final  . MCV 07/24/2020 86.5  80.0 - 100.0 fL Final  . MCH 07/24/2020 27.9  26.0 - 34.0 pg Final  . MCHC 07/24/2020 32.3  30.0 - 36.0 g/dL Final  . RDW 09/81/191405/05/2020 14.6  11.5 - 15.5 % Final  . Platelets 07/24/2020 175  150 - 400 K/uL Final  . nRBC 07/24/2020 0.0  0.0 - 0.2 % Final   Performed at [facility], [address] [place], [place] [address]  .  Sodium 07/24/2020 136  135 - 145 mmol/L Final  . Potassium 07/24/2020 3.8  3.5 - 5.1 mmol/L Final  . Chloride 07/24/2020 108  98 - 111 mmol/L Final  . CO2 07/24/2020 23  22 - 32 mmol/L Final  . Glucose, Bld 07/24/2020 170* 70 - 99 mg/dL Final   Glucose reference range applies only to samples taken after fasting for at least 8 hours.  . BUN 07/24/2020 15  8 - 23 mg/dL Final  . Creatinine, Ser 07/24/2020 0.60  0.44 - 1.00 mg/dL Final  . Calcium 62/13/086505/05/2020 8.6* 8.9 - 10.3 mg/dL Final  . GFR, Estimated 07/24/2020 >60  >60 mL/min Final   Comment: (NOTE) Calculated using the CKD-EPI Creatinine Equation (2021)   . Anion gap 07/24/2020 5  5 - 15 Final   Performed at [facility], [address] [place], [place] [address]  Hospital Outpatient Visit on 07/16/2020  Component Date Value Ref Range Status  . WBC 07/16/2020 5.5  4.0 - 10.5 K/uL Final  . RBC 07/16/2020 4.69  3.87 - 5.11 MIL/uL Final  . Hemoglobin 07/16/2020 13.1  12.0 - 15.0 g/dL Final  . HCT 62/95/284104/25/2022 40.9  36.0 - 46.0 % Final  . MCV 07/16/2020 87.2  80.0 - 100.0 fL Final  . MCH 07/16/2020 27.9  26.0 - 34.0 pg Final  . MCHC 07/16/2020 32.0  30.0 - 36.0 g/dL Final  . RDW 32/44/010204/25/2022 14.9  11.5 - 15.5 % Final  . Platelets 07/16/2020 224  150 - 400 K/uL Final  . nRBC 07/16/2020 0.0  0.0 - 0.2 % Final   Performed at [facility], [address] [place], [place] [address]  . Sodium 07/16/2020 141  135 - 145 mmol/L Final  . Potassium 07/16/2020 4.2  3.5 - 5.1 mmol/L Final  . Chloride 07/16/2020 108  98 - 111 mmol/L Final  . CO2 07/16/2020 26  22 - 32 mmol/L Final  . Glucose, Bld 07/16/2020 93  70 - 99 mg/dL Final   Glucose reference range applies only to samples taken after fasting for at least 8 hours.  . BUN 07/16/2020 19  8 - 23 mg/dL Final  . Creatinine, Ser 07/16/2020 0.66  0.44 - 1.00 mg/dL Final  . Calcium 64/40/347404/25/2022 9.8  8.9 - 10.3 mg/dL Final  . Total Protein 07/16/2020 6.6  6.5 - 8.1  g/dL Final  . Albumin 25/95/638704/25/2022 4.2  3.5 - 5.0 g/dL Final  . AST 56/43/329504/25/2022 18  15 - 41 U/L Final  . ALT 07/16/2020 14  0 - 44 U/L Final  . Alkaline Phosphatase 07/16/2020 80  38 - 126 U/L Final  . Total Bilirubin 07/16/2020 0.8  0.3 - 1.2 mg/dL Final  . GFR, Estimated 07/16/2020 >60  >60 mL/min Final   Comment: (NOTE) Calculated using the CKD-EPI Creatinine Equation (2021)   . Anion gap 07/16/2020 7  5 - 15 Final   Performed at [facility], [address] [place], [place]  [address]  . Prothrombin Time 07/16/2020 13.0  11.4 - 15.2 seconds Final  . INR 07/16/2020 1.0  0.8 - 1.2 Final   Comment: (NOTE) INR goal varies based on device and disease states. Performed at [facility], [address], [place], [place] [address]   . aPTT 07/16/2020 34  24 - 36 seconds Final   Performed at [facility], [address] [place], [place] [address]  . ABO/RH(D) 07/16/2020 O POS   Final  . Antibody Screen 07/16/2020 NEG   Final  . Sample Expiration 07/16/2020 07/26/2020,2359   Final  . Extend sample reason 07/16/2020    Final                   Value:NO TRANSFUSIONS OR PREGNANCY IN THE PAST 3 MONTHS Performed at [facility], [address], [place], [place] [address]   . MRSA, PCR 07/16/2020 NEGATIVE  NEGATIVE Final  . Staphylococcus aureus 07/16/2020 NEGATIVE  NEGATIVE Final   Comment: (NOTE) The Xpert SA Assay (FDA approved for NASAL specimens in patients 76 years of age and older), is one component of a comprehensive surveillance program. It is not intended to diagnose infection nor to guide or monitor treatment. Performed at [facility], [address], [place], [place] [address]      X-Rays:No results found.  EKG: Orders placed or performed during the hospital encounter of 07/16/20  . EKG 12 lead per protocol  . EKG 12 lead per protocol     Hospital Course: Caitlin Hicks is a 75  y.o. who was admitted to [facility]. They were brought to the operating room on 07/23/2020 and underwent Procedure(s): TOTAL KNEE ARTHROPLASTY.  Patient tolerated the procedure well and was later transferred to the recovery room and then to the orthopaedic floor for postoperative care. They were given PO and IV analgesics for pain control following their surgery. They were given 24 hours of postoperative antibiotics of  Anti-infectives (From admission, onward)   Start     Dose/Rate Route Frequency Ordered Stop   07/23/20 1900  ceFAZolin (ANCEF) IVPB 2g/100 mL premix        2 g 200 mL/hr over 30 Minutes Intravenous Every 6 hours 07/23/20 1619 07/24/20 0155   07/23/20 0930  ceFAZolin (ANCEF) IVPB 2g/100 mL premix        2 g 200 mL/hr over 30 Minutes Intravenous On call to O.R. 07/23/20 9379 07/23/20 1301     and started on DVT prophylaxis in the form of Xarelto.   PT and OT were ordered for total joint protocol. Discharge planning consulted to help with postop disposition and equipment needs.  Patient had a good night on the evening of surgery. They started to get up OOB with therapy on POD #0. Pt was seen during rounds and was ready to go home pending progress with therapy. BP was soft, 250 mL bolus was ordered with improvement. She worked with therapy on POD #1 and was meeting her goals. Pt was discharged to home later that day in stable condition.  Diet: Regular diet Activity: WBAT Follow-up: in 2 weeks Disposition: Home with OPPT Discharged Condition: stable   Discharge Instructions    Call MD / Call 911   Complete by: As directed    If you experience chest pain or shortness of breath, CALL 911 and be transported to the hospital emergency room.  If you develope a fever above 101 F, pus (white  drainage) or increased drainage or redness at the wound, or calf pain, call your surgeon's office.   Change dressing   Complete by: As directed    You may remove the bulky bandage (ACE wrap  and gauze) two days after surgery. You will have an adhesive waterproof bandage underneath. Leave this in place until your first follow-up appointment.   Constipation Prevention   Complete by: As directed    Drink plenty of fluids.  Prune juice may be helpful.  You may use a stool softener, such as Colace (over the counter) 100 mg twice a day.  Use MiraLax (over the counter) for constipation as needed.   Diet - low sodium heart healthy   Complete by: As directed    Do not put a pillow under the knee. Place it under the heel.   Complete by: As directed    Driving restrictions   Complete by: As directed    No driving for two weeks   Post-operative opioid taper instructions:   Complete by: As directed    POST-OPERATIVE OPIOID TAPER INSTRUCTIONS: It is important to wean off of your opioid medication as soon as possible. If you do not need pain medication after your surgery it is ok to stop day one. Opioids include: Codeine, Hydrocodone(Norco, Vicodin), Oxycodone(Percocet, oxycontin) and hydromorphone amongst others.  Long term and even short term use of opiods can cause: Increased pain response Dependence Constipation Depression Respiratory depression And more.  Withdrawal symptoms can include Flu like symptoms Nausea, vomiting And more Techniques to manage these symptoms Hydrate well Eat regular healthy meals Stay active Use relaxation techniques(deep breathing, meditating, yoga) Do Not substitute Alcohol to help with tapering If you have been on opioids for less than two weeks and do not have pain than it is ok to stop all together.  Plan to wean off of opioids This plan should start within one week post op of your joint replacement. Maintain the same interval or time between taking each dose and first decrease the dose.  Cut the total daily intake of opioids by one tablet each day Next start to increase the time between doses. The last dose that should be eliminated is the  evening dose.      TED hose   Complete by: As directed    Use stockings (TED hose) for three weeks on both leg(s).  You may remove them at night for sleeping.   Weight bearing as tolerated   Complete by: As directed      Allergies as of 07/24/2020      Reactions   Other Rash   Sulfonamide Derivatives Rash   Sulfa Antibiotics Rash   Nsaids Other (See Comments)   Contraindicated with lialda   Penicillins Rash   Tolerated Cephalosporin Date: 07/24/20.      Medication List    STOP taking these medications   cholecalciferol 25 MCG (1000 UNIT) tablet Commonly known as: VITAMIN D3   PRESERVISION AREDS 2 PO     TAKE these medications   calcium carbonate 750 MG chewable tablet Commonly known as: TUMS EX Chew 1 tablet by mouth daily as needed for heartburn.   CITRUCEL PO Take 0.5 tablets by mouth daily as needed (regularity).   colchicine 0.6 MG tablet Take 0.6 mg by mouth daily as needed (gout).   famotidine-calcium carbonate-magnesium hydroxide 10-800-165 MG chewable tablet Commonly known as: PEPCID COMPLETE Chew 1 tablet by mouth daily as needed (acid reflux).   mesalamine 1.2 g EC tablet  Commonly known as: LIALDA Take 2.4 g by mouth 2 (two) times daily. Two tablets in the morning and Two tablets at bedtime   oxyCODONE 5 MG immediate release tablet Commonly known as: Oxy IR/ROXICODONE Take 1-2 tablets (5-10 mg total) by mouth every 6 (six) hours as needed for severe pain.   propranolol 10 MG tablet Commonly known as: INDERAL Take 1-2 tablets at onset of SVT.  May take up to 40 mg (4 tabs) in one hour. What changed:   how to take this  when to take this   rivaroxaban 10 MG Tabs tablet Commonly known as: XARELTO Take 1 tablet (10 mg total) by mouth daily with breakfast for 20 days.   traMADol 50 MG tablet Commonly known as: ULTRAM Take 1-2 tablets (50-100 mg total) by mouth every 6 (six) hours as needed for moderate pain. What changed:   how much to  take  when to take this  reasons to take this            Discharge Care Instructions  (From admission, onward)         Start     Ordered   07/24/20 0000  Weight bearing as tolerated        07/24/20 0757   07/24/20 0000  Change dressing       Comments: You may remove the bulky bandage (ACE wrap and gauze) two days after surgery. You will have an adhesive waterproof bandage underneath. Leave this in place until your first follow-up appointment.   07/24/20 0757          Follow-up Information    [facility] Go on 07/26/2020.   Why: You are scheduled for a physical therapy appointment on 07-26-20 at 11:00 am. Contact information: [address] [place] [address] [phone]        Ollen Gross, MD. Schedule an appointment as soon as possible for a visit on 08/07/2020.   Specialty: Orthopedic Surgery Contact information: [address] [place] [address] [phone]               Signed: Arther Abbott, PA-C Orthopedic Surgery 07/30/2020, 7:32 AM

## 2020-08-02 DIAGNOSIS — M25561 Pain in right knee: Secondary | ICD-10-CM | POA: Diagnosis not present

## 2020-08-06 DIAGNOSIS — M25561 Pain in right knee: Secondary | ICD-10-CM | POA: Diagnosis not present

## 2020-08-08 DIAGNOSIS — M25562 Pain in left knee: Secondary | ICD-10-CM | POA: Diagnosis not present

## 2020-08-10 DIAGNOSIS — M25562 Pain in left knee: Secondary | ICD-10-CM | POA: Diagnosis not present

## 2020-08-13 DIAGNOSIS — M25562 Pain in left knee: Secondary | ICD-10-CM | POA: Diagnosis not present

## 2020-08-15 DIAGNOSIS — M25562 Pain in left knee: Secondary | ICD-10-CM | POA: Diagnosis not present

## 2020-08-17 DIAGNOSIS — M25562 Pain in left knee: Secondary | ICD-10-CM | POA: Diagnosis not present

## 2020-08-21 ENCOUNTER — Encounter: Payer: Self-pay | Admitting: Family Medicine

## 2020-08-21 ENCOUNTER — Ambulatory Visit (INDEPENDENT_AMBULATORY_CARE_PROVIDER_SITE_OTHER): Payer: Medicare PPO | Admitting: Family Medicine

## 2020-08-21 ENCOUNTER — Other Ambulatory Visit: Payer: Self-pay

## 2020-08-21 VITALS — BP 120/64 | HR 82 | Temp 97.7°F | Ht 59.75 in | Wt 98.5 lb

## 2020-08-21 DIAGNOSIS — Z0000 Encounter for general adult medical examination without abnormal findings: Secondary | ICD-10-CM | POA: Diagnosis not present

## 2020-08-21 DIAGNOSIS — M25562 Pain in left knee: Secondary | ICD-10-CM | POA: Diagnosis not present

## 2020-08-21 LAB — LDL CHOLESTEROL, DIRECT: Direct LDL: 88 mg/dL

## 2020-08-21 LAB — BASIC METABOLIC PANEL
BUN: 14 mg/dL (ref 6–23)
CO2: 26 mEq/L (ref 19–32)
Calcium: 9.7 mg/dL (ref 8.4–10.5)
Chloride: 104 mEq/L (ref 96–112)
Creatinine, Ser: 0.77 mg/dL (ref 0.40–1.20)
GFR: 74.5 mL/min (ref >60.00)
Glucose, Bld: 85 mg/dL (ref 70–99)
Potassium: 3.9 mEq/L (ref 3.5–5.1)
Sodium: 138 mEq/L (ref 135–145)

## 2020-08-21 LAB — CBC WITH DIFFERENTIAL/PLATELET
Basophils Absolute: 0 10*3/uL (ref 0.0–0.1)
Basophils Relative: 0.8 % (ref 0.0–3.0)
Eosinophils Absolute: 0 10*3/uL (ref 0.0–0.7)
Eosinophils Relative: 0.5 % (ref 0.0–5.0)
HCT: 35.8 % — ABNORMAL LOW (ref 36.0–46.0)
Hemoglobin: 11.7 g/dL — ABNORMAL LOW (ref 12.0–15.0)
Lymphocytes Relative: 19.8 % (ref 12.0–46.0)
Lymphs Abs: 1 10*3/uL (ref 0.7–4.0)
MCHC: 32.7 g/dL (ref 30.0–36.0)
MCV: 85.9 fl (ref 78.0–100.0)
Monocytes Absolute: 0.4 10*3/uL (ref 0.1–1.0)
Monocytes Relative: 8.4 % (ref 3.0–12.0)
Neutro Abs: 3.4 10*3/uL (ref 1.4–7.7)
Neutrophils Relative %: 70.5 % (ref 43.0–77.0)
Platelets: 239 10*3/uL (ref 150.0–400.0)
RBC: 4.17 Mil/uL (ref 3.87–5.11)
RDW: 16.8 % — ABNORMAL HIGH (ref 11.5–15.5)
WBC: 4.9 10*3/uL (ref 4.0–10.5)

## 2020-08-21 LAB — LIPID PANEL
Cholesterol: 138 mg/dL (ref 0–200)
HDL: 34.1 mg/dL — ABNORMAL LOW (ref >39.00)
NonHDL: 104.31
Total CHOL/HDL Ratio: 4
Triglycerides: 223 mg/dL — ABNORMAL HIGH (ref 0.0–149.0)
VLDL: 44.6 mg/dL — ABNORMAL HIGH (ref 0.0–40.0)

## 2020-08-21 LAB — TSH: TSH: 3.6 u[IU]/mL (ref 0.35–4.50)

## 2020-08-21 LAB — HEPATIC FUNCTION PANEL
ALT: 7 U/L (ref 0–35)
AST: 10 U/L (ref 0–37)
Albumin: 4.1 g/dL (ref 3.5–5.2)
Alkaline Phosphatase: 90 U/L (ref 39–117)
Bilirubin, Direct: 0.1 mg/dL (ref 0.0–0.3)
Total Bilirubin: 0.5 mg/dL (ref 0.2–1.2)
Total Protein: 5.8 g/dL — ABNORMAL LOW (ref 6.0–8.3)

## 2020-08-21 LAB — T4, FREE: Free T4: 0.94 ng/dL (ref 0.60–1.60)

## 2020-08-21 NOTE — Patient Instructions (Signed)
Preventive Care 77 Years and Older, Female Preventive care refers to lifestyle choices and visits with your health care provider that can promote health and wellness. This includes:  A yearly physical exam. This is also called an annual wellness visit.  Regular dental and eye exams.  Immunizations.  Screening for certain conditions.  Healthy lifestyle choices, such as: ? Eating a healthy diet. ? Getting regular exercise. ? Not using drugs or products that contain nicotine and tobacco. ? Limiting alcohol use. What can I expect for my preventive care visit? Physical exam Your health care provider will check your:  Height and weight. These may be used to calculate your BMI (body mass index). BMI is a measurement that tells if you are at a healthy weight.  Heart rate and blood pressure.  Body temperature.  Skin for abnormal spots. Counseling Your health care provider may ask you questions about your:  Past medical problems.  Family's medical history.  Alcohol, tobacco, and drug use.  Emotional well-being.  Home life and relationship well-being.  Sexual activity.  Diet, exercise, and sleep habits.  History of falls.  Memory and ability to understand (cognition).  Work and work Statistician.  Pregnancy and menstrual history.  Access to firearms. What immunizations do I need? Vaccines are usually given at various ages, according to a schedule. Your health care provider will recommend vaccines for you based on your age, medical history, and lifestyle or other factors, such as travel or where you work.   What tests do I need? Blood tests  Lipid and cholesterol levels. These may be checked every 5 years, or more often depending on your overall health.  Hepatitis C test.  Hepatitis B test. Screening  Lung cancer screening. You may have this screening every year starting at age 77 if you have a 30-pack-year history of smoking and currently smoke or have quit within  the past 15 years.  Colorectal cancer screening. ? All adults should have this screening starting at age 77 and continuing until age 77. ? Your health care provider may recommend screening at age 77 if you are at increased risk. ? You will have tests every 1-10 years, depending on your results and the type of screening test.  Diabetes screening. ? This is done by checking your blood sugar (glucose) after you have not eaten for a while (fasting). ? You may have this done every 1-3 years.  Mammogram. ? This may be done every 1-2 years. ? Talk with your health care provider about how often you should have regular mammograms.  Abdominal aortic aneurysm (AAA) screening. You may need this if you are a current or former smoker.  BRCA-related cancer screening. This may be done if you have a family history of breast, ovarian, tubal, or peritoneal cancers. Other tests  STD (sexually transmitted disease) testing, if you are at risk.  Bone density scan. This is done to screen for osteoporosis. You may have this done starting at age 77. Talk with your health care provider about your test results, treatment options, and if necessary, the need for more tests. Follow these instructions at home: Eating and drinking  Eat a diet that includes fresh fruits and vegetables, whole grains, lean protein, and low-fat dairy products. Limit your intake of foods with high amounts of sugar, saturated fats, and salt.  Take vitamin and mineral supplements as recommended by your health care provider.  Do not drink alcohol if your health care provider tells you not to drink.  If you drink alcohol: ? Limit how much you have to 0-1 drink a day. ? Be aware of how much alcohol is in your drink. In the [place] one drink equals one 12 oz bottle of beer (355 mL), one 5 oz glass of wine (148 mL), or one 1 oz glass of hard liquor (44 mL).   Lifestyle  Take daily care of your teeth and gums. Brush your teeth every morning  and night with fluoride toothpaste. Floss one time each day.  Stay active. Exercise for at least 30 minutes 5 or more days each week.  Do not use any products that contain nicotine or tobacco, such as cigarettes, e-cigarettes, and chewing tobacco. If you need help quitting, ask your health care provider.  Do not use drugs.  If you are sexually active, practice safe sex. Use a condom or other form of protection in order to prevent STIs (sexually transmitted infections).  Talk with your health care provider about taking a low-dose aspirin or statin.  Find healthy ways to cope with stress, such as: ? Meditation, yoga, or listening to music. ? Journaling. ? Talking to a trusted person. ? Spending time with friends and family. Safety  Always wear your seat belt while driving or riding in a vehicle.  Do not drive: ? If you have been drinking alcohol. Do not ride with someone who has been drinking. ? When you are tired or distracted. ? While texting.  Wear a helmet and other protective equipment during sports activities.  If you have firearms in your house, make sure you follow all gun safety procedures. What's next?  Visit your health care provider once a year for an annual wellness visit.  Ask your health care provider how often you should have your eyes and teeth checked.  Stay up to date on all vaccines. This information is not intended to replace advice given to you by your health care provider. Make sure you discuss any questions you have with your health care provider. Document Revised: 02/29/2020 Document Reviewed: 03/04/2018 Elsevier Patient Education  2021 [employer]  

## 2020-08-21 NOTE — Progress Notes (Signed)
Established Patient Office Visit  Subjective:  Patient ID: Caitlin Hicks, female    DOB: 03-14-1946  Age: 75 y.o. MRN: [id number]  CC:  Chief Complaint  Patient presents with  . Annual Exam    No new concerns     HPI Caitlin Hicks presents for physical exam.  She has history of Wolff-Parkinson-White syndrome, ulcerative colitis, pseudogout, osteoporosis, osteoarthritis.  She had recent left total knee replacement and is recovering well.  Postoperative hemoglobin 9.1.  No dizziness.  Eating well.  Health maintenance reviewed:  -Pneumonia vaccines complete -She receives yearly flu vaccine -DEXA scan 1/21 -Tetanus due 2025 -Declines hepatitis C testing.  Low risk. -Shingles vaccine complete -COVID vaccines complete -Last mammogram was 2009.  She has opted out of having further mammograms.  Social history.  She is retired Orthoptist.  Married and lives with husband.  She has 2 children.  1 son is a Clinical research associate and the other is a Sports administrator out on the [employer].  Never smoked.  Family history-mother had osteoporosis.  Family history otherwise nonrevealing.  Past Medical History:  Diagnosis Date  . Arthritis    "knees, fingers", knees,  . Colitis   . Dysrhythmia    , WPW,SVT  . GERD (gastroesophageal reflux disease)   . Osteoporosis   . Segmental colitis (HCC)   . Ulcerative colitis    age 34  . WPW (Wolff-Parkinson-White syndrome)     Past Surgical History:  Procedure Laterality Date  . BREAST BIOPSY Right 02/22/1995  . CATARACT EXTRACTION W/ INTRAOCULAR LENS  IMPLANT, BILATERAL Bilateral 2-05/2011  . INGUINAL HERNIA REPAIR Right ~ 1981  . ORIF HUMERUS FRACTURE Left 01/11/2013   Procedure: LEFT ORIF PROXIMAL HUMERUS FRACTURE;  Surgeon: Senaida Lange, MD;  Location: [facility];  Service: Orthopedics;  Laterality: Left;  SAME DAY LABS  . ORIF PROXIMAL HUMERUS FRACTURE Left 01/11/2013  . TONSILLECTOMY  1948  . TOTAL KNEE ARTHROPLASTY Left 07/23/2020   Procedure: TOTAL KNEE  ARTHROPLASTY;  Surgeon: Ollen Gross, MD;  Location: [facility];  Service: Orthopedics;  Laterality: Left;   . TUBAL LIGATION  ~ 1985    Family History  Problem Relation Age of Onset  . Osteoporosis Mother   . Prostate cancer Other     Social History   Socioeconomic History  . Marital status: Married    Spouse name: Not on file  . Number of children: Not on file  . Years of education: Not on file  . Highest education level: Not on file  Occupational History  . Not on file  Tobacco Use  . Smoking status: Never Smoker  . Smokeless tobacco: Never Used  Vaping Use  . Vaping Use: Never used  Substance and Sexual Activity  . Alcohol use: Yes    Alcohol/week: 2.0 standard drinks    Types: 2 Glasses of wine per week    Comment: 01/11/2013 "2-3 glasses of wine/wk; some weeks not even that"  . Drug use: No  . Sexual activity: Yes  Other Topics Concern  . Not on file  Social History Narrative   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         Social Determinants of Health   Financial Resource Strain: Low Risk   . Difficulty of Paying Living Expenses: Not hard at all  Food Insecurity: No Food Insecurity  . Worried About Programme researcher, broadcasting/film/video in the Last Year: Never true  . Ran Out of Food in the Last  Year: Never true  Transportation Needs: No Transportation Needs  . Lack of Transportation (Medical): No  . Lack of Transportation (Non-Medical): No  Physical Activity: Sufficiently Active  . Days of Exercise per Week: 7 days  . Minutes of Exercise per Session: 40 min  Stress: No Stress Concern Present  . Feeling of Stress : Not at all  Social Connections: Moderately Integrated  . Frequency of Communication with Friends and Family: More than three times a week  . Frequency of Social Gatherings with Friends and Family: Twice a week  . Attends Religious Services: More than 4 times per year  . Active Member of Clubs or Organizations: No  . Attends Banker Meetings: Never   . Marital Status: Married  Catering manager Violence: Not At Risk  . Fear of Current or Ex-Partner: No  . Emotionally Abused: No  . Physically Abused: No  . Sexually Abused: No    Outpatient Medications Prior to Visit  Medication Sig Dispense Refill  . calcium carbonate (TUMS EX) 750 MG chewable tablet Chew 1 tablet by mouth daily as needed for heartburn.    . colchicine 0.6 MG tablet Take 0.6 mg by mouth daily as needed (gout).    . famotidine-calcium carbonate-magnesium hydroxide (PEPCID COMPLETE) 10-800-165 MG chewable tablet Chew 1 tablet by mouth daily as needed (acid reflux).    . mesalamine (LIALDA) 1.2 G EC tablet Take 2.4 g by mouth 2 (two) times daily. Two tablets in the morning and Two tablets at bedtime    . Methylcellulose, Laxative, (CITRUCEL PO) Take 0.5 tablets by mouth daily as needed (regularity).    . propranolol (INDERAL) 10 MG tablet Take 1-2 tablets at onset of SVT.  May take up to 40 mg (4 tabs) in one hour. (Patient taking differently: Take by mouth See admin instructions. Take 1-2 tablets at onset of SVT.  May take up to 40 mg (4 tabs) in one hour.) 40 tablet 3  . traMADol (ULTRAM) 50 MG tablet Take 1-2 tablets (50-100 mg total) by mouth every 6 (six) hours as needed for moderate pain. 40 tablet 0  . oxyCODONE (OXY IR/ROXICODONE) 5 MG immediate release tablet Take 1-2 tablets (5-10 mg total) by mouth every 6 (six) hours as needed for severe pain. 42 tablet 0  . rivaroxaban (XARELTO) 10 MG TABS tablet Take 1 tablet (10 mg total) by mouth daily with breakfast for 20 days. 20 tablet 0   No facility-administered medications prior to visit.    Allergies  Allergen Reactions  . Other Rash  . Sulfonamide Derivatives Rash  . Sulfa Antibiotics Rash  . Nsaids Other (See Comments)    Contraindicated with lialda  . Penicillins Rash    Tolerated Cephalosporin Date: 07/24/20.      ROS Review of Systems  Constitutional: Negative for appetite change, chills and fever.   Respiratory: Negative for cough and shortness of breath.   Cardiovascular: Negative for chest pain and palpitations.  Gastrointestinal: Negative for abdominal pain.  Genitourinary: Negative for dysuria.  Musculoskeletal: Positive for arthralgias.  Neurological: Negative for dizziness and headaches.      Objective:    Physical Exam Vitals reviewed.  HENT:     Right Ear: Tympanic membrane and ear canal normal.     Left Ear: Tympanic membrane and ear canal normal.  Cardiovascular:     Rate and Rhythm: Normal rate and regular rhythm.  Pulmonary:     Effort: Pulmonary effort is normal.     Breath sounds:  Normal breath sounds.  Abdominal:     Palpations: Abdomen is soft.     Tenderness: There is no abdominal tenderness.  Musculoskeletal:     Comments: She has some mild swelling of her left knee from recent knee replacement surgery.  Neurological:     General: No focal deficit present.  Psychiatric:        Mood and Affect: Mood normal.        Thought Content: Thought content normal.     BP 120/64 (BP Location: Left Arm, Patient Position: Sitting, Cuff Size: Normal)   Pulse 82   Temp 97.7 F (36.5 C) (Oral)   Ht 4' 11.75" (1.518 m)   Wt 98 lb 8 oz (44.7 kg)   SpO2 98%   BMI 19.40 kg/m  Wt Readings from Last 3 Encounters:  08/21/20 98 lb 8 oz (44.7 kg)  07/16/20 103 lb (46.7 kg)  06/04/20 104 lb (47.2 kg)     Health Maintenance Due  Topic Date Due  . Zoster Vaccines- Shingrix (1 of 2) Never done    There are no preventive care reminders to display for this patient.  Lab Results  Component Value Date   TSH 6.49 (H) 05/02/2019   Lab Results  Component Value Date   WBC 9.4 07/24/2020   HGB 9.1 (L) 07/24/2020   HCT 28.2 (L) 07/24/2020   MCV 86.5 07/24/2020   PLT 175 07/24/2020   Lab Results  Component Value Date   NA 136 07/24/2020   K 3.8 07/24/2020   CO2 23 07/24/2020   GLUCOSE 170 (H) 07/24/2020   BUN 15 07/24/2020   CREATININE 0.60 07/24/2020    BILITOT 0.8 07/16/2020   ALKPHOS 80 07/16/2020   AST 18 07/16/2020   ALT 14 07/16/2020   PROT 6.6 07/16/2020   ALBUMIN 4.2 07/16/2020   CALCIUM 8.6 (L) 07/24/2020   ANIONGAP 5 07/24/2020   GFR 74.43 11/06/2017   Lab Results  Component Value Date   CHOL 167 11/06/2017   Lab Results  Component Value Date   HDL 47.40 11/06/2017   Lab Results  Component Value Date   LDLCALC 96 11/06/2017   Lab Results  Component Value Date   TRIG 116.0 11/06/2017   Lab Results  Component Value Date   CHOLHDL 4 11/06/2017   No results found for: HGBA1C    Assessment & Plan:   Problem List Items Addressed This Visit   None   Visit Diagnoses    Physical exam    -  Primary   Relevant Orders   Basic metabolic panel   Lipid panel   CBC with Differential/Platelet   TSH   Hepatic function panel   T4, Free    Generally healthy 75 year old female.  She has history of subclinical hypothyroidism.  Recent left total knee replacement.  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  -Continue annual flu vaccine -COVID vaccines up-to-date -Pneumonia vaccines complete -Tetanus up-to-date -Obtain screening labs including thyroid functions with prior history of subclinical hypothyroidism -Discussed pros and cons of things like mammogram screening and she declines -Has declined further colonoscopy screening -Shingrix vaccine already given -Continue regular weightbearing exercise as tolerated  No orders of the defined types were placed in this encounter.   Follow-up: No follow-ups on file.    Evelena Peat, MD

## 2020-08-24 DIAGNOSIS — M25562 Pain in left knee: Secondary | ICD-10-CM | POA: Diagnosis not present

## 2020-08-28 DIAGNOSIS — Z471 Aftercare following joint replacement surgery: Secondary | ICD-10-CM | POA: Diagnosis not present

## 2020-08-28 DIAGNOSIS — Z96652 Presence of left artificial knee joint: Secondary | ICD-10-CM | POA: Diagnosis not present

## 2020-08-29 ENCOUNTER — Inpatient Hospital Stay (HOSPITAL_COMMUNITY): Admission: RE | Admit: 2020-08-29 | Payer: Medicare PPO | Source: Ambulatory Visit

## 2020-08-29 DIAGNOSIS — M25562 Pain in left knee: Secondary | ICD-10-CM | POA: Diagnosis not present

## 2020-09-03 DIAGNOSIS — M25562 Pain in left knee: Secondary | ICD-10-CM | POA: Diagnosis not present

## 2020-09-06 ENCOUNTER — Encounter (INDEPENDENT_AMBULATORY_CARE_PROVIDER_SITE_OTHER): Payer: Self-pay | Admitting: Ophthalmology

## 2020-09-06 ENCOUNTER — Other Ambulatory Visit: Payer: Self-pay

## 2020-09-06 ENCOUNTER — Ambulatory Visit (INDEPENDENT_AMBULATORY_CARE_PROVIDER_SITE_OTHER): Payer: Medicare PPO | Admitting: Ophthalmology

## 2020-09-06 DIAGNOSIS — H353131 Nonexudative age-related macular degeneration, bilateral, early dry stage: Secondary | ICD-10-CM | POA: Diagnosis not present

## 2020-09-06 DIAGNOSIS — H35412 Lattice degeneration of retina, left eye: Secondary | ICD-10-CM | POA: Diagnosis not present

## 2020-09-06 DIAGNOSIS — H3530 Unspecified macular degeneration: Secondary | ICD-10-CM

## 2020-09-06 DIAGNOSIS — H35372 Puckering of macula, left eye: Secondary | ICD-10-CM

## 2020-09-06 NOTE — Progress Notes (Addendum)
09/06/2020     CHIEF COMPLAINT Patient presents for Retina Follow Up (1 year fu OU OCT/Pt states, "Everything seems to be ok but I can tell that my left eye is not exactly right. It is not as crisp and clear. Stable old floaters."/)   HISTORY OF PRESENT ILLNESS: Caitlin Hicks is a 75 y.o. female who presents to the clinic today for:   HPI     Retina Follow Up           Diagnosis: Other   Laterality: both eyes   Onset: 1 year ago   Severity: mild   Duration: 1 year   Course: stable   Comments: 1 year fu OU OCT Pt states, "Everything seems to be ok but I can tell that my left eye is not exactly right. It is not as crisp and clear. Stable old floaters."          Comments   No interval change no distortion of the vision      Last edited by Edmon Crape, MD on 09/06/2020  1:39 PM.      Referring physician: [name], MD [address] [place],  [place] [address]  HISTORICAL INFORMATION:   Selected notes from the MEDICAL RECORD NUMBER       CURRENT MEDICATIONS: No current outpatient medications on file. (Ophthalmic Drugs)   No current facility-administered medications for this visit. (Ophthalmic Drugs)   Current Outpatient Medications (Other)  Medication Sig   calcium carbonate (TUMS EX) 750 MG chewable tablet Chew 1 tablet by mouth daily as needed for heartburn.   colchicine 0.6 MG tablet Take 0.6 mg by mouth daily as needed (gout).   famotidine-calcium carbonate-magnesium hydroxide (PEPCID COMPLETE) 10-800-165 MG chewable tablet Chew 1 tablet by mouth daily as needed (acid reflux).   mesalamine (LIALDA) 1.2 G EC tablet Take 2.4 g by mouth 2 (two) times daily. Two tablets in the morning and Two tablets at bedtime   Methylcellulose, Laxative, (CITRUCEL PO) Take 0.5 tablets by mouth daily as needed (regularity).   propranolol (INDERAL) 10 MG tablet Take 1-2 tablets at onset of SVT.  May take up to 40 mg (4 tabs) in one hour. (Patient taking  differently: Take by mouth See admin instructions. Take 1-2 tablets at onset of SVT.  May take up to 40 mg (4 tabs) in one hour.)   traMADol (ULTRAM) 50 MG tablet Take 1-2 tablets (50-100 mg total) by mouth every 6 (six) hours as needed for moderate pain.   No current facility-administered medications for this visit. (Other)      REVIEW OF SYSTEMS:    ALLERGIES Allergies  Allergen Reactions   Other Rash   Sulfonamide Derivatives Rash   Sulfa Antibiotics Rash   Nsaids Other (See Comments)    Contraindicated with lialda   Penicillins Rash    Tolerated Cephalosporin Date: 07/24/20.      PAST MEDICAL HISTORY 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HCC)    Ulcerative colitis    age 89   WPW (Wolff-Parkinson-White syndrome)    Past Surgical History:  Procedure Laterality Date   BREAST BIOPSY Right 02/22/1995   CATARACT EXTRACTION W/ INTRAOCULAR LENS  IMPLANT, BILATERAL Bilateral 2-05/2011   INGUINAL HERNIA REPAIR Right ~ 1981  ORIF HUMERUS FRACTURE Left 01/11/2013   Procedure: LEFT ORIF PROXIMAL HUMERUS FRACTURE;  Surgeon: Senaida LangeKevin M Supple, MD;  Location: [facility];  Service: Orthopedics;  Laterality: Left;  SAME DAY LABS   ORIF PROXIMAL HUMERUS FRACTURE Left 01/11/2013   TONSILLECTOMY  1948   TOTAL KNEE ARTHROPLASTY Left 07/23/2020   Procedure: TOTAL KNEE ARTHROPLASTY;  Surgeon: Ollen GrossAluisio, Frank, MD;  Location: [facility];  Service: Orthopedics;  Laterality: Left;  50min   TUBAL LIGATION  ~ 1985    FAMILY HISTORY Family History  Problem Relation Age of Onset   Osteoporosis Mother    Prostate cancer Other     SOCIAL HISTORY Social History   Tobacco Use   Smoking status: Never   Smokeless tobacco: Never  Vaping Use   Vaping Use: Never used  Substance Use Topics   Alcohol use: Yes    Alcohol/week: 2.0 standard  drinks    Types: 2 Glasses of wine per week    Comment: 01/11/2013 "2-3 glasses of wine/wk; some weeks not even that"   Drug use: No         OPHTHALMIC EXAM:  Base Eye Exam     Visual Acuity (ETDRS)       Right Left   Dist [place] 20/25 -1 20/40 -1   Dist ph [place]  20/30 +2         Tonometry (Tonopen, 1:06 PM)       Right Left   Pressure 12 14         Pupils       Pupils Dark Light Shape React APD   Right PERRL 5 4 Round Brisk None   Left PERRL 6 5 Round Brisk None         Visual Fields (Counting fingers)       Left Right    Full Full         Extraocular Movement       Right Left    Full Full         Neuro/Psych     Oriented x3: Yes   Mood/Affect: Normal         Dilation     Both eyes: 1.0% Mydriacyl, 2.5% Phenylephrine @ 1:06 PM           Slit Lamp and Fundus Exam     External Exam       Right Left   External Normal Normal         Slit Lamp Exam       Right Left   Lids/Lashes Normal Normal   Conjunctiva/Sclera White and quiet White and quiet   Cornea Clear Clear   Anterior Chamber Deep and quiet Deep and quiet   Iris Round and reactive Round and reactive   Lens Centered posterior chamber intraocular lens Centered posterior chamber intraocular lens   Anterior Vitreous Normal Normal         Fundus Exam       Right Left   Posterior Vitreous Posterior vitreous detachment Posterior vitreous detachment   Disc Peripapillary atrophy Peripapillary atrophy, Tilted disc   C/D Ratio 0.25 0.2   Macula Normal Normal   Vessels Normal Normal   Periphery No retinal holes or tears. No retinal holes or tears., Lattice degeneration inferotemporal, no retinal breaks            IMAGING AND PROCEDURES  Imaging and Procedures for 09/06/20  OCT, Retina - OU - Both Eyes       Right Eye  Quality was good. Scan locations included subfoveal. Central Foveal Thickness: 325. Progression has been stable. Findings include myopic contour,  normal foveal contour.   Left Eye Quality was good. Scan locations included subfoveal. Central Foveal Thickness: 311. Progression has been stable. Findings include myopic contour, epiretinal membrane, normal foveal contour.   Notes Myopic macular contour OU, no active maculopathy in either eye.  Minimal retinal drusen noted at the layer Bruch's membrane.  Minor epiretinal membrane nasal to the foveal region OS with no topographic distortion or thickening             ASSESSMENT/PLAN:  Myopic macular degeneration of both eyes The nature of dry age related macular degeneration was discussed with the patient as well as its possible conversion to wet. The results of the AREDS 2 study was discussed with the patient. A diet rich in dark leafy green vegetables was advised and specific recommendations were made regarding supplements with AREDS 2 formulation . Control of hypertension and serum cholesterol may slow the disease. Smoking cessation is mandatory to slow the disease and diminish the risk of progressing to wet age related macular degeneration. The patient was instructed in the use of an Amsler Grid and was told to return immediately for any changes in the Grid. Stressed to the patient do not rub eyes      ICD-10-CM   1. Macular pucker, left eye  H35.372 OCT, Retina - OU - Both Eyes    2. Early stage nonexudative age-related macular degeneration of both eyes  H35.3131 OCT, Retina - OU - Both Eyes    3. Lattice degeneration of left retina  H35.412     4. Myopic macular degeneration of both eyes  H35.30       1.  No interval change in myopic macular degeneration.  Very minimal early ARMD OU.  Not a high risk.  Bilateral PVD.  Overall stable  2.  No specific therapy warranted.  3.  Ophthalmic Meds Ordered this visit:  No orders of the defined types were placed in this encounter.      Return in about 2 years (around 09/07/2022) for DILATE OU, COLOR FP, OCT.  There are no  Patient Instructions on file for this visit.   Explained the diagnoses, plan, and follow up with the patient and they expressed understanding.  Patient expressed understanding of the importance of proper follow up care.   Alford Highland Jodye Scali M.D. Diseases & Surgery of the Retina and Vitreous [facility] 09/06/20     Abbreviations: M myopia (nearsighted); A astigmatism; H hyperopia (farsighted); P presbyopia; Mrx spectacle prescription;  CTL contact lenses; OD right eye; OS left eye; OU both eyes  XT exotropia; ET esotropia; PEK punctate epithelial keratitis; PEE punctate epithelial erosions; DES dry eye syndrome; MGD meibomian gland dysfunction; ATs artificial tears; PFAT's preservative free artificial tears; NSC nuclear sclerotic cataract; PSC posterior subcapsular cataract; ERM epi-retinal membrane; PVD posterior vitreous detachment; RD retinal detachment; DM diabetes mellitus; DR diabetic retinopathy; NPDR non-proliferative diabetic retinopathy; PDR proliferative diabetic retinopathy; CSME clinically significant macular edema; DME diabetic macular edema; dbh dot blot hemorrhages; CWS cotton wool spot; POAG primary open angle glaucoma; C/D cup-to-disc ratio; HVF humphrey visual field; GVF goldmann visual field; OCT optical coherence tomography; IOP intraocular pressure; BRVO Branch retinal vein occlusion; CRVO central retinal vein occlusion; CRAO central retinal artery occlusion; BRAO branch retinal artery occlusion; RT retinal tear; SB scleral buckle; PPV pars plana vitrectomy; VH Vitreous hemorrhage; PRP panretinal laser photocoagulation;  IVK intravitreal kenalog; VMT vitreomacular traction; MH Macular hole;  NVD neovascularization of the disc; NVE neovascularization elsewhere; AREDS age related eye disease study; ARMD age related macular degeneration; POAG primary open angle glaucoma; EBMD epithelial/anterior basement membrane dystrophy; ACIOL anterior chamber intraocular lens; IOL  intraocular lens; PCIOL posterior chamber intraocular lens; Phaco/IOL phacoemulsification with intraocular lens placement; PRK photorefractive keratectomy; LASIK laser assisted in situ keratomileusis; HTN hypertension; DM diabetes mellitus; COPD chronic obstructive pulmonary disease

## 2020-09-06 NOTE — Assessment & Plan Note (Signed)

## 2020-09-06 NOTE — Assessment & Plan Note (Signed)
Patient has option to use oral vitamins for ARMD, AREDS 2.

## 2020-10-04 ENCOUNTER — Telehealth: Payer: Self-pay | Admitting: Internal Medicine

## 2020-10-04 ENCOUNTER — Emergency Department (HOSPITAL_COMMUNITY)
Admission: EM | Admit: 2020-10-04 | Discharge: 2020-10-04 | Disposition: A | Discharge status: Home / Self Care | Payer: Medicare PPO | Attending: Emergency Medicine | Admitting: Emergency Medicine

## 2020-10-04 DIAGNOSIS — I471 Supraventricular tachycardia: Secondary | ICD-10-CM

## 2020-10-04 DIAGNOSIS — I498 Other specified cardiac arrhythmias: Secondary | ICD-10-CM

## 2020-10-04 DIAGNOSIS — Z96652 Presence of left artificial knee joint: Secondary | ICD-10-CM | POA: Diagnosis not present

## 2020-10-04 DIAGNOSIS — F32A Depression, unspecified: Secondary | ICD-10-CM | POA: Diagnosis not present

## 2020-10-04 DIAGNOSIS — R410 Disorientation, unspecified: Secondary | ICD-10-CM | POA: Diagnosis not present

## 2020-10-04 DIAGNOSIS — I499 Cardiac arrhythmia, unspecified: Secondary | ICD-10-CM | POA: Diagnosis not present

## 2020-10-04 DIAGNOSIS — Z20822 Contact with and (suspected) exposure to covid-19: Secondary | ICD-10-CM | POA: Insufficient documentation

## 2020-10-04 DIAGNOSIS — I456 Pre-excitation syndrome: Secondary | ICD-10-CM | POA: Diagnosis not present

## 2020-10-04 DIAGNOSIS — R000 Tachycardia, unspecified: Secondary | ICD-10-CM | POA: Diagnosis not present

## 2020-10-04 LAB — HEMOGLOBIN A1C
Hgb A1c MFr Bld: 5.5 % (ref 4.8–5.6)
Mean Plasma Glucose: 111.15 mg/dL

## 2020-10-04 LAB — BASIC METABOLIC PANEL
Anion gap: 11 (ref 5–15)
BUN: 18 mg/dL (ref 8–23)
CO2: 16 mmol/L — ABNORMAL LOW (ref 22–32)
Calcium: 9.1 mg/dL (ref 8.9–10.3)
Chloride: 107 mmol/L (ref 98–111)
Creatinine, Ser: 0.77 mg/dL (ref 0.44–1.00)
GFR, Estimated: >60 mL/min (ref >60)
Glucose, Bld: 150 mg/dL — ABNORMAL HIGH (ref 70–99)
Potassium: 5.3 mmol/L — ABNORMAL HIGH (ref 3.5–5.1)
Sodium: 134 mmol/L — ABNORMAL LOW (ref 135–145)

## 2020-10-04 LAB — I-STAT VENOUS BLOOD GAS, ED
Acid-base deficit: 5 mmol/L — ABNORMAL HIGH (ref 0.0–2.0)
Bicarbonate: 18.1 mmol/L — ABNORMAL LOW (ref 20.0–28.0)
Calcium, Ion: 1.01 mmol/L — ABNORMAL LOW (ref 1.15–1.40)
HCT: 41 % (ref 36.0–46.0)
Hemoglobin: 13.9 g/dL (ref 12.0–15.0)
O2 Saturation: 91 %
Potassium: 4.6 mmol/L (ref 3.5–5.1)
Sodium: 133 mmol/L — ABNORMAL LOW (ref 135–145)
TCO2: 19 mmol/L — ABNORMAL LOW (ref 22–32)
pCO2, Ven: 28.1 mmHg — ABNORMAL LOW (ref 44.0–60.0)
pH, Ven: 7.416 (ref 7.250–7.430)
pO2, Ven: 59 mmHg — ABNORMAL HIGH (ref 32.0–45.0)

## 2020-10-04 LAB — CBC WITH DIFFERENTIAL/PLATELET
Abs Immature Granulocytes: 0.04 10*3/uL (ref 0.00–0.07)
Basophils Absolute: 0.1 10*3/uL (ref 0.0–0.1)
Basophils Relative: 1 %
Eosinophils Absolute: 0.1 10*3/uL (ref 0.0–0.5)
Eosinophils Relative: 1 %
HCT: 44.8 % (ref 36.0–46.0)
Hemoglobin: 13.5 g/dL (ref 12.0–15.0)
Immature Granulocytes: 0 %
Lymphocytes Relative: 16 %
Lymphs Abs: 1.6 10*3/uL (ref 0.7–4.0)
MCH: 26.9 pg (ref 26.0–34.0)
MCHC: 30.1 g/dL (ref 30.0–36.0)
MCV: 89.4 fL (ref 80.0–100.0)
Monocytes Absolute: 0.6 10*3/uL (ref 0.1–1.0)
Monocytes Relative: 6 %
Neutro Abs: 7.6 10*3/uL (ref 1.7–7.7)
Neutrophils Relative %: 76 %
Platelets: 271 10*3/uL (ref 150–400)
RBC: 5.01 MIL/uL (ref 3.87–5.11)
RDW: 15 % (ref 11.5–15.5)
WBC: 10 10*3/uL (ref 4.0–10.5)
nRBC: 0 % (ref 0.0–0.2)

## 2020-10-04 LAB — MAGNESIUM: Magnesium: 2 mg/dL (ref 1.7–2.4)

## 2020-10-04 LAB — SARS CORONAVIRUS 2 (TAT 6-24 HRS): SARS Coronavirus 2: NEGATIVE

## 2020-10-04 LAB — CBG MONITORING, ED: Glucose-Capillary: 163 mg/dL — ABNORMAL HIGH (ref 70–99)

## 2020-10-04 MED ORDER — SODIUM CHLORIDE 0.9 % IV BOLUS
1000.0000 mL | Freq: Once | INTRAVENOUS | Status: AC
  Administered 2020-10-04: 1000 mL via INTRAVENOUS

## 2020-10-04 MED ORDER — ADENOSINE 6 MG/2ML IV SOLN
6.0000 mg | Freq: Once | INTRAVENOUS | Status: AC
  Administered 2020-10-04: 6 mg via INTRAVENOUS
  Filled 2020-10-04 (×2): qty 2

## 2020-10-04 NOTE — Discharge Instructions (Addendum)
Please follow-up with your cardiologist.  If you have any recurrent episodes of fluttering sensation, come back to ER.  Your potassium level was borderline elevated today.  Please have your blood work rechecked by either cardiology or your primary doctor within the next week or so.  Your A1c was 5.5.  Please discuss with primary doctor.

## 2020-10-04 NOTE — Telephone Encounter (Signed)
Took direct call to triage from scheduling staff.  Pt has h/o SVT.  Pt states current episode of SVT started at 8:45 am with HR of 180.  At that time she took 20 mg's of propranolol.  Episode continued-took additional propranolol 10 mg at 9:15 am.  Pt took 4th tablet of propranolol at 10:15 am.  Pt states she continues to be in SVT.  She has some SOB.  She has tried valsalva maneuvers.  Pt has long history of SVT and is knowledgable in how to treat.  Pt question is if she can take more propranolol.  Will contact Dr. Taylor/covering to see if Pt can take additional propranolol.  Pt states her HR continues to be 120's.  --spoke with Dr. Ladona Ridgel.  Advised Pt can take 20 mg more of propranolol.  If she does not convert in a few hours she should call EMS so they can administer adenosine.  Pt aware.  Will call Pt at 2:30 pm to assess.

## 2020-10-04 NOTE — ED Provider Notes (Signed)
[facility] EMERGENCY DEPARTMENT Provider Note   CSN: [id number] Arrival date & time: 10/04/20  1600     History Chief Complaint  Patient presents with   Tachycardia    SPRUHA WEIGHT is a 75 y.o. female.  History of WPW.  Thinks that she is in SVT right now.  Started having palpitations earlier today.  Tried vagal maneuvers, taking propanolol at home, no improvement.  Otherwise has no symptoms.  Besides palpitations, no chest pain.  No difficulty in breathing.  Noted her blood sugar earlier today seem to be elevated.  Does not have history of diabetes.  Completed chart review, reviewed past cardiology notes. HPI     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HCC)    Ulcerative colitis    age 7   WPW (Wolff-Parkinson-White syndrome)     Patient Active Problem List   Diagnosis Date Noted   Primary osteoarthritis of left knee 07/23/2020   Macular pucker, left eye 09/06/2019   Myopic macular degeneration of both eyes 09/06/2019   Early stage nonexudative age-related macular degeneration of both eyes 09/06/2019   Lattice degeneration of left retina 09/06/2019   Cough, persistent 04/28/2018   Globus pharyngeus 04/28/2018   Pseudogout 06/17/2016   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   Injury to peroneal nerve 09/11/2015   Tendinopathy of right gluteus medius 10/19/2014   OA (osteoarthritis) of knee 08/30/2014   Right shoulder pain 06/14/2014   Osteoporosis 05/24/2014   Hallux rigidus of right foot 05/23/2014   Fracture of humerus, proximal, left, closed 01/12/2013   WPW (Wolff-Parkinson-White syndrome) 11/24/2012   Acute pain of left knee 08/19/2011   Ulcerative colitis (HCC) 04/10/2011   H/O calcium pyrophosphate deposition disease (CPPD) 04/22/2010   KNEE PAIN, RIGHT 04/22/2010    PATELLAR DISLOCATION, RIGHT 04/22/2010   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   ABNORMAL ELECTROCARDIOGRAM 01/17/2009    Past Surgical History:  Procedure Laterality Date   BREAST BIOPSY Right 02/22/1995   CATARACT EXTRACTION W/ INTRAOCULAR LENS  IMPLANT, BILATERAL Bilateral 2-05/2011   INGUINAL HERNIA REPAIR Right ~ 1981   ORIF HUMERUS FRACTURE Left 01/11/2013   Procedure: LEFT ORIF PROXIMAL HUMERUS FRACTURE;  Surgeon: Senaida Lange, MD;  Location: [facility] OR;  Service: Orthopedics;  Laterality: Left;  SAME DAY LABS   ORIF PROXIMAL HUMERUS FRACTURE Left 01/11/2013   TONSILLECTOMY  1948   TOTAL KNEE ARTHROPLASTY Left 07/23/2020   Procedure: TOTAL KNEE ARTHROPLASTY;  Surgeon: Ollen Gross, MD;  Location: [facility];  Service: Orthopedics;  Laterality: Left;    TUBAL LIGATION  ~ 1985     OB History   No obstetric history on file.     Family History  Problem Relation Age of Onset   Osteoporosis Mother    Prostate cancer Other     Social History   Tobacco Use   Smoking status: Never   Smokeless tobacco: Never  Vaping Use   Vaping Use: Never used  Substance Use Topics   Alcohol use: Yes    Alcohol/week: 2.0 standard drinks    Types: 2 Glasses of wine per week    Comment: 01/11/2013 "2-3 glasses of wine/wk; some weeks not even that"   Drug use: No    Home Medications Prior to Admission medications   Medication Sig  Start Date End Date Taking? Authorizing Provider  calcium carbonate (TUMS EX) 750 MG chewable tablet Chew 1 tablet by mouth daily as needed for heartburn.    [provider]  colchicine 0.6 MG tablet Take 0.6 mg by mouth daily as needed (gout).    [provider]  famotidine-calcium carbonate-magnesium hydroxide (PEPCID COMPLETE) [phone] MG chewable tablet Chew 1 tablet by mouth daily as needed (acid reflux).    [provider]  mesalamine (LIALDA) 1.2 G EC tablet Take 2.4 g by mouth 2 (two) times daily. Two tablets in the  morning and Two tablets at bedtime    [provider]  Methylcellulose, Laxative, (CITRUCEL PO) Take 0.5 tablets by mouth daily as needed (regularity).    [provider]  propranolol (INDERAL) 10 MG tablet Take 1-2 tablets at onset of SVT.  May take up to 40 mg (4 tabs) in one hour. Patient taking differently: Take by mouth See admin instructions. Take 1-2 tablets at onset of SVT.  May take up to 40 mg (4 tabs) in one hour. 05/17/19   Marinus Maw, MD  traMADol (ULTRAM) 50 MG tablet Take 1-2 tablets (50-100 mg total) by mouth every 6 (six) hours as needed for moderate pain. 07/24/20   Edmisten, Kristie L, PA    Allergies    Other, Sulfonamide derivatives, Sulfa antibiotics, Nsaids, and Penicillins  Review of Systems   Review of Systems  Constitutional:  Negative for chills and fever.  HENT:  Negative for ear pain and sore throat.   Eyes:  Negative for pain and visual disturbance.  Respiratory:  Negative for cough and shortness of breath.   Cardiovascular:  Positive for palpitations. Negative for chest pain.  Gastrointestinal:  Negative for abdominal pain and vomiting.  Genitourinary:  Negative for dysuria and hematuria.  Musculoskeletal:  Negative for arthralgias and back pain.  Skin:  Negative for color change and rash.  Neurological:  Negative for seizures and syncope.  All other systems reviewed and are negative.  Physical Exam Updated Vital Signs BP 112/68   Pulse 62   Temp 97.8 F (36.6 C) (Oral)   Resp 14   SpO2 100%   Physical Exam Vitals and nursing note reviewed.  Constitutional:      General: She is not in acute distress.    Appearance: She is well-developed.  HENT:     Head: Normocephalic and atraumatic.  Eyes:     Conjunctiva/sclera: Conjunctivae normal.  Cardiovascular:     Rate and Rhythm: Regular rhythm. Tachycardia present.     Pulses: Normal pulses.     Heart sounds: No murmur heard. Pulmonary:     Effort: Pulmonary effort is normal.  No respiratory distress.     Breath sounds: Normal breath sounds.  Abdominal:     Palpations: Abdomen is soft.     Tenderness: There is no abdominal tenderness.  Musculoskeletal:     Cervical back: Neck supple.  Skin:    General: Skin is warm and dry.  Neurological:     General: No focal deficit present.     Mental Status: She is alert.  Psychiatric:        Mood and Affect: Mood normal.        Behavior: Behavior normal.    ED Results / Procedures / Treatments   Labs (all labs ordered are listed, but only abnormal results are displayed) Labs Reviewed  BASIC METABOLIC PANEL - Abnormal; Notable for the following components:  Result Value   Sodium 134 (*)    Potassium 5.3 (*)    CO2 16 (*)    Glucose, Bld 150 (*)    All other components within normal limits  I-STAT VENOUS BLOOD GAS, ED - Abnormal; Notable for the following components:   pCO2, Ven 28.1 (*)    pO2, Ven 59.0 (*)    Bicarbonate 18.1 (*)    TCO2 19 (*)    Acid-base deficit 5.0 (*)    Sodium 133 (*)    Calcium, Ion 1.01 (*)    All other components within normal limits  CBG MONITORING, ED - Abnormal; Notable for the following components:   Glucose-Capillary 163 (*)    All other components within normal limits  SARS CORONAVIRUS 2 (TAT 6-24 HRS)  CBC WITH DIFFERENTIAL/PLATELET  MAGNESIUM  HEMOGLOBIN A1C    EKG EKG Interpretation  Date/Time:  Thursday October 04 2020 17:34:36 EDT Ventricular Rate:  63 PR Interval:  118 QRS Duration: 106 QT Interval:  428 QTC Calculation: 439 R Axis:   55 Text Interpretation: Sinus rhythm Ventricular preexcitation(WPW) Confirmed by Marianna Fuss ([id number]) on 10/05/2020 12:04:55 AM  Radiology No results found.  Procedures .Cardioversion  Date/Time: 10/05/2020 12:05 AM Performed by: Milagros Loll, MD Authorized by: Milagros Loll, MD   Consent:    Consent obtained:  Verbal   Consent given by:  Patient   Risks discussed:  Induced arrhythmia and  pain Universal protocol:    Immediately prior to procedure a time out was called: yes     Patient identity confirmed:  Verbally with patient Pre-procedure details:    Rhythm:  Supraventricular tachycardia Patient sedated: No Attempt one:    Cardioversion mode attempt one: 6mg  Adenosine.   Shock outcome:  Conversion to normal sinus rhythm Post-procedure details:    Patient status:  Awake   Patient tolerance of procedure:  Tolerated well, no immediate complications Comments:     Dr. with EP at bedside for cardioversion attempt with adenosine.  Patient was placed on cardiac monitor, defibrillator pads placed.  Initial rhythm of SVT.  6 mg adenosine dose given pushed fast.  Successful conversion to a normal sinus rhythm.  Patient tolerated well no immediate complications.  .Critical Care  Date/Time: 10/05/2020 12:10 AM Performed by: 10/07/2020, MD Authorized by: Milagros Loll, MD   Critical care provider statement:    Critical care time (minutes):  35   Critical care was necessary to treat or prevent imminent or life-threatening deterioration of the following conditions: SVT.   Critical care was time spent personally by me on the following activities:  Discussions with consultants, evaluation of patient's response to treatment, examination of patient, ordering and performing treatments and interventions, ordering and review of laboratory studies, ordering and review of radiographic studies, pulse oximetry, re-evaluation of patient's condition, obtaining history from patient or surrogate and review of old charts   Medications Ordered in ED Medications  adenosine (ADENOCARD) 6 MG/2ML injection 6 mg (6 mg Intravenous Given 10/04/20 1710)  sodium chloride 0.9 % bolus 1,000 mL (0 mLs Intravenous Stopped 10/04/20 1856)    ED Course  I have reviewed the triage vital signs and the nursing notes.  Pertinent labs & imaging results that were available during my care of the  patient were reviewed by me and considered in my medical decision making (see chart for details).    MDM Rules/Calculators/A&P  75 year old female presents to ER with concern for palpitations, found to be in SVT.  Has history of WPW.  Consulted EP, Dr. Lalla BrothersLambert came to bedside.  He recommended trial of adenosine.  Based on the patient's current rhythm, he stated that adenosine would be appropriate in this specific setting.  She was given 1 dose of 6 mg adenosine and successfully converted to a normal sinus rhythm.  Patient observed in ER and remained in sinus.  Basic labs stable.  Noted borderline hyperkalemia, recommended patient have electrolytes rechecked next week.  Recommended patient follow-up with cardiology to discuss long-term management, potential ablation.    After the discussed management above, the patient was determined to be safe for discharge.  The patient was in agreement with this plan and all questions regarding their care were answered.  ED return precautions were discussed and the patient will return to the ED with any significant worsening of condition.  Final Clinical Impression(s) / ED Diagnoses Final diagnoses:  SVT (supraventricular tachycardia) (HCC)  WPW (Wolff-Parkinson-White syndrome)    Rx / DC Orders ED Discharge Orders     None        Milagros Lollykstra, Brittin Belnap S, MD 10/05/20 0010

## 2020-10-04 NOTE — Consult Note (Addendum)
Electrophysiology Consultation:   Patient ID: Caitlin Hicks MRN: [id number]; DOB: [birth year]  Admit date: 10/04/2020 Date of Consult: 10/04/2020  PCP:  Caitlin Covey, MD   [facility] Providers Cardiologist:  Caitlin Bunting, MD        Patient Profile:   Caitlin Hicks is a 75 y.o. female with a hx of WPW, ulcerative colitis, GERD who is being seen 10/04/2020 for the evaluation of tachycardia at the request of Dr. Stevie Hicks.  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  Patient tells me that typically her tachycardia episodes are around 180 bpm on her apple watch.  Today her heart rates have been a little bit slower in the 150s.  She did take her beta-blocker so this may be the reason for the lower heart rate.  She has been well recently.  No presyncope or syncope.  She says the palpitations are uncomfortable.  No history of atrial fibrillation or flutter.   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HCC)    Ulcerative colitis    age 52   WPW (Wolff-Parkinson-White syndrome)     Past Surgical History:  Procedure Laterality Date   BREAST BIOPSY Right 02/22/1995   CATARACT EXTRACTION W/ INTRAOCULAR LENS  IMPLANT, BILATERAL Bilateral 2-05/2011   INGUINAL HERNIA REPAIR Right ~ 1981   ORIF HUMERUS FRACTURE Left 01/11/2013   Procedure: LEFT ORIF PROXIMAL HUMERUS FRACTURE;  Surgeon: Senaida Lange, MD;  Location: [facility] OR;  Service: Orthopedics;  Laterality: Left;  SAME DAY LABS   ORIF PROXIMAL HUMERUS FRACTURE Left 01/11/2013   TONSILLECTOMY  1948   TOTAL KNEE ARTHROPLASTY Left 07/23/2020   Procedure: TOTAL KNEE ARTHROPLASTY;  Surgeon: Ollen Gross, MD;  Location: [facility];  Service: Orthopedics;  Laterality: Left;    TUBAL LIGATION   ~ 1985     Home Medications:  Prior to Admission medications   Medication Sig Start Date End Date Taking? Authorizing Provider  calcium carbonate (TUMS EX) 750 MG chewable tablet Chew 1 tablet by mouth daily as needed for heartburn.    [provider]  colchicine 0.6 MG tablet Take 0.6 mg by mouth daily as needed (gout).    [provider]  famotidine-calcium carbonate-magnesium hydroxide (PEPCID COMPLETE) 10-800-165 MG chewable tablet Chew 1 tablet by mouth daily as needed (acid reflux).    [provider]  mesalamine (LIALDA) 1.2 G EC tablet Take 2.4 g by mouth 2 (two) times daily. Two tablets in the morning and Two tablets at bedtime    [provider]  Methylcellulose, Laxative, (CITRUCEL PO) Take 0.5 tablets by mouth daily as needed (regularity).    [provider]  propranolol (INDERAL) 10 MG tablet Take 1-2 tablets at onset of SVT.  May take up to 40 mg (4 tabs) in one hour. Patient taking differently: Take by mouth See admin instructions. Take 1-2 tablets at onset of SVT.  May take up to 40 mg (4 tabs) in one hour. 05/17/19   Marinus Maw, MD  traMADol (ULTRAM) 50 MG tablet Take 1-2 tablets (50-100 mg total) by mouth every 6 (six) hours as needed for moderate pain. 07/24/20   Edmisten, Lyn Hollingshead, PA  Inpatient Medications: Scheduled Meds:  Continuous Infusions:  PRN Meds:   Allergies:    Allergies  Allergen Reactions   Other Rash   Sulfonamide Derivatives Rash   Sulfa Antibiotics Rash   Nsaids Other (See Comments)    Contraindicated with lialda   Penicillins Rash    Tolerated Cephalosporin Date: 07/24/20.      Social History:   Social History   Socioeconomic History   Marital status: Married    Spouse name: Not on file   Number of children: Not on file   Years of education: Not on file   Highest education level: Not on file  Occupational History   Not on file  Tobacco Use   Smoking status: Never   Smokeless  tobacco: Never  Vaping Use   Vaping Use: Never used  Substance and Sexual Activity   Alcohol use: Yes    Alcohol/week: 2.0 standard drinks    Types: 2 Glasses of wine per week    Comment: 01/11/2013 "2-3 glasses of wine/wk; some weeks not even that"   Drug use: No   Sexual activity: Yes  Other Topics Concern   Not on file  Social History Narrative   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         Social Determinants of Health   Financial Resource Strain: Low Risk    Difficulty of Paying Living Expenses: Not hard at all  Food Insecurity: No Food Insecurity   Worried About Programme researcher, broadcasting/film/video in the Last Year: Never true   Ran Out of Food in the Last Year: Never true  Transportation Needs: No Transportation Needs   Lack of Transportation (Medical): No   Lack of Transportation (Non-Medical): No  Physical Activity: Sufficiently Active   Days of Exercise per Week: 7 days   Minutes of Exercise per Session: 40 min  Stress: No Stress Concern Present   Feeling of Stress : Not at all  Social Connections: Moderately Integrated   Frequency of Communication with Friends and Family: More than three times a week   Frequency of Social Gatherings with Friends and Family: Twice a week   Attends Religious Services: More than 4 times per year   Active Member of [employer] or Organizations: No   Attends Engineer, structural: Never   Marital Status: Married  Catering manager Violence: Not At Risk   Fear of Current or Ex-Partner: No   Emotionally Abused: No   Physically Abused: No   Sexually Abused: No    Family History:    Family History  Problem Relation Age of Onset   Osteoporosis Mother    Prostate cancer Other      ROS:  Please see the history of present illness.   All other ROS reviewed and negative.     Physical Exam/Data:   Vitals:   10/04/20 1606  BP: (!) 139/96  Pulse: (!) 151  Resp: 18  Temp: 97.6 F (36.4 C)  TempSrc: Oral  SpO2: 98%   No intake or output data  in the 24 hours ending 10/04/20 1629 Last 3 Weights 08/21/2020 07/16/2020 06/04/2020  Weight (lbs) 98 lb 8 oz 103 lb 104 lb  Weight (kg) 44.679 kg 46.72 kg 47.174 kg     There is no height or weight on file to calculate BMI.   General:  Well nourished, well developed, in no acute distress HEENT: normal Lymph: no adenopathy Neck: no JVD Endocrine:  No thryomegaly Vascular: No carotid bruits; FA pulses  2+ bilaterally without bruits  Cardiac:  normal S1, S2; regular rhythm, tachycardic; no murmur  Lungs:  clear to auscultation bilaterally, no wheezing, rhonchi or rales  Abd: soft, nontender, no hepatomegaly  Ext: no edema Musculoskeletal:  No deformities, BUE and BLE strength normal and equal Skin: warm and dry  Neuro:  CNs 2-12 intact, no focal abnormalities noted Psych:  Normal affect   EKG:  The EKG was personally reviewed and demonstrates: Narrow complex tachycardia with a ventricular rate in the 150s.  No evidence of preexcitation on the tachycardic ECG.  Prior EKGs of sinus rhythm reviewed and show preexcitation.   Telemetry:  Telemetry was personally reviewed and demonstrates: Tachycardia, narrow complex, regular  Relevant CV Studies: N/A  Laboratory Data:  High Sensitivity Troponin:  No results for input(s): TROPONINIHS in the last 720 hours.   ChemistryNo results for input(s): NA, K, CL, CO2, GLUCOSE, BUN, CREATININE, CALCIUM, GFRNONAA, GFRAA, ANIONGAP in the last 168 hours.  No results for input(s): PROT, ALBUMIN, AST, ALT, ALKPHOS, BILITOT in the last 168 hours. HematologyNo results for input(s): WBC, RBC, HGB, HCT, MCV, MCH, MCHC, RDW, PLT in the last 168 hours. BNPNo results for input(s): BNP, PROBNP in the last 168 hours.  DDimer No results for input(s): DDIMER in the last 168 hours.   Radiology/Studies:  No results found.   Assessment and Plan:   Wolff-Parkinson-White syndrome Patient with evidence of orthodromic AVRT.  Sinus ECG shows obvious preexcitation  and today's tachycardia was readily treated with 6 mg of adenosine.  I discussed the management options with the patient for her symptomatic Wolff-Parkinson-White syndrome including medical management and EP study with ablation.  I have recommended that she undergo an EP study with ablation for definitive management of her tachyarrhythmia given today's prolonged episode requiring hospitalization.  The patient is a good candidate for EP study and ablation.  We had a long discussion about the risks, recovery time with EP study and ablation and she would like to discuss this more in clinic to get it scheduled.  If she were to have recurrent episodes of SVT prior to scheduling an EP study and ablation, would favor initiation of an antiarrhythmic to suppress the arrhythmia.  Given the patient converted to sinus rhythm with 6 mg of IV adenosine, I think it is reasonable for her to be discharged from the hospital today.  She will keep a close eye on her heart rhythm using her watch and will continue to use as needed propranolol if she has recurrence of the arrhythmia.  If her arrhythmia does not break with propranolol or vagal maneuvers at home, she is to report to the emergency department.   For questions or updates, please contact [facility] Please consult [email or web address] for contact info under    Signed, Lanier Prude, MD  10/04/2020 4:29 PM    Total time of encounter: 120 minutes total time of encounter, including face-to-face patient care, coordination of care and counseling regarding high complexity medical decision making re: supraventricular tachycardia. At least 60 minutes of this time was critical care time at the bedside treating the tachycardia with IV adenosine.

## 2020-10-04 NOTE — Telephone Encounter (Signed)
Call placed to Pt.  She is still in SVT.  She will call 911 as advised by Dr. Ladona Ridgel for adenosine.  F/u appt made with Dr. Ladona Ridgel.  Pt thanked nurse for assistance.

## 2020-10-04 NOTE — ED Triage Notes (Signed)
Pt bib Gems from home d/t SVT. 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 Per ems , Pt took a total of 60mg  of propanolol before arriving to the hospital; however did not help to decrease the HR. EMS given 500 ml fluids d/t pt's cbg being 227.   EMS vitals:  125/104 HR 150  100%  RA

## 2020-10-04 NOTE — Telephone Encounter (Signed)
Patient c/o Palpitations:  High priority if patient c/o lightheadedness, shortness of breath, or chest pain  How long have you had palpitations/irregular HR/ Afib? Are you having the symptoms now?  Patient states she has been having SVT's for the past 3 hours. She states she took Inderal, but she's still having symptoms. She would like to know if she needs to take more.   Are you currently experiencing lightheadedness, SOB or CP?  Continuous lightheadedness  Do you have a history of afib (atrial fibrillation) or irregular heart rhythm? Patient has Wolff-parkinson-White Syndrome    Have you checked your BP or HR? (document readings if available):  HR- 180  Are you experiencing any other symptoms?  No   STAT if patient feels like he/she is going to faint   Are you dizzy now?  Yes   Do you feel faint or have you passed out?  No   Do you have any other symptoms?  No   Have you checked your HR and BP (record if available)?  HR- 180

## 2020-10-05 ENCOUNTER — Encounter: Payer: Self-pay | Admitting: Family Medicine

## 2020-10-05 ENCOUNTER — Telehealth: Payer: Self-pay | Admitting: Internal Medicine

## 2020-10-05 NOTE — Telephone Encounter (Signed)
Caitlin Hicks is calling requesting a Provider Switch from Dr. Ladona Ridgel to Dr. Lalla Brothers due to seeing Dr. Lalla Brothers in the hospital yesterday and really liking him. Please advise.

## 2020-10-06 NOTE — Telephone Encounter (Signed)
That's fine

## 2020-10-10 ENCOUNTER — Ambulatory Visit: Payer: Medicare PPO | Admitting: Family Medicine

## 2020-10-10 ENCOUNTER — Encounter: Payer: Self-pay | Admitting: Family Medicine

## 2020-10-10 ENCOUNTER — Other Ambulatory Visit: Payer: Self-pay

## 2020-10-10 VITALS — BP 118/68 | HR 68 | Temp 97.8°F | Wt 101.3 lb

## 2020-10-10 DIAGNOSIS — R5383 Other fatigue: Secondary | ICD-10-CM

## 2020-10-10 DIAGNOSIS — I456 Pre-excitation syndrome: Secondary | ICD-10-CM | POA: Diagnosis not present

## 2020-10-10 DIAGNOSIS — E875 Hyperkalemia: Secondary | ICD-10-CM

## 2020-10-10 LAB — BASIC METABOLIC PANEL
BUN: 18 mg/dL (ref 6–23)
CO2: 24 mEq/L (ref 19–32)
Calcium: 9.7 mg/dL (ref 8.4–10.5)
Chloride: 103 mEq/L (ref 96–112)
Creatinine, Ser: 0.73 mg/dL (ref 0.40–1.20)
GFR: 79.35 mL/min (ref >60.00)
Glucose, Bld: 93 mg/dL (ref 70–99)
Potassium: 4.2 mEq/L (ref 3.5–5.1)
Sodium: 137 mEq/L (ref 135–145)

## 2020-10-10 LAB — VITAMIN D 25 HYDROXY (VIT D DEFICIENCY, FRACTURES): VITD: 40.72 ng/mL (ref 30.00–100.00)

## 2020-10-10 MED ORDER — PROPRANOLOL HCL 10 MG PO TABS: ORAL_TABLET | ORAL | 3 refills | Status: DC

## 2020-10-10 NOTE — Progress Notes (Signed)
Established Patient Office Visit  Subjective:  Patient ID: Caitlin Hicks, female    DOB: 14-Oct-1945  Age: 75 y.o. MRN: [id number]  CC:  Chief Complaint  Patient presents with   Hospitalization Follow-up    HPI Caitlin Hicks presents for follow-up from recent ER visit.  She has history of known Wolff-Parkinson-White syndrome.  She had onset around 8:45 AM on the 14th of this month of tachyarrhythmia.  Her heart rate was in the 150s.  She took a total of six 10 mg propranolol over 3 hours without resolution.  She normally is able to get control of her SVT with propranolol.  She also has tried vagal maneuvers in the past.  No history of A. fib or a flutter.  She had no chest pain. She ended up receiving adenosine with prompt conversion back to sinus rhythm.  She had no evidence for recurrence since then.  She is planning to follow-up with Dr. Lalla Brothers in cardiology.  In the past she had declined consideration for ablation therapy but she may be willing to consider at this time.  Recent labs reviewed.  She had sodium 134 and potassium 5.3.  Does not take any potassium replacement.  No ACE or ARB use.  We explained her mildly elevated potassium was probably incidental.  CBC was normal.  Patient requesting vitamin D level.  She has some generalized fatigue and weakness.  She does take vitamin D 1000 international units daily.  She actually has concerns whether this may be too high rather than too low based on some readings she had done recently  She is generally very healthy.  She had recent total knee replacement and is recovering well from that.  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HCC)    Ulcerative colitis    age 38   WPW (Wolff-Parkinson-White syndrome)     Past Surgical History:  Procedure Laterality Date   BREAST BIOPSY  Right 02/22/1995   CATARACT EXTRACTION W/ INTRAOCULAR LENS  IMPLANT, BILATERAL Bilateral 2-05/2011   INGUINAL HERNIA REPAIR Right ~ 1981   ORIF HUMERUS FRACTURE Left 01/11/2013   Procedure: LEFT ORIF PROXIMAL HUMERUS FRACTURE;  Surgeon: Senaida Lange, MD;  Location: [facility];  Service: Orthopedics;  Laterality: Left;  SAME DAY LABS   ORIF PROXIMAL HUMERUS FRACTURE Left 01/11/2013   TONSILLECTOMY  1948   TOTAL KNEE ARTHROPLASTY Left 07/23/2020   Procedure: TOTAL KNEE ARTHROPLASTY;  Surgeon: Ollen Gross, MD;  Location: [facility];  Service: Orthopedics;  Laterality: Left;    TUBAL LIGATION  ~ 1985    Family History  Problem Relation Age of Onset   Osteoporosis Mother    Prostate cancer Other     Social History   Socioeconomic History   Marital status: Married    Spouse name: Not on file   Number of children: Not on file   Years of education: Not on file   Highest education level: Not on file  Occupational History   Not on file  Tobacco Use   Smoking status: Never   Smokeless tobacco: Never  Vaping Use   Vaping Use: Never used  Substance and Sexual Activity   Alcohol use: Yes    Alcohol/week: 2.0 standard drinks    Types: 2 Glasses of wine per  week    Comment: 01/11/2013 "2-3 glasses of wine/wk; some weeks not even that"   Drug use: No   Sexual activity: Yes  Other Topics Concern   Not on file  Social History Narrative   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         Social Determinants of Health   Financial Resource Strain: Low Risk    Difficulty of Paying Living Expenses: Not hard at all  Food Insecurity: No Food Insecurity   Worried About Programme researcher, broadcasting/film/video in the Last Year: Never true   Ran Out of Food in the Last Year: Never true  Transportation Needs: No Transportation Needs   Lack of Transportation (Medical): No   Lack of Transportation (Non-Medical): No  Physical Activity: Sufficiently Active   Days of Exercise per Week: 7 days   Minutes of Exercise per  Session: 40 min  Stress: No Stress Concern Present   Feeling of Stress : Not at all  Social Connections: Moderately Integrated   Frequency of Communication with Friends and Family: More than three times a week   Frequency of Social Gatherings with Friends and Family: Twice a week   Attends Religious Services: More than 4 times per year   Active Member of [employer] or Organizations: No   Attends Banker Meetings: Never   Marital Status: Married  Catering manager Violence: Not At Risk   Fear of Current or Ex-Partner: No   Emotionally Abused: No   Physically Abused: No   Sexually Abused: No    Outpatient Medications Prior to Visit  Medication Sig Dispense Refill   calcium carbonate (TUMS EX) 750 MG chewable tablet Chew 1 tablet by mouth daily as needed for heartburn.     colchicine 0.6 MG tablet Take 0.6 mg by mouth daily as needed (gout).     famotidine-calcium carbonate-magnesium hydroxide (PEPCID COMPLETE) 10-800-165 MG chewable tablet Chew 1 tablet by mouth daily as needed (acid reflux).     mesalamine (LIALDA) 1.2 G EC tablet Take 2.4 g by mouth 2 (two) times daily. Two tablets in the morning and Two tablets at bedtime     Methylcellulose, Laxative, (CITRUCEL PO) Take 0.5 tablets by mouth daily as needed (regularity).     traMADol (ULTRAM) 50 MG tablet Take 1-2 tablets (50-100 mg total) by mouth every 6 (six) hours as needed for moderate pain. 40 tablet 0   propranolol (INDERAL) 10 MG tablet Take 1-2 tablets at onset of SVT.  May take up to 40 mg (4 tabs) in one hour. (Patient taking differently: Take by mouth See admin instructions. Take 1-2 tablets at onset of SVT.  May take up to 40 mg (4 tabs) in one hour.) 40 tablet 3   No facility-administered medications prior to visit.    Allergies  Allergen Reactions   Other Rash   Sulfonamide Derivatives Rash   Sulfa Antibiotics Rash   Nsaids Other (See Comments)    Contraindicated with lialda   Penicillins Rash    Tolerated  Cephalosporin Date: 07/24/20.      ROS Review of Systems  Constitutional:  Negative for fatigue and fever.  Eyes:  Negative for visual disturbance.  Respiratory:  Negative for cough, chest tightness, shortness of breath and wheezing.   Cardiovascular:  Negative for chest pain, palpitations and leg swelling.  Neurological:  Negative for dizziness, seizures, syncope, weakness, light-headedness and headaches.     Objective:    Physical Exam Vitals reviewed.  Constitutional:  Appearance: She is well-developed.  Eyes:     Pupils: Pupils are equal, round, and reactive to light.  Neck:     Thyroid: No thyromegaly.     Vascular: No JVD.  Cardiovascular:     Rate and Rhythm: Normal rate and regular rhythm.     Heart sounds:    No gallop.  Pulmonary:     Effort: Pulmonary effort is normal. No respiratory distress.     Breath sounds: Normal breath sounds. No wheezing or rales.  Neurological:     Mental Status: She is alert.    BP 118/68 (BP Location: Left Arm, Patient Position: Sitting, Cuff Size: Normal)   Pulse 68   Temp 97.8 F (36.6 C) (Oral)   Wt 101 lb 4.8 oz (45.9 kg)   SpO2 98%   BMI 19.95 kg/m  Wt Readings from Last 3 Encounters:  10/10/20 101 lb 4.8 oz (45.9 kg)  08/21/20 98 lb 8 oz (44.7 kg)  07/16/20 103 lb (46.7 kg)     Health Maintenance Due  Topic Date Due   Zoster Vaccines- Shingrix (1 of 2) Never done    There are no preventive care reminders to display for this patient.  Lab Results  Component Value Date   TSH 3.60 08/21/2020   Lab Results  Component Value Date   WBC 10.0 10/04/2020   HGB 13.9 10/04/2020   HCT 41.0 10/04/2020   MCV 89.4 10/04/2020   PLT 271 10/04/2020   Lab Results  Component Value Date   NA 133 (L) 10/04/2020   K 4.6 10/04/2020   CO2 16 (L) 10/04/2020   GLUCOSE 150 (H) 10/04/2020   BUN 18 10/04/2020   CREATININE 0.77 10/04/2020   BILITOT 0.5 08/21/2020   ALKPHOS 90 08/21/2020   AST 10 08/21/2020   ALT 7  08/21/2020   PROT 5.8 (L) 08/21/2020   ALBUMIN 4.1 08/21/2020   CALCIUM 9.1 10/04/2020   ANIONGAP 11 10/04/2020   GFR 74.50 08/21/2020   Lab Results  Component Value Date   CHOL 138 08/21/2020   Lab Results  Component Value Date   HDL 34.10 (L) 08/21/2020   Lab Results  Component Value Date   LDLCALC 96 11/06/2017   Lab Results  Component Value Date   TRIG 223.0 (H) 08/21/2020   Lab Results  Component Value Date   CHOLHDL 4 08/21/2020   Lab Results  Component Value Date   HGBA1C 5.5 10/04/2020      Assessment & Plan:   Problem List Items Addressed This Visit       Unprioritized   WPW (Wolff-Parkinson-White syndrome)   Relevant Medications   propranolol (INDERAL) 10 MG tablet   Other Visit Diagnoses     Hyperkalemia    -  Primary   Relevant Orders   Basic metabolic panel   Fatigue, unspecified type       Relevant Orders   VITAMIN D 25 Hydroxy (Vit-D Deficiency, Fractures)     Patient had recent episode of supraventricular tachycardia lasting about 7 hours and not resolved with propranolol.  Probably converted after adenosine.  She had mildly elevated potassium of 5.3 and does not take any supplementation.  We discussed the following  -Recheck basic metabolic panel -Patient requesting 25 hydroxy vitamin D level.  She is on replacement as above. -Recommend follow-up with cardiology with EP specialist to discuss pros and cons further regarding ablation therapy -She did request [place] to refill her propranolol and this was done  Meds ordered  this encounter  Medications   propranolol (INDERAL) 10 MG tablet    Sig: Take 1-2 tablets at onset of SVT.  May take up to 40 mg (4 tabs) in one hour.    Dispense:  40 tablet    Refill:  3    Follow-up: No follow-ups on file.    Evelena PeatBruce Wissam Resor, MD

## 2020-10-11 NOTE — Telephone Encounter (Signed)
Follow up:     Patient calling to check the status of the provider switch.

## 2020-10-12 ENCOUNTER — Ambulatory Visit: Payer: Medicare PPO | Admitting: Internal Medicine

## 2020-10-26 DIAGNOSIS — H524 Presbyopia: Secondary | ICD-10-CM | POA: Diagnosis not present

## 2020-10-26 DIAGNOSIS — H353131 Nonexudative age-related macular degeneration, bilateral, early dry stage: Secondary | ICD-10-CM | POA: Diagnosis not present

## 2020-10-26 DIAGNOSIS — Z961 Presence of intraocular lens: Secondary | ICD-10-CM | POA: Diagnosis not present

## 2020-11-20 ENCOUNTER — Encounter: Payer: Self-pay | Admitting: *Deleted

## 2020-11-20 ENCOUNTER — Other Ambulatory Visit: Payer: Self-pay

## 2020-11-20 ENCOUNTER — Encounter: Payer: Self-pay | Admitting: Cardiology

## 2020-11-20 ENCOUNTER — Ambulatory Visit: Payer: Medicare PPO | Admitting: Cardiology

## 2020-11-20 VITALS — BP 124/66 | HR 63 | Ht 59.25 in | Wt 101.0 lb

## 2020-11-20 DIAGNOSIS — I456 Pre-excitation syndrome: Secondary | ICD-10-CM

## 2020-11-20 NOTE — Patient Instructions (Addendum)
Medication Instructions:  Your physician recommends that you continue on your current medications as directed. Please refer to the Current Medication list given to you today.  Labwork: None ordered.  Testing/Procedures: Your physician has recommended that you have an ablation. Catheter ablation is a medical procedure used to treat some cardiac arrhythmias (irregular heartbeats). During catheter ablation, a long, thin, flexible tube is put into a blood vessel in your groin (upper thigh), or neck. This tube is called an ablation catheter. It is then guided to your heart through the blood vessel. Radio frequency waves destroy small areas of heart tissue where abnormal heartbeats may cause an arrhythmia to start. Please see the instruction sheet given to you today.     Any Other Special Instructions Will Be Listed Below (If Applicable).  If you need a refill on your cardiac medications before your next appointment, please call your pharmacy.   Cardiac Ablation Cardiac ablation is a procedure to destroy (ablate) some heart tissue that is sending bad signals. These bad signals causeproblems in heart rhythm. The heart has many areas that make these signals. If there are problems in these areas, they can make the heart beat in a way that is not normal.Destroying some tissues can help make the heart rhythm normal. Tell your doctor about: Any allergies you have. All medicines you are taking. These include vitamins, herbs, eye drops, creams, and over-the-counter medicines. Any problems you or family members have had with medicines that make you fall asleep (anesthetics). Any blood disorders you have. Any surgeries you have had. Any medical conditions you have, such as kidney failure. Whether you are pregnant or may be pregnant. What are the risks? This is a safe procedure. But problems may occur, including: Infection. Bruising and bleeding. Bleeding into the chest. Stroke or blood clots. Damage  to nearby areas of your body. Allergies to medicines or dyes. The need for a pacemaker if the normal system is damaged. Failure of the procedure to treat the problem. What happens before the procedure? Medicines Ask your doctor about: Changing or stopping your normal medicines. This is important. Taking aspirin and ibuprofen. Do not take these medicines unless your doctor tells you to take them. Taking other medicines, vitamins, herbs, and supplements. General instructions Follow instructions from your doctor about what you cannot eat or drink. Plan to have someone take you home from the hospital or clinic. If you will be going home right after the procedure, plan to have someone with you for 24 hours. Ask your doctor what steps will be taken to prevent infection. What happens during the procedure?  An IV tube will be put into one of your veins. You will be given a medicine to help you relax. The skin on your neck or groin will be numbed. A cut (incision) will be made in your neck or groin. A needle will be put through your cut and into a large vein. A tube (catheter) will be put into the needle. The tube will be moved to your heart. Dye may be put through the tube. This helps your doctor see your heart. Small devices (electrodes) on the tube will send out signals. A type of energy will be used to destroy some heart tissue. The tube will be taken out. Pressure will be held on your cut. This helps stop bleeding. A bandage will be put over your cut. The exact procedure may vary among doctors and hospitals. What happens after the procedure? You will be watched until you   leave the hospital or clinic. This includes checking your heart rate, breathing rate, oxygen, and blood pressure. Your cut will be watched for bleeding. You will need to lie still for a few hours. Do not drive for 24 hours or as long as your doctor tells you. Summary Cardiac ablation is a procedure to destroy some heart  tissue. This is done to treat heart rhythm problems. Tell your doctor about any medical conditions you may have. Tell him or her about all medicines you are taking to treat them. This is a safe procedure. But problems may occur. These include infection, bruising, bleeding, and damage to nearby areas of your body. Follow what your doctor tells you about food and drink. You may also be told to change or stop some of your medicines. After the procedure, do not drive for 24 hours or as long as your doctor tells you. This information is not intended to replace advice given to you by your health care provider. Make sure you discuss any questions you have with your healthcare provider. Document Revised: 02/10/2019 Document Reviewed: 02/10/2019 Elsevier Patient Education  2022 Elsevier Inc.      

## 2020-11-20 NOTE — Progress Notes (Signed)
Electrophysiology Office Follow up Visit Note:    Date:  11/20/2020   ID:  Caitlin Hicks, DOB 08/19/1945, MRN [id number]  PCP:  [name], MD  [facility] Electrophysiologist:  Lanier Prude, MD    Interval History:    Caitlin Hicks is a 75 y.o. female who presents for a follow up visit.  I last saw the patient October 04, 2020 when she was hospitalized for a narrow complex tachycardia.  In the tachycardia ultimately responded to adenosine.  She has a history of Wolff-Parkinson-White with preexcitation noted on her resting EKG.     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HCC)    Ulcerative colitis    age 59   WPW (Wolff-Parkinson-White syndrome)     Past Surgical History:  Procedure Laterality Date   BREAST BIOPSY Right 02/22/1995   CATARACT EXTRACTION W/ INTRAOCULAR LENS  IMPLANT, BILATERAL Bilateral 2-05/2011   INGUINAL HERNIA REPAIR Right ~ 1981   ORIF HUMERUS FRACTURE Left 01/11/2013   Procedure: LEFT ORIF PROXIMAL HUMERUS FRACTURE;  Surgeon: Senaida Lange, MD;  Location: [facility] OR;  Service: Orthopedics;  Laterality: Left;  SAME DAY LABS   ORIF PROXIMAL HUMERUS FRACTURE Left 01/11/2013   TONSILLECTOMY  1948   TOTAL KNEE ARTHROPLASTY Left 07/23/2020   Procedure: TOTAL KNEE ARTHROPLASTY;  Surgeon: Ollen Gross, MD;  Location: [facility];  Service: Orthopedics;  Laterality: Left;    TUBAL LIGATION  ~ 1985    Current Medications: Current Meds  Medication Sig   calcium carbonate (TUMS EX) 750 MG chewable tablet Chew 1 tablet by mouth daily as needed for heartburn.   colchicine 0.6 MG tablet Take 0.6 mg by mouth daily as needed (gout).   famotidine-calcium carbonate-magnesium hydroxide (PEPCID COMPLETE) 10-800-165 MG chewable tablet Chew 1 tablet by mouth daily as needed (acid reflux).   mesalamine  (LIALDA) 1.2 G EC tablet Take 2.4 g by mouth 2 (two) times daily. Two tablets in the morning and Two tablets at bedtime   Methylcellulose, Laxative, (CITRUCEL PO) Take 0.5 tablets by mouth daily as needed (regularity).   Multiple Vitamins-Minerals (PRESERVISION AREDS 2 PO) Take by mouth.   propranolol (INDERAL) 10 MG tablet Take 1-2 tablets at onset of SVT.  May take up to 40 mg (4 tabs) in one hour.   traMADol (ULTRAM) 50 MG tablet Take 1-2 tablets (50-100 mg total) by mouth every 6 (six) hours as needed for moderate pain.     Allergies:   Other, Sulfonamide derivatives, Sulfa antibiotics, Nsaids, and Penicillins   Social History   Socioeconomic History   Marital status: Married    Spouse name: Not on file   Number of children: Not on file   Years of education: Not on file   Highest education level: Not on file  Occupational History   Not on file  Tobacco Use   Smoking status: Never   Smokeless tobacco: Never  Vaping Use   Vaping Use: Never used  Substance and Sexual Activity   Alcohol use: Yes    Alcohol/week: 2.0 standard drinks    Types: 2 Glasses of wine per week    Comment: 01/11/2013 "2-3 glasses of wine/wk; some weeks not even that"   Drug use: No   Sexual activity: Yes  Other Topics  Concern   Not on file  Social History Narrative   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         Social Determinants of Health   Financial Resource Strain: Low Risk    Difficulty of Paying Living Expenses: Not hard at all  Food Insecurity: No Food Insecurity   Worried About Programme researcher, broadcasting/film/video in the Last Year: Never true   Barista in the Last Year: Never true  Transportation Needs: No Transportation Needs   Lack of Transportation (Medical): No   Lack of Transportation (Non-Medical): No  Physical Activity: Sufficiently Active   Days of Exercise per Week: 7 days   Minutes of Exercise per Session: 40 min  Stress: No Stress Concern Present   Feeling of Stress : Not at all   Social Connections: Moderately Integrated   Frequency of Communication with Friends and Family: More than three times a week   Frequency of Social Gatherings with Friends and Family: Twice a week   Attends Religious Services: More than 4 times per year   Active Member of [employer] or Organizations: No   Attends Banker Meetings: Never   Marital Status: Married     Family History: The patient's family history includes Osteoporosis in her mother; Prostate cancer in an other family member.  ROS:   Please see the history of present illness.    All other systems reviewed and are negative.  EKGs/Labs/Other Studies Reviewed:    The following studies were reviewed today:  Prior records  EKG:  The ekg ordered today demonstrates sinus rhythm.  Preexcitation noted with a positive delta wave in lead I and lead III and lead aVF.  The R to S ratio in lead V1 is less than 1.  Recent Labs: 08/21/2020: ALT 7; TSH 3.60 10/04/2020: Hemoglobin 13.9; Magnesium 2.0; Platelets 271 10/10/2020: BUN 18; Creatinine, Ser 0.73; Potassium 4.2; Sodium 137  Recent Lipid Panel    Component Value Date/Time   CHOL 138 08/21/2020 1226   TRIG 223.0 (H) 08/21/2020 1226   HDL 34.10 (L) 08/21/2020 1226   CHOLHDL 4 08/21/2020 1226   VLDL 44.6 (H) 08/21/2020 1226   LDLCALC 96 11/06/2017 0748   LDLDIRECT 88.0 08/21/2020 1226    Physical Exam:    VS:  BP 124/66   Pulse 63   Ht 4' 11.25" (1.505 m)   Wt 101 lb (45.8 kg)   BMI 20.23 kg/m     Wt Readings from Last 3 Encounters:  11/20/20 101 lb (45.8 kg)  10/10/20 101 lb 4.8 oz (45.9 kg)  08/21/20 98 lb 8 oz (44.7 kg)     GEN:  Well nourished, well developed in no acute distress HEENT: Normal NECK: No JVD; No carotid bruits LYMPHATICS: No lymphadenopathy CARDIAC: RRR, no murmurs, rubs, gallops RESPIRATORY:  Clear to auscultation without rales, wheezing or rhonchi  ABDOMEN: Soft, non-tender, non-distended MUSCULOSKELETAL:  No edema; No deformity   SKIN: Warm and dry NEUROLOGIC:  Alert and oriented x 3 PSYCHIATRIC:  Normal affect   ASSESSMENT:    1. WPW (Wolff-Parkinson-White syndrome)    PLAN:    In order of problems listed above:  #Wolff-Parkinson-White syndrome Patient has a history of narrow complex tachycardia.  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  I discussed treatment options for her including continued conservative management versus EP study and ablation.  She continues to have 3-4 episodes of tachycardia per  year and they are very unpredictable.  Given the unpredictable nature of the tachycardia episodes and the fact that the most recent episode required hospitalization, the patient favors an invasive approach to management.  I discussed EP study and ablation with the patient in detail during today's visit.  I discussed the risk, recovery and efficacy rates.  I discussed the possibility that we would localize the accessory pathway to the region very close to the AV node which would prevent safe ablation.  She wishes to proceed.  Risk, benefits, and alternatives to EP study and radiofrequency ablation for SVT were also discussed in detail today. These risks include but are not limited to complete heart block, stroke, bleeding, vascular damage, tamponade, perforation, and death. The patient understands these risk and wishes to proceed.  We will therefore proceed with catheter ablation at the next available time.  Carto and ICE are requested for the procedure.    Total time spent with patient today 45 minutes. This includes reviewing records, evaluating the patient and coordinating care.   Medication Adjustments/Labs and Tests Ordered: Current medicines are reviewed at length with the patient today.  Concerns regarding medicines are outlined above.  Orders Placed This Encounter  Procedures   CBC w/Diff   Basic Metabolic Panel (BMET)   EKG  12-Lead   No orders of the defined types were placed in this encounter.    Signed, Steffanie Dunn, MD, [facility], [facility] 11/20/2020 7:42 PM    Electrophysiology [facility]

## 2020-11-28 ENCOUNTER — Other Ambulatory Visit: Payer: Self-pay

## 2020-11-28 ENCOUNTER — Ambulatory Visit (INDEPENDENT_AMBULATORY_CARE_PROVIDER_SITE_OTHER): Payer: Medicare PPO

## 2020-11-28 DIAGNOSIS — Z0000 Encounter for general adult medical examination without abnormal findings: Secondary | ICD-10-CM

## 2020-11-28 NOTE — Progress Notes (Addendum)
Virtual Visit via Telephone Note  I connected with  Caitlin Hicks on 11/28/20 at 11:00 AM EDT by telephone and verified that I am speaking with the correct person using two identifiers.  Location: Patient: Home Provider: Office Persons participating in the virtual visit: patient/Nurse Health Advisor   I discussed the limitations, risks, security and privacy concerns of performing an evaluation and management service by telephone and the availability of in person appointments. The patient expressed understanding and agreed to proceed.  Interactive audio and video telecommunications were attempted between this nurse and patient, however failed, due to patient having technical difficulties OR patient did not have access to video capability.  We continued and completed visit with audio only.  Some vital signs may be absent or patient reported.   Marzella Schlein, LPN   Subjective:   Caitlin Hicks is a 75 y.o. female who presents for Medicare Annual (Subsequent) preventive examination.  Review of Systems     Cardiac Risk Factors include: advanced age (>38men, >67 women)     Objective:    There were no vitals filed for this visit. There is no height or weight on file to calculate BMI.  Advanced Directives 11/28/2020 07/23/2020 07/23/2020 07/16/2020 11/23/2019 03/09/2017 06/13/2016  Does Patient Have a Medical Advance Directive? Yes Yes Yes Yes Yes No Yes  Type of Advance Directive Healthcare Power of Attorney Living will;Healthcare Power of [employer] Power of [place];Living will Healthcare Power of [place];Living will Healthcare Power of [place];Living will - Healthcare Power of [place];Living will  Does patient want to make changes to medical advance directive? - No - Patient declined No - Patient declined - No - Patient declined - -  Copy of Healthcare Power of Attorney in Chart? No - copy requested No - copy requested No - copy requested - No - copy requested - -  Would patient  like information on creating a medical advance directive? - - - - - - -    Current Medications (verified) Outpatient Encounter Medications as of 11/28/2020  Medication Sig   calcium carbonate (TUMS EX) 750 MG chewable tablet Chew 1 tablet by mouth daily as needed for heartburn.   Cholecalciferol (VITAMIN D3 PO) Take by mouth daily.   famotidine-calcium carbonate-magnesium hydroxide (PEPCID COMPLETE) [phone] MG chewable tablet Chew 1 tablet by mouth daily as needed (acid reflux).   mesalamine (LIALDA) 1.2 G EC tablet Take 2.4 g by mouth 2 (two) times daily. Two tablets in the morning and Two tablets at bedtime   Methylcellulose, Laxative, (CITRUCEL PO) Take 0.5 tablets by mouth daily as needed (regularity).   Multiple Vitamins-Minerals (PRESERVISION AREDS 2 PO) Take by mouth.   propranolol (INDERAL) 10 MG tablet Take 1-2 tablets at onset of SVT.  May take up to 40 mg (4 tabs) in one hour.   colchicine 0.6 MG tablet Take 0.6 mg by mouth daily as needed (gout). (Patient not taking: Reported on 11/28/2020)   [DISCONTINUED] traMADol (ULTRAM) 50 MG tablet Take 1-2 tablets (50-100 mg total) by mouth every 6 (six) hours as needed for moderate pain. (Patient not taking: Reported on 11/28/2020)   No facility-administered encounter medications on file as of 11/28/2020.    Allergies (verified) Other, Sulfonamide derivatives, Sulfa antibiotics, Nsaids, and Penicillins   History: Past Medical History:  Diagnosis Date   Arthritis    "knees, fingers", knees,   Colitis    Dysrhythmia    , WPW,SVT   GERD (gastroesophageal reflux disease)    Osteoporosis  Segmental colitis (HCC)    Ulcerative colitis    age 50   WPW (Wolff-Parkinson-White syndrome)    Past Surgical History:  Procedure Laterality Date   BREAST BIOPSY Right 02/22/1995   CATARACT EXTRACTION W/ INTRAOCULAR LENS  IMPLANT, BILATERAL Bilateral 2-05/2011   INGUINAL HERNIA REPAIR Right ~ 1981   ORIF HUMERUS FRACTURE Left 01/11/2013    Procedure: LEFT ORIF PROXIMAL HUMERUS FRACTURE;  Surgeon: Senaida Lange, MD;  Location: [facility] OR;  Service: Orthopedics;  Laterality: Left;  SAME DAY LABS   ORIF PROXIMAL HUMERUS FRACTURE Left 01/11/2013   TONSILLECTOMY  1948   TOTAL KNEE ARTHROPLASTY Left 07/23/2020   Procedure: TOTAL KNEE ARTHROPLASTY;  Surgeon: Ollen Gross, MD;  Location: [facility];  Service: Orthopedics;  Laterality: Left;    TUBAL LIGATION  ~ 1985   Family History  Problem Relation Age of Onset   Osteoporosis Mother    Prostate cancer Other    Social History   Socioeconomic History   Marital status: Married    Spouse name: Not on file   Number of children: Not on file   Years of education: Not on file   Highest education level: Not on file  Occupational History   Not on file  Tobacco Use   Smoking status: Never   Smokeless tobacco: Never  Vaping Use   Vaping Use: Never used  Substance and Sexual Activity   Alcohol use: Yes    Alcohol/week: 2.0 standard drinks    Types: 2 Glasses of wine per week    Comment: 01/11/2013 "2-3 glasses of wine/wk; some weeks not even that"   Drug use: No   Sexual activity: Yes  Other Topics Concern   Not on file  Social History Narrative   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         Social Determinants of Health   Financial Resource Strain: Low Risk    Difficulty of Paying Living Expenses: Not hard at all  Food Insecurity: No Food Insecurity   Worried About Programme researcher, broadcasting/film/video in the Last Year: Never true   Ran Out of Food in the Last Year: Never true  Transportation Needs: No Transportation Needs   Lack of Transportation (Medical): No   Lack of Transportation (Non-Medical): No  Physical Activity: Sufficiently Active   Days of Exercise per Week: 7 days   Minutes of Exercise per Session: 60 min  Stress: No Stress Concern Present   Feeling of Stress : Not at all  Social Connections: Socially Integrated   Frequency of Communication with Friends and Family: More  than three times a week   Frequency of Social Gatherings with Friends and Family: More than three times a week   Attends Religious Services: More than 4 times per year   Active Member of [employer] or Organizations: Yes   Attends Banker Meetings: 1 to 4 times per year   Marital Status: Married    Tobacco Counseling Counseling given: Not Answered   Clinical Intake:  Pre-visit preparation completed: Yes  Pain : No/denies pain     BMI - recorded: 20.23 Nutritional Status: BMI of 19-24  Normal Nutritional Risks: None Diabetes: No  How often do you need to have someone help you when you read instructions, pamphlets, or other written materials from your doctor or pharmacy?: 1 - Never  Diabetic?No  Interpreter Needed?: No  Information entered by :: Lanier Ensign, LPN   Activities of Daily Living In your present state  of health, do you have any difficulty performing the following activities: 11/28/2020 07/23/2020  Hearing? N -  Vision? N -  Difficulty concentrating or making decisions? N -  Walking or climbing stairs? N -  Dressing or bathing? N -  Doing errands, shopping? N N  Preparing Food and eating ? N -  Using the Toilet? N -  In the past six months, have you accidently leaked urine? N -  Do you have problems with loss of bowel control? N -  Managing your Medications? N -  Managing your Finances? N -  Housekeeping or managing your Housekeeping? N -  Some recent data might be hidden    Patient Care Team: [name], MD as PCP - General Lanier Prude, MD as PCP - Electrophysiology (Cardiology) Antony Contras, MD as Consulting Physician (Ophthalmology)  Indicate any recent Medical Services you may have received from other than [facility] providers in the past year (date may be approximate).     Assessment:   This is a routine wellness examination for Caitlin Hicks.  Hearing/Vision screen Hearing Screening - Comments:: Pt denies any hearing issues  Vision  Screening - Comments:: Pt follows up with Dr Randon Goldsmith for annual eye exams   Dietary issues and exercise activities discussed: Current Exercise Habits: Home exercise routine, Type of exercise: walking;Other - see comments, Time (Minutes): 60, Frequency (Times/Week): 7, Weekly Exercise (Minutes/Week): 420   Goals Addressed             This Visit's Progress    Patient Stated       To keep physically fit        Depression Screen PHQ 2/9 Scores 11/28/2020 11/23/2019 04/27/2019 11/06/2017 07/01/2016 04/28/2016 09/11/2015  PHQ - 2 Score 0 0 0 0 0 0 0  PHQ- 9 Score - 0 0 - - - -  Exception Documentation - - - - - - -    Fall Risk Fall Risk  11/28/2020 11/23/2019 05/02/2019 11/06/2017 07/01/2016  Falls in the past year? 0 0 0 No No  Number falls in past yr: 0 0 - - -  Injury with Fall? 0 0 - - -  Risk for fall due to : Impaired vision No Fall Risks - - -  Follow up Falls prevention discussed Falls evaluation completed;Falls prevention discussed - - -    FALL RISK PREVENTION PERTAINING TO THE HOME:  Any stairs in or around the home? Yes  If so, are there any without handrails? No  Home free of loose throw rugs in walkways, pet beds, electrical cords, etc? Yes  Adequate lighting in your home to reduce risk of falls? Yes   ASSISTIVE DEVICES UTILIZED TO PREVENT FALLS:  Life alert? Yes  apple watch Use of a cane, walker or w/c? No  Grab bars in the bathroom? Yes  Shower chair or bench in shower? No  Elevated toilet seat or a handicapped toilet? No   TIMED UP AND GO:  Was the test performed? No .   Cognitive Function:     6CIT Screen 11/28/2020  What Year? 0 points  What month? 0 points  What time? 0 points  Count back from 20 0 points  Months in reverse 0 points  Repeat phrase 0 points  Total Score 0    Immunizations Immunization History  Administered Date(s) Administered   Influenza Split 01/23/2011   Influenza Whole 12/22/2009, 01/03/2014   Influenza, High Dose Seasonal PF  02/07/2016, 01/22/2017, 01/08/2018   Influenza-Unspecified 01/05/2013, 12/23/2014,  01/07/2019   PFIZER(Purple Top)SARS-COV-2 Vaccination 04/13/2019, 05/04/2019, 12/23/2019, 08/16/2020   Pneumococcal Conjugate-13 12/09/2013   Pneumococcal Polysaccharide-23 06/28/2008   Tdap 12/09/2013   Zoster, Live 03/25/2007    TDAP status: Up to date  Flu Vaccine status: Due, Education has been provided regarding the importance of this vaccine. Advised may receive this vaccine at local pharmacy or Health Dept. Aware to provide a copy of the vaccination record if obtained from local pharmacy or Health Dept. Verbalized acceptance and understanding.  Pneumococcal vaccine status: Up to date  Covid-19 vaccine status: Completed vaccines  Qualifies for Shingles Vaccine? Yes   Zostavax completed Yes   Shingrix Completed?: No.    Education has been provided regarding the importance of this vaccine. Patient has been advised to call insurance company to determine out of pocket expense if they have not yet received this vaccine. Advised may also receive vaccine at local pharmacy or Health Dept. Verbalized acceptance and understanding.  Screening Tests Health Maintenance  Topic Date Due   Zoster Vaccines- Shingrix (1 of 2) Never done   INFLUENZA VACCINE  10/22/2020   Hepatitis C Screening  08/21/2021 (Originally 05/24/1963)   TETANUS/TDAP  12/10/2023   DEXA SCAN  Completed   COVID-19 Vaccine  Completed   PNA vac Low Risk Adult  Completed   HPV VACCINES  Aged Out    Health Maintenance  Health Maintenance Due  Topic Date Due   Zoster Vaccines- Shingrix (1 of 2) Never done   INFLUENZA VACCINE  10/22/2020    Colorectal cancer screening: Type of screening: Colonoscopy. Completed 02/15/19. Repeat every 5 years  Mammogram status: No longer required due to pt preference.  Bone Density status: Completed 04/19/19. Results reflect: Bone density results: OSTEOPOROSIS. Repeat every 2 years.    Additional  Screening:  Vision Screening: Recommended annual ophthalmology exams for early detection of glaucoma and other disorders of the eye. Is the patient up to date with their annual eye exam?  Yes  Who is the provider or what is the name of the office in which the patient attends annual eye exams? Dr Randon GoldsmithLyles If pt is not established with a provider, would they like to be referred to a provider to establish care? No .   Dental Screening: Recommended annual dental exams for proper oral hygiene  Community Resource Referral / Chronic Care Management: CRR required this visit?  No   CCM required this visit?  No      Plan:     I have personally reviewed and noted the following in the patient's chart:   Medical and social history Use of alcohol, tobacco or illicit drugs  Current medications and supplements including opioid prescriptions.  Functional ability and status Nutritional status Physical activity Advanced directives List of other physicians Hospitalizations, surgeries, and ER visits in previous 12 months Vitals Screenings to include cognitive, depression, and falls Referrals and appointments  In addition, I have reviewed and discussed with patient certain preventive protocols, quality metrics, and best practice recommendations. A written personalized care plan for preventive services as well as general preventive health recommendations were provided to patient.     Marzella Schleinina H Suprina [place], LPN   1/6/10969/09/2020   Nurse Notes: None

## 2020-11-28 NOTE — Patient Instructions (Signed)
[name] , Thank you for taking time to come for your Medicare Wellness Visit. I appreciate your ongoing commitment to your health goals. Please review the following plan we discussed and let me know if I can assist you in the future.   Screening recommendations/referrals: Colonoscopy: Done 02/15/19 repeat every 5 years due 02/15/24 Mammogram: no longer per pt preference Bone Density: Done 02/15/19 repeat every 2 years  Recommended yearly ophthalmology/optometry visit for glaucoma screening and checkup Recommended yearly dental visit for hygiene and checkup  Vaccinations: Influenza vaccine: Due Pneumococcal vaccine: Completed  Tdap vaccine: Done 12/09/13 repeat every 10 years due 12/10/23 Shingles vaccine: Shingrix discussed. Please contact your pharmacy for coverage information.    Covid-19:Completed 1/20, 2/10, 01/01/20 & 08/17/20  Advanced directives: Please bring a copy of your health care power of attorney and living will to the office at your convenience.  Conditions/risks identified: Stay healthy and fit   Next appointment: Follow up in one year for your annual wellness visit    Preventive Care 65 Years and Older, Female Preventive care refers to lifestyle choices and visits with your health care provider that can promote health and wellness. What does preventive care include? A yearly physical exam. This is also called an annual well check. Dental exams once or twice a year. Routine eye exams. Ask your health care provider how often you should have your eyes checked. Personal lifestyle choices, including: Daily care of your teeth and gums. Regular physical activity. Eating a healthy diet. Avoiding tobacco and drug use. Limiting alcohol use. Practicing safe sex. Taking low-dose aspirin every day. Taking vitamin and mineral supplements as recommended by your health care provider. What happens during an annual well check? The services and screenings done by your health  care provider during your annual well check will depend on your age, overall health, lifestyle risk factors, and family history of disease. Counseling  Your health care provider may ask you questions about your: Alcohol use. Tobacco use. Drug use. Emotional well-being. Home and relationship well-being. Sexual activity. Eating habits. History of falls. Memory and ability to understand (cognition). Work and work Astronomer. Reproductive health. Screening  You may have the following tests or measurements: Height, weight, and BMI. Blood pressure. Lipid and cholesterol levels. These may be checked every 5 years, or more frequently if you are over 51 years old. Skin check. Lung cancer screening. You may have this screening every year starting at age 77 if you have a 30-pack-year history of smoking and currently smoke or have quit within the past 15 years. Fecal occult blood test (FOBT) of the stool. You may have this test every year starting at age 71. Flexible sigmoidoscopy or colonoscopy. You may have a sigmoidoscopy every 5 years or a colonoscopy every 10 years starting at age 14. Hepatitis C blood test. Hepatitis B blood test. Sexually transmitted disease (STD) testing. Diabetes screening. This is done by checking your blood sugar (glucose) after you have not eaten for a while (fasting). You may have this done every 1-3 years. Bone density scan. This is done to screen for osteoporosis. You may have this done starting at age 69. Mammogram. This may be done every 1-2 years. Talk to your health care provider about how often you should have regular mammograms. Talk with your health care provider about your test results, treatment options, and if necessary, the need for more tests. Vaccines  Your health care provider may recommend certain vaccines, such as: Influenza vaccine. This is recommended every  year. Tetanus, diphtheria, and acellular pertussis (Tdap, Td) vaccine. You may need a Td  booster every 10 years. Zoster vaccine. You may need this after age 39. Pneumococcal 13-valent conjugate (PCV13) vaccine. One dose is recommended after age 3. Pneumococcal polysaccharide (PPSV23) vaccine. One dose is recommended after age 68. Talk to your health care provider about which screenings and vaccines you need and how often you need them. This information is not intended to replace advice given to you by your health care provider. Make sure you discuss any questions you have with your health care provider. Document Released: 04/06/2015 Document Revised: 11/28/2015 Document Reviewed: 01/09/2015 Elsevier Interactive Patient Education  2017 [place] Prevention in the Home Falls can cause injuries. They can happen to people of all ages. There are many things you can do to make your home safe and to help prevent falls. What can I do on the outside of my home? Regularly fix the edges of walkways and driveways and fix any cracks. Remove anything that might make you trip as you walk through a door, such as a raised step or threshold. Trim any bushes or trees on the path to your home. Use bright outdoor lighting. Clear any walking paths of anything that might make someone trip, such as rocks or tools. Regularly check to see if handrails are loose or broken. Make sure that both sides of any steps have handrails. Any raised decks and porches should have guardrails on the edges. Have any leaves, snow, or ice cleared regularly. Use sand or salt on walking paths during winter. Clean up any spills in your garage right away. This includes oil or grease spills. What can I do in the bathroom? Use night lights. Install grab bars by the toilet and in the tub and shower. Do not use towel bars as grab bars. Use non-skid mats or decals in the tub or shower. If you need to sit down in the shower, use a plastic, non-slip stool. Keep the floor dry. Clean up any water that spills on the floor  as soon as it happens. Remove soap buildup in the tub or shower regularly. Attach bath mats securely with double-sided non-slip rug tape. Do not have throw rugs and other things on the floor that can make you trip. What can I do in the bedroom? Use night lights. Make sure that you have a light by your bed that is easy to reach. Do not use any sheets or blankets that are too big for your bed. They should not hang down onto the floor. Have a firm chair that has side arms. You can use this for support while you get dressed. Do not have throw rugs and other things on the floor that can make you trip. What can I do in the kitchen? Clean up any spills right away. Avoid walking on wet floors. Keep items that you use a lot in easy-to-reach places. If you need to reach something above you, use a strong step stool that has a grab bar. Keep electrical cords out of the way. Do not use floor polish or wax that makes floors slippery. If you must use wax, use non-skid floor wax. Do not have throw rugs and other things on the floor that can make you trip. What can I do with my stairs? Do not leave any items on the stairs. Make sure that there are handrails on both sides of the stairs and use them. Fix handrails that are broken or  loose. Make sure that handrails are as long as the stairways. Check any carpeting to make sure that it is firmly attached to the stairs. Fix any carpet that is loose or worn. Avoid having throw rugs at the top or bottom of the stairs. If you do have throw rugs, attach them to the floor with carpet tape. Make sure that you have a light switch at the top of the stairs and the bottom of the stairs. If you do not have them, ask someone to add them for you. What else can I do to help prevent falls? Wear shoes that: Do not have high heels. Have rubber bottoms. Are comfortable and fit you well. Are closed at the toe. Do not wear sandals. If you use a stepladder: Make sure that it is  fully opened. Do not climb a closed stepladder. Make sure that both sides of the stepladder are locked into place. Ask someone to hold it for you, if possible. Clearly mark and make sure that you can see: Any grab bars or handrails. First and last steps. Where the edge of each step is. Use tools that help you move around (mobility aids) if they are needed. These include: Canes. Walkers. Scooters. Crutches. Turn on the lights when you go into a dark area. Replace any light bulbs as soon as they burn out. Set up your furniture so you have a clear path. Avoid moving your furniture around. If any of your floors are uneven, fix them. If there are any pets around you, be aware of where they are. Review your medicines with your doctor. Some medicines can make you feel dizzy. This can increase your chance of falling. Ask your doctor what other things that you can do to help prevent falls. This information is not intended to replace advice given to you by your health care provider. Make sure you discuss any questions you have with your health care provider. Document Released: 01/04/2009 Document Revised: 08/16/2015 Document Reviewed: 04/14/2014 Elsevier Interactive Patient Education  2017 [employer].

## 2021-01-10 ENCOUNTER — Other Ambulatory Visit: Payer: Self-pay

## 2021-01-10 ENCOUNTER — Other Ambulatory Visit: Payer: Medicare PPO

## 2021-01-10 DIAGNOSIS — I456 Pre-excitation syndrome: Secondary | ICD-10-CM

## 2021-01-10 LAB — CBC WITH DIFFERENTIAL/PLATELET
Basophils Absolute: 0 10*3/uL (ref 0.0–0.2)
Basos: 1 %
EOS (ABSOLUTE): 0.1 10*3/uL (ref 0.0–0.4)
Eos: 2 %
Hematocrit: 39.1 % (ref 34.0–46.6)
Hemoglobin: 13.3 g/dL (ref 11.1–15.9)
Lymphocytes Absolute: 1.5 10*3/uL (ref 0.7–3.1)
Lymphs: 32 %
MCH: 27.9 pg (ref 26.6–33.0)
MCHC: 34 g/dL (ref 31.5–35.7)
MCV: 82 fL (ref 79–97)
Monocytes Absolute: 0.5 10*3/uL (ref 0.1–0.9)
Monocytes: 10 %
Neutrophils Absolute: 2.6 10*3/uL (ref 1.4–7.0)
Neutrophils: 55 %
Platelets: 220 10*3/uL (ref 150–450)
RBC: 4.76 x10E6/uL (ref 3.77–5.28)
RDW: 17.1 % — ABNORMAL HIGH (ref 11.7–15.4)
WBC: 4.7 10*3/uL (ref 3.4–10.8)

## 2021-01-10 LAB — BASIC METABOLIC PANEL
BUN/Creatinine Ratio: 28 (ref 12–28)
BUN: 20 mg/dL (ref 8–27)
CO2: 27 mmol/L (ref 20–29)
Calcium: 9.9 mg/dL (ref 8.7–10.3)
Chloride: 106 mmol/L (ref 96–106)
Creatinine, Ser: 0.72 mg/dL (ref 0.57–1.00)
Glucose: 89 mg/dL (ref 70–99)
Potassium: 4.5 mmol/L (ref 3.5–5.2)
Sodium: 141 mmol/L (ref 134–144)
eGFR: 86 mL/min/{1.73_m2} (ref >59)

## 2021-02-01 NOTE — Pre-Procedure Instructions (Signed)
Instructed patient on the following items: °Arrival time 0530 °Nothing to eat or drink after midnight °No meds AM of procedure °Responsible person to drive you home and stay with you for 24 hrs ° ° °   °

## 2021-02-03 ENCOUNTER — Encounter (HOSPITAL_COMMUNITY): Payer: Self-pay | Admitting: Cardiology

## 2021-02-04 ENCOUNTER — Other Ambulatory Visit: Payer: Self-pay

## 2021-02-04 ENCOUNTER — Ambulatory Visit (HOSPITAL_COMMUNITY): Payer: Medicare PPO | Admitting: Certified Registered Nurse Anesthetist

## 2021-02-04 ENCOUNTER — Other Ambulatory Visit (HOSPITAL_COMMUNITY): Payer: Self-pay

## 2021-02-04 ENCOUNTER — Ambulatory Visit (HOSPITAL_COMMUNITY)
Admission: RE | Admit: 2021-02-04 | Discharge: 2021-02-04 | Disposition: A | Discharge status: Home / Self Care | Payer: Medicare PPO | Attending: Cardiology | Admitting: Cardiology

## 2021-02-04 ENCOUNTER — Encounter (HOSPITAL_COMMUNITY)
Admission: RE | Disposition: A | Discharge status: Home / Self Care | Payer: Self-pay | Source: Home / Self Care | Attending: Cardiology

## 2021-02-04 DIAGNOSIS — M810 Age-related osteoporosis without current pathological fracture: Secondary | ICD-10-CM | POA: Diagnosis not present

## 2021-02-04 DIAGNOSIS — K219 Gastro-esophageal reflux disease without esophagitis: Secondary | ICD-10-CM | POA: Diagnosis not present

## 2021-02-04 DIAGNOSIS — I456 Pre-excitation syndrome: Secondary | ICD-10-CM | POA: Insufficient documentation

## 2021-02-04 DIAGNOSIS — I471 Supraventricular tachycardia: Secondary | ICD-10-CM | POA: Insufficient documentation

## 2021-02-04 HISTORY — PX: SVT ABLATION: EP1225

## 2021-02-04 LAB — POCT ACTIVATED CLOTTING TIME
Activated Clotting Time: 271 seconds
Activated Clotting Time: 306 seconds

## 2021-02-04 SURGERY — SVT ABLATION
Anesthesia: General

## 2021-02-04 MED ORDER — PROPOFOL 10 MG/ML IV BOLUS
INTRAVENOUS | Status: DC | PRN
  Administered 2021-02-04: 150 mg via INTRAVENOUS

## 2021-02-04 MED ORDER — HEPARIN (PORCINE) IN NACL 1000-0.9 UT/500ML-% IV SOLN
INTRAVENOUS | Status: DC | PRN
  Administered 2021-02-04 (×2): 500 mL

## 2021-02-04 MED ORDER — ISOPROTERENOL HCL 0.2 MG/ML IJ SOLN
INTRAVENOUS | Status: DC | PRN
  Administered 2021-02-04: 2 ug/min via INTRAVENOUS

## 2021-02-04 MED ORDER — HEPARIN SODIUM (PORCINE) 1000 UNIT/ML IJ SOLN
INTRAMUSCULAR | Status: DC | PRN
  Administered 2021-02-04: 7000 [IU] via INTRAVENOUS
  Administered 2021-02-04: 2000 [IU] via INTRAVENOUS

## 2021-02-04 MED ORDER — ONDANSETRON HCL 4 MG/2ML IJ SOLN: 4.0000 mg | Freq: Four times a day (QID) | INTRAMUSCULAR | Status: DC | PRN

## 2021-02-04 MED ORDER — PHENYLEPHRINE HCL-NACL 20-0.9 MG/250ML-% IV SOLN
INTRAVENOUS | Status: DC | PRN
  Administered 2021-02-04: 25 ug/min via INTRAVENOUS

## 2021-02-04 MED ORDER — SODIUM CHLORIDE 0.9% FLUSH: 3.0000 mL | Freq: Two times a day (BID) | INTRAVENOUS | Status: DC

## 2021-02-04 MED ORDER — SODIUM CHLORIDE 0.9% FLUSH: 3.0000 mL | INTRAVENOUS | Status: DC | PRN

## 2021-02-04 MED ORDER — APIXABAN 5 MG PO TABS
5.0000 mg | ORAL_TABLET | Freq: Two times a day (BID) | ORAL | 0 refills | Status: DC
  Filled 2021-02-04: qty 60, 30d supply, fill #0

## 2021-02-04 MED ORDER — HEPARIN SODIUM (PORCINE) 1000 UNIT/ML IJ SOLN
INTRAMUSCULAR | Status: DC | PRN
  Administered 2021-02-04: 1000 [IU] via INTRAVENOUS

## 2021-02-04 MED ORDER — LIDOCAINE HCL (CARDIAC) PF 100 MG/5ML IV SOSY
PREFILLED_SYRINGE | INTRAVENOUS | Status: DC | PRN
  Administered 2021-02-04: 50 mg via INTRATRACHEAL

## 2021-02-04 MED ORDER — HEPARIN SODIUM (PORCINE) 1000 UNIT/ML IJ SOLN
INTRAMUSCULAR | Status: AC
  Filled 2021-02-04: qty 1

## 2021-02-04 MED ORDER — SODIUM CHLORIDE 0.9 % IV SOLN: INTRAVENOUS | Status: DC

## 2021-02-04 MED ORDER — SUGAMMADEX SODIUM 200 MG/2ML IV SOLN
INTRAVENOUS | Status: DC | PRN
  Administered 2021-02-04 (×2): 50 mg via INTRAVENOUS
  Administered 2021-02-04: 100 mg via INTRAVENOUS

## 2021-02-04 MED ORDER — SODIUM CHLORIDE 0.9 % IV SOLN: 250.0000 mL | INTRAVENOUS | Status: DC | PRN

## 2021-02-04 MED ORDER — FENTANYL CITRATE (PF) 100 MCG/2ML IJ SOLN
INTRAMUSCULAR | Status: DC | PRN
  Administered 2021-02-04 (×2): 50 ug via INTRAVENOUS

## 2021-02-04 MED ORDER — BUPIVACAINE HCL (PF) 0.25 % IJ SOLN
INTRAMUSCULAR | Status: DC | PRN
  Administered 2021-02-04: 30 mL

## 2021-02-04 MED ORDER — ONDANSETRON HCL 4 MG/2ML IJ SOLN
INTRAMUSCULAR | Status: DC | PRN
  Administered 2021-02-04: 4 mg via INTRAVENOUS

## 2021-02-04 MED ORDER — PROPOFOL 500 MG/50ML IV EMUL
INTRAVENOUS | Status: DC | PRN
  Administered 2021-02-04: 75 ug/kg/min via INTRAVENOUS

## 2021-02-04 MED ORDER — ACETAMINOPHEN 325 MG PO TABS: 650.0000 mg | ORAL_TABLET | ORAL | Status: DC | PRN

## 2021-02-04 MED ORDER — DEXAMETHASONE SODIUM PHOSPHATE 10 MG/ML IJ SOLN
INTRAMUSCULAR | Status: DC | PRN
  Administered 2021-02-04: 10 mg via INTRAVENOUS

## 2021-02-04 MED ORDER — HEPARIN (PORCINE) IN NACL 1000-0.9 UT/500ML-% IV SOLN
INTRAVENOUS | Status: AC
  Filled 2021-02-04: qty 500

## 2021-02-04 MED ORDER — ROCURONIUM BROMIDE 100 MG/10ML IV SOLN
INTRAVENOUS | Status: DC | PRN
  Administered 2021-02-04: 30 mg via INTRAVENOUS
  Administered 2021-02-04: 10 mg via INTRAVENOUS

## 2021-02-04 MED ORDER — PROTAMINE SULFATE 10 MG/ML IV SOLN
INTRAVENOUS | Status: DC | PRN
  Administered 2021-02-04: 25 mg via INTRAVENOUS
  Administered 2021-02-04: 5 mg via INTRAVENOUS

## 2021-02-04 MED ORDER — PHENYLEPHRINE 40 MCG/ML (10ML) SYRINGE FOR IV PUSH (FOR BLOOD PRESSURE SUPPORT)
PREFILLED_SYRINGE | INTRAVENOUS | Status: DC | PRN
  Administered 2021-02-04 (×2): 120 ug via INTRAVENOUS
  Administered 2021-02-04 (×2): 80 ug via INTRAVENOUS
  Administered 2021-02-04 (×2): 40 ug via INTRAVENOUS

## 2021-02-04 MED ORDER — MIDAZOLAM HCL 5 MG/5ML IJ SOLN
INTRAMUSCULAR | Status: DC | PRN
  Administered 2021-02-04: 1 mg via INTRAVENOUS

## 2021-02-04 MED ORDER — BUPIVACAINE HCL (PF) 0.25 % IJ SOLN
INTRAMUSCULAR | Status: AC
  Filled 2021-02-04: qty 30

## 2021-02-04 MED ORDER — ISOPROTERENOL HCL 0.2 MG/ML IJ SOLN
INTRAMUSCULAR | Status: AC
  Filled 2021-02-04: qty 5

## 2021-02-04 SURGICAL SUPPLY — 18 items
BAG SNAP BAND KOVER 36X36 (MISCELLANEOUS) ×1 IMPLANT
CATH 8FR REPROCESSED SOUNDSTAR (CATHETERS) ×2 IMPLANT
CATH 8FR SOUNDSTAR REPROCESSED (CATHETERS) IMPLANT
CATH CRD2 QUAD 6FR 5 (CATHETERS) ×1 IMPLANT
CATH JOSEPH QUAD ALLRED 6F REP (CATHETERS) ×1 IMPLANT
CATH SMTCH THERMOCOOL SF DF (CATHETERS) ×1 IMPLANT
CATH WEBSTER BI DIR CS D-F CRV (CATHETERS) ×1 IMPLANT
CLOSURE PERCLOSE PROSTYLE (VASCULAR PRODUCTS) ×4 IMPLANT
PACK EP LATEX FREE (CUSTOM PROCEDURE TRAY) ×2
PACK EP LF (CUSTOM PROCEDURE TRAY) ×1 IMPLANT
PAD PRO RADIOLUCENT 2001M-C (PAD) ×2 IMPLANT
PATCH CARTO3 (PAD) ×1 IMPLANT
SHEATH BAYLIS TRANSSEPTAL 98CM (NEEDLE) ×1 IMPLANT
SHEATH CARTO VIZIGO SM CVD (SHEATH) ×1 IMPLANT
SHEATH PINNACLE 8F 10CM (SHEATH) ×3 IMPLANT
SHEATH PINNACLE 9F 10CM (SHEATH) ×1 IMPLANT
SHEATH PROBE COVER 6X72 (BAG) ×1 IMPLANT
TUBING SMART ABLATE COOLFLOW (TUBING) ×1 IMPLANT

## 2021-02-04 NOTE — H&P (Signed)
Electrophysiology Office Follow up Visit Note:     Date:  11/20/2020    ID:  Caitlin Hicks, DOB 07/12/1945, MRN [id number]   PCP:  Burchette, Bruce W, MD           [facility] Electrophysiologist:  Tiaria Biby T Tyronn Golda, MD      Interval History:     Caitlin Hicks is a 75 y.o. female who presents for a follow up visit.  I last saw the patient October 04, 2020 when she was hospitalized for a narrow complex tachycardia.  In the tachycardia ultimately responded to adenosine.  She has a history of Wolff-Parkinson-White with preexcitation noted on her resting EKG.             Past Medical History:  Diagnosis Date   Arthritis      "knees, fingers", knees,   Colitis     Dysrhythmia      , WPW,SVT   GERD (gastroesophageal reflux disease)     Osteoporosis     Segmental colitis (HCC)     Ulcerative colitis      age 53   WPW (Wolff-Parkinson-White syndrome)        [id number] Juleen ChinaIMarRichar1914a Overlietinamegical History:  ProcSwazilBea39Tomasa Juleen ChinaIRichar191  ion Sig   calcium carbonate (TUMS EX) 750 MG chewable tablet Chew 1 tablet by mouth daily as needed for heartburn.   colchicine 0.6 MG tablet Take 0.6 mg by mouth daily as needed (gout).   famotidine-calcium carbonate-magnesium hydroxide (PEPCID COMPLETE) 10-800-165 MG chewable  tablet Chew 1 tablet by mouth daily as needed (acid reflux).   mesalamine (LIALDA) 1.2 G EC tablet Take 2.4 g by mouth 2 (two) times daily. Two tablets in the morning and Two tablets at bedtime   Methylcellulose, Laxative, (CITRUCEL PO) Take 0.5 tablets by mouth daily as needed (regularity).   Multiple Vitamins-Minerals (PRESERVISION AREDS 2 PO) Take by mouth.   propranolol (INDERAL) 10 MG tablet Take 1-2 tablets at onset of SVT.  May take up to 40 mg (4 tabs) in one hour.   traMADol (ULTRAM) 50 MG tablet Take 1-2 tablets (50-100 mg total) by mouth every 6 (six) hours as needed for moderate pain.        Allergies:   Other, Sulfonamide derivatives, Sulfa antibiotics, Nsaids, and Penicillins    Social History         Socioeconomic History   Marital status: Married      Spouse name: Not on file   Number of children: Not on file   Years of education: Not on file   Highest education level: Not on file  Occupational History   Not on file  Tobacco Use   Smoking status: Never  Smokeless tobacco: Never  Vaping Use   Vaping Use: Never used  Substance and Sexual Activity   Alcohol use: Yes      Alcohol/week: 2.0 standard drinks      Types: 2 Glasses of wine per week      Comment: 01/11/2013 "2-3 glasses of wine/wk; some weeks not even that"   Drug use: No   Sexual activity: Yes  Other Topics Concern   Not on file  Social History Narrative    RN for [employer]    Married              Social Determinants of Health       Financial Resource Strain: Low Risk    Difficulty of Paying Living Expenses: Not hard at all  Food Insecurity: No Food Insecurity   Worried About Charity fundraiser in the Last Year: Never true   [place] in the Last Year: Never true  Transportation Needs: No Transportation Needs   Lack of Transportation (Medical): No   Lack of Transportation (Non-Medical): No  Physical Activity: Sufficiently Active   Days of Exercise per Week: 7 days   Minutes of  Exercise per Session: 40 min  Stress: No Stress Concern Present   Feeling of Stress : Not at all  Social Connections: Moderately Integrated   Frequency of Communication with Friends and Family: More than three times a week   Frequency of Social Gatherings with Friends and Family: Twice a week   Attends Religious Services: More than 4 times per year   Active Member of [employer] or Organizations: No   Attends Archivist Meetings: Never   Marital Status: Married      Family History: The patient's family history includes Osteoporosis in her mother; Prostate cancer in an other family member.   ROS:   Please see the history of present illness.    All other systems reviewed and are negative.   EKGs/Labs/Other Studies Reviewed:     The following studies were reviewed today:   Prior records   EKG:  The ekg ordered today demonstrates sinus rhythm.  Preexcitation noted with a positive delta wave in lead I and lead III and lead aVF.  The R to S ratio in lead V1 is less than 1.   Recent Labs: 08/21/2020: ALT 7; TSH 3.60 10/04/2020: Hemoglobin 13.9; Magnesium 2.0; Platelets 271 10/10/2020: BUN 18; Creatinine, Ser 0.73; Potassium 4.2; Sodium 137  Recent Lipid Panel Labs (Brief)          Component Value Date/Time    CHOL 138 08/21/2020 1226    TRIG 223.0 (H) 08/21/2020 1226    HDL 34.10 (L) 08/21/2020 1226    CHOLHDL 4 08/21/2020 1226    VLDL 44.6 (H) 08/21/2020 1226    LDLCALC 96 11/06/2017 0748    LDLDIRECT 88.0 08/21/2020 1226        Physical Exam:     VS:  BP 124/66   Pulse 63   Ht 4' 11.25" (1.505 m)   Wt 101 lb (45.8 kg)   BMI 20.23 kg/m         Wt Readings from Last 3 Encounters:  11/20/20 101 lb (45.8 kg)  10/10/20 101 lb 4.8 oz (45.9 kg)  08/21/20 98 lb 8 oz (44.7 kg)      GEN:  Well nourished, well developed in no acute distress HEENT: Normal NECK: No JVD; No carotid bruits LYMPHATICS: No lymphadenopathy CARDIAC: RRR, no murmurs, rubs,  gallops  RESPIRATORY:  Clear to auscultation without rales, wheezing or rhonchi  ABDOMEN: Soft, non-tender, non-distended MUSCULOSKELETAL:  No edema; No deformity  SKIN: Warm and dry NEUROLOGIC:  Alert and oriented x 3 PSYCHIATRIC:  Normal affect    ASSESSMENT:     1. WPW (Wolff-Parkinson-White syndrome)     PLAN:     In order of problems listed above:   #Wolff-Parkinson-White syndrome Patient has a history of narrow complex tachycardia.  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   I discussed treatment options for her including continued conservative management versus EP study and ablation.  She continues to have 3-4 episodes of tachycardia per year and they are very unpredictable.  Given the unpredictable nature of the tachycardia episodes and the fact that the most recent episode required hospitalization, the patient favors an invasive approach to management.  I discussed EP study and ablation with the patient in detail during today's visit.  I discussed the risk, recovery and efficacy rates.  I discussed the possibility that we would localize the accessory pathway to the region very close to the AV node which would prevent safe ablation.  She wishes to proceed.   Risk, benefits, and alternatives to EP study and radiofrequency ablation for SVT were also discussed in detail today. These risks include but are not limited to complete heart block, stroke, bleeding, vascular damage, tamponade, perforation, and death. The patient understands these risk and wishes to proceed.  We will therefore proceed with catheter ablation at the next available time.  Carto and ICE are requested for the procedure.      Total time spent with patient today 45 minutes. This includes reviewing records, evaluating the patient and coordinating care.    Medication Adjustments/Labs and Tests Ordered: Current medicines are reviewed at  length with the patient today.  Concerns regarding medicines are outlined above.     Orders Placed This Encounter  Procedures   CBC w/Diff   Basic Metabolic Panel (BMET)   EKG 12-Lead    No orders of the defined types were placed in this encounter.       Signed, Steffanie Dunn, MD, [facility], [facility] 11/20/2020 7:42 PM    Electrophysiology [facility]     ----------------------------------------------------  I have seen, examined the patient, and reviewed the above assessment and plan.    Plan for EP study and possible ablation of SVT. Procedure reviewed. 1 episode of tachycardia since I last saw her.  Lanier Prude, MD 02/04/2021 7:12 AM

## 2021-02-04 NOTE — Discharge Instructions (Addendum)
Cardiac Ablation, Care After  This sheet gives you information about how to care for yourself after your procedure. Your health care provider may also give you more specific instructions. If you have problems or questions, contact your health care provider. What can I expect after the procedure? After the procedure, it is common to have:  Bruising around your puncture site.  Tenderness around your puncture site.  Skipped heartbeats.  Tiredness (fatigue).  Follow these instructions at home: Puncture site care   Follow instructions from your health care provider about how to take care of your puncture site. Make sure you: ? If present, leave stitches (sutures), skin glue, or adhesive strips in place. These skin closures may need to stay in place for up to 2 weeks. If adhesive strip edges start to loosen and curl up, you may trim the loose edges. Do not remove adhesive strips completely unless your health care provider tells you to do that. ? If a square bandage is present, this may be removed in 24 hours.   Check your puncture site every day for signs of infection. Check for: ? Redness, swelling, or pain. ? Fluid or blood. If your puncture site starts to bleed, lie down on your back, apply firm pressure to the area, and contact your health care provider. ? Warmth. ? Pus or a bad smell. Driving  Do not drive for at least 4 days after your procedure or however long your health care provider recommends. (Do not resume driving if you have previously been instructed not to drive for other health reasons.)  Do not drive or use heavy machinery while taking prescription pain medicine. Activity  Avoid activities that take a lot of effort for at least 7 days after your procedure.  Do not lift anything that is heavier than 5 lb (4.5 kg) for one week.   No sexual activity for 1 week.   Return to your normal activities as told by your health care provider. Ask your health care provider what  activities are safe for you. General instructions  Take over-the-counter and prescription medicines only as told by your health care provider.  Do not use any products that contain nicotine or tobacco, such as cigarettes and e-cigarettes. If you need help quitting, ask your health care provider.  You may shower after 24 hours, but Do not take baths, swim, or use a hot tub for 1 week.   Do not drink alcohol for 24 hours after your procedure.  Keep all follow-up visits as told by your health care provider. This is important. Contact a health care provider if:  You have redness, mild swelling, or pain around your puncture site.  You have fluid or blood coming from your puncture site that stops after applying firm pressure to the area.  Your puncture site feels warm to the touch.  You have pus or a bad smell coming from your puncture site.  You have a fever.  You have chest pain or discomfort that spreads to your neck, jaw, or arm.  You are sweating a lot.  You feel nauseous.  You have a fast or irregular heartbeat.  You have shortness of breath.  You are dizzy or light-headed and feel the need to lie down.  You have pain or numbness in the arm or leg closest to your puncture site. Get help right away if:  Your puncture site suddenly swells.  Your puncture site is bleeding and the bleeding does not stop after applying firm   pressure to the area. These symptoms may represent a serious problem that is an emergency. Do not wait to see if the symptoms will go away. Get medical help right away. Call your local emergency services (911 in the [place] Do not drive yourself to the hospital. Summary  After the procedure, it is normal to have bruising and tenderness at the puncture site in your groin, neck, or forearm.  Check your puncture site every day for signs of infection.  Get help right away if your puncture site is bleeding and the bleeding does not stop after applying firm  pressure to the area. This is a medical emergency. This information is not intended to replace advice given to you by your health care provider. Make sure you discuss any questions you have with your health care provider.    

## 2021-02-04 NOTE — Anesthesia Preprocedure Evaluation (Signed)
Anesthesia Evaluation  Patient identified by MRN, date of birth, ID band Patient awake    Reviewed: Allergy & Precautions, H&P , NPO status , Patient's Chart, lab work & pertinent test results  Airway Mallampati: II   Neck ROM: full    Dental   Pulmonary neg pulmonary ROS,    breath sounds clear to auscultation       Cardiovascular + dysrhythmias Supra Ventricular Tachycardia  Rhythm:regular Rate:Normal     Neuro/Psych    GI/Hepatic PUD, GERD  ,  Endo/Other    Renal/GU      Musculoskeletal  (+) Arthritis ,   Abdominal   Peds  Hematology   Anesthesia Other Findings   Reproductive/Obstetrics                             Anesthesia Physical Anesthesia Plan  ASA: 2  Anesthesia Plan: General   Post-op Pain Management:    Induction: Intravenous  PONV Risk Score and Plan: 3 and Ondansetron, Dexamethasone and Treatment may vary due to age or medical condition  Airway Management Planned: Oral ETT  Additional Equipment:   Intra-op Plan:   Post-operative Plan: Extubation in OR  Informed Consent: I have reviewed the patients History and Physical, chart, labs and discussed the procedure including the risks, benefits and alternatives for the proposed anesthesia with the patient or authorized representative who has indicated his/her understanding and acceptance.     Dental advisory given  Plan Discussed with: CRNA, Anesthesiologist and Surgeon  Anesthesia Plan Comments:         Anesthesia Quick Evaluation

## 2021-02-04 NOTE — Transfer of Care (Signed)
Immediate Anesthesia Transfer of Care Note  Patient: Caitlin Hicks  Procedure(s) Performed: SVT ABLATION  Patient Location: Cath Lab  Anesthesia Type:General  Level of Consciousness: awake, alert  and patient cooperative  Airway & Oxygen Therapy: Patient Spontanous Breathing  Post-op Assessment: Report given to RN and Post -op Vital signs reviewed and stable  Post vital signs: Reviewed and stable  Last Vitals:  Vitals Value Taken Time  BP 160/104 02/04/21 1019  Temp    Pulse 59 02/04/21 1022  Resp 15 02/04/21 1022  SpO2 100 % 02/04/21 1022  Vitals shown include unvalidated device data.  Last Pain:  Vitals:   02/04/21 0607  TempSrc:   PainSc: 0-No pain         Complications: No notable events documented.

## 2021-02-04 NOTE — Anesthesia Procedure Notes (Signed)
Procedure Name: MAC Date/Time: 02/04/2021 7:31 AM Performed by: Carolan Clines, CRNA Pre-anesthesia Checklist: Patient identified, Emergency Drugs available, Suction available and Patient being monitored Patient Re-evaluated:Patient Re-evaluated prior to induction Oxygen Delivery Method: Simple face mask Dental Injury: Teeth and Oropharynx as per pre-operative assessment

## 2021-02-04 NOTE — Anesthesia Procedure Notes (Signed)
Procedure Name: Intubation Date/Time: 02/04/2021 8:44 AM Performed by: Carolan Clines, CRNA Pre-anesthesia Checklist: Patient identified, Emergency Drugs available, Suction available and Patient being monitored Patient Re-evaluated:Patient Re-evaluated prior to induction Oxygen Delivery Method: Circle System Utilized Preoxygenation: Pre-oxygenation with 100% oxygen Induction Type: IV induction Ventilation: Mask ventilation without difficulty Laryngoscope Size: Mac and 3 Grade View: Grade I Tube type: Oral Tube size: 7.0 mm Number of attempts: 1 Airway Equipment and Method: Stylet Placement Confirmation: ETT inserted through vocal cords under direct vision, positive ETCO2 and breath sounds checked- equal and bilateral Secured at: 21 cm Tube secured with: Tape Dental Injury: Teeth and Oropharynx as per pre-operative assessment

## 2021-02-05 ENCOUNTER — Encounter (HOSPITAL_COMMUNITY): Payer: Self-pay | Admitting: Cardiology

## 2021-02-05 ENCOUNTER — Other Ambulatory Visit (HOSPITAL_COMMUNITY): Payer: Self-pay

## 2021-02-05 NOTE — Anesthesia Postprocedure Evaluation (Signed)
Anesthesia Post Note  Patient: Caitlin Hicks  Procedure(s) Performed: SVT ABLATION     Patient location during evaluation: PACU Anesthesia Type: General Level of consciousness: awake and alert Pain management: pain level controlled Vital Signs Assessment: post-procedure vital signs reviewed and stable Respiratory status: spontaneous breathing, nonlabored ventilation, respiratory function stable and patient connected to nasal cannula oxygen Cardiovascular status: blood pressure returned to baseline and stable Postop Assessment: no apparent nausea or vomiting Anesthetic complications: no   No notable events documented.  Last Vitals:  Vitals:   02/04/21 1300 02/04/21 1402  BP: 96/71 (!) 110/52  Pulse: (!) 54 (!) 59  Resp: 15 19  Temp:    SpO2: 98% 100%    Last Pain:  Vitals:   02/04/21 1110  TempSrc:   PainSc: 0-No pain                 Jaspreet Hollings S

## 2021-02-21 ENCOUNTER — Telehealth (HOSPITAL_COMMUNITY): Payer: Self-pay

## 2021-02-21 ENCOUNTER — Other Ambulatory Visit (HOSPITAL_COMMUNITY): Payer: Self-pay

## 2021-02-21 NOTE — Telephone Encounter (Signed)
Pharmacy Transitions of Care Follow-up Telephone Call  Date of discharge: 02/04/21  Discharge Diagnosis: Wolff-Pakinson-White syndrome  How have you been since you were released from the hospital?  Patient has been well since discharge, no questions about meds at this time.  Medication changes made at discharge:  - START: Eliquis  Medication changes verified by the patient? Yes    Medication Accessibility:  Home Pharmacy: Not discussed   Was the patient provided with refills on discharged medications? No, not needed   Have all prescriptions been transferred from [facility] to home pharmacy? N/A   Is the patient able to afford medications? 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  APIXABAN (ELIQUIS)  Apixaban 5 mg BID initiated on 02/04/21.  - Discussed importance of taking medication around the same time everyday  - Advised patient of medications to avoid (NSAIDs, ASA)  - Educated that Tylenol (acetaminophen) will be the preferred analgesic to prevent risk of bleeding  - Emphasized importance of monitoring for signs and symptoms of bleeding (abnormal bruising, prolonged bleeding, nose bleeds, bleeding from gums, discolored urine, black tarry stools)  - Advised patient to alert all providers of anticoagulation therapy prior to starting a new medication or having a procedure    Follow-up Appointments:  PCP Hospital f/u appt confirmed?  None currently scheduled  Specialist Hospital f/u appt confirmed? Scheduled to see Dr. Lalla Brothers on 03/20/21 @ 8:15am.   If their condition worsens, is the pt aware to call PCP or go to the Emergency Dept.? Yes  Final Patient Assessment: Patient has follow up scheduled and knows to get refills at f/u if needed

## 2021-03-19 NOTE — Progress Notes (Signed)
Electrophysiology Office Follow up Visit Note:    Date:  03/20/2021   ID:  Caitlin Hicks, DOB 1945-08-19, MRN [id number]  PCP:  [name], MD  [facility] Cardiologist:  None  [facility] Electrophysiologist:  Lanier Prude, MD    Interval History:    Caitlin Hicks is a 75 y.o. female who presents for a follow up visit. She underwent a successful ablation for an AP mediated tachycardia on 02/04/2021. AP was located at 4 o'clock on the MV.   She has done very well since the ablation procedure.  She reports having the presymptoms that she used to have before an episode of SVT but none of the symptoms have actually led to an SVT episode.  She describes them as an "aura".  Groin sites healed well.     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HCC)    Ulcerative colitis    age 73   WPW (Wolff-Parkinson-White syndrome)     Past Surgical History:  Procedure Laterality Date   BREAST BIOPSY Right 02/22/1995   CATARACT EXTRACTION W/ INTRAOCULAR LENS  IMPLANT, BILATERAL Bilateral 2-05/2011   INGUINAL HERNIA REPAIR Right ~ 1981   ORIF HUMERUS FRACTURE Left 01/11/2013   Procedure: LEFT ORIF PROXIMAL HUMERUS FRACTURE;  Surgeon: Senaida Lange, MD;  Location: [facility] OR;  Service: Orthopedics;  Laterality: Left;  SAME DAY LABS   ORIF PROXIMAL HUMERUS FRACTURE Left 01/11/2013   SVT ABLATION N/A 02/04/2021   Procedure: SVT ABLATION;  Surgeon: Lanier Prude, MD;  Location: [facility] INVASIVE CV LAB;  Service: Cardiovascular;  Laterality: N/A;   TONSILLECTOMY  1948   TOTAL KNEE ARTHROPLASTY Left 07/23/2020   Procedure: TOTAL KNEE ARTHROPLASTY;  Surgeon: Ollen Gross, MD;  Location: [facility] ORS;  Service: Orthopedics;  Laterality: Left;    TUBAL LIGATION  ~ 1985    Current Medications: Current Meds  Medication Sig    calcium carbonate (TUMS EX) 750 MG chewable tablet Chew 1 tablet by mouth daily as needed for heartburn.   Calcium-Magnesium-Zinc (CAL-MAG-ZINC PO) Take 1 tablet by mouth 2 (two) times a week.   Cholecalciferol (VITAMIN D3 PO) Take 1 tablet by mouth daily.   famotidine-calcium carbonate-magnesium hydroxide (PEPCID COMPLETE) 10-800-165 MG chewable tablet Chew 1 tablet by mouth daily as needed (acid reflux).   mesalamine (LIALDA) 1.2 G EC tablet Take 2.4 g by mouth 2 (two) times daily. Two tablets in the morning and Two tablets at bedtime   Multiple Vitamins-Minerals (PRESERVISION AREDS 2 PO) Take 1 capsule by mouth 2 (two) times a week.     Allergies:   Sulfonamide derivatives, Sulfa antibiotics, Nsaids, and Penicillins   Social History   Socioeconomic History   Marital status: Married    Spouse name: Not on file   Number of children: Not on file   Years of education: Not on file   Highest education level: Not on file  Occupational History   Not on file  Tobacco Use   Smoking status: Never   Smokeless tobacco: Never  Vaping Use   Vaping Use: Never used  Substance and Sexual Activity   Alcohol use: Yes    Alcohol/week: 2.0 standard drinks    Types: 2 Glasses of wine per week    Comment: 01/11/2013 "2-3 glasses  of wine/wk; some weeks not even that"   Drug use: No   Sexual activity: Yes  Other Topics Concern   Not on file  Social History Narrative   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         Social Determinants of Health   Financial Resource Strain: Low Risk    Difficulty of Paying Living Expenses: Not hard at all  Food Insecurity: No Food Insecurity   Worried About Charity fundraiser in the Last Year: Never true   [place] in the Last Year: Never true  Transportation Needs: No Transportation Needs   Lack of Transportation (Medical): No   Lack of Transportation (Non-Medical): No  Physical Activity: Sufficiently Active   Days of Exercise per Week: 7 days   Minutes  of Exercise per Session: 60 min  Stress: No Stress Concern Present   Feeling of Stress : Not at all  Social Connections: Socially Integrated   Frequency of Communication with Friends and Family: More than three times a week   Frequency of Social Gatherings with Friends and Family: More than three times a week   Attends Religious Services: More than 4 times per year   Active Member of [employer] or Organizations: Yes   Attends Archivist Meetings: 1 to 4 times per year   Marital Status: Married     Family History: The patient's family history includes Osteoporosis in her mother; Prostate cancer in an other family member.  ROS:   Please see the history of present illness.    All other systems reviewed and are negative.  EKGs/Labs/Other Studies Reviewed:    The following studies were reviewed today:  EP study  EKG:  The ekg ordered today demonstrates sinus rhythm.  No preexcitation.  Recent Labs: 08/21/2020: ALT 7; TSH 3.60 10/04/2020: Magnesium 2.0 01/10/2021: BUN 20; Creatinine, Ser 0.72; Hemoglobin 13.3; Platelets 220; Potassium 4.5; Sodium 141  Recent Lipid Panel    Component Value Date/Time   CHOL 138 08/21/2020 1226   TRIG 223.0 (H) 08/21/2020 1226   HDL 34.10 (L) 08/21/2020 1226   CHOLHDL 4 08/21/2020 1226   VLDL 44.6 (H) 08/21/2020 1226   LDLCALC 96 11/06/2017 0748   LDLDIRECT 88.0 08/21/2020 1226    Physical Exam:    VS:  BP 108/62    Pulse 60    Ht 4' 11.5" (1.511 m)    Wt 106 lb 6.4 oz (48.3 kg)    SpO2 98%    BMI 21.13 kg/m     Wt Readings from Last 3 Encounters:  03/20/21 106 lb 6.4 oz (48.3 kg)  02/04/21 104 lb (47.2 kg)  11/20/20 101 lb (45.8 kg)     GEN:  Well nourished, well developed in no acute distress HEENT: Normal NECK: No JVD; No carotid bruits LYMPHATICS: No lymphadenopathy CARDIAC: RRR, no murmurs, rubs, gallops RESPIRATORY:  Clear to auscultation without rales, wheezing or rhonchi  ABDOMEN: Soft, non-tender,  non-distended MUSCULOSKELETAL:  No edema; No deformity  SKIN: Warm and dry NEUROLOGIC:  Alert and oriented x 3 PSYCHIATRIC:  Normal affect        ASSESSMENT:    1. WPW (Wolff-Parkinson-White syndrome)   2. SVT (supraventricular tachycardia) (HCC)    PLAN:    In order of problems listed above:  #Wolff-Parkinson-White syndrome/SVT Doing well after an ablation of accessory pathway at 4:00 on the mitral valve. No recurrence of arrhythmia. No need for AV nodal blockers.  Follow-up with me as needed.  Medication Adjustments/Labs and Tests Ordered: Current medicines are reviewed at length with the patient today.  Concerns regarding medicines are outlined above.  Orders Placed This Encounter  Procedures   EKG 12-Lead   No orders of the defined types were placed in this encounter.    Signed, Lars Mage, MD, [facility], [facility] 03/20/2021 8:36 AM    Electrophysiology [facility]

## 2021-03-20 ENCOUNTER — Encounter: Payer: Self-pay | Admitting: Cardiology

## 2021-03-20 ENCOUNTER — Ambulatory Visit: Payer: Medicare PPO | Admitting: Cardiology

## 2021-03-20 ENCOUNTER — Other Ambulatory Visit: Payer: Self-pay

## 2021-03-20 VITALS — BP 108/62 | HR 60 | Ht 59.5 in | Wt 106.4 lb

## 2021-03-20 DIAGNOSIS — I456 Pre-excitation syndrome: Secondary | ICD-10-CM | POA: Diagnosis not present

## 2021-03-20 DIAGNOSIS — I471 Supraventricular tachycardia: Secondary | ICD-10-CM

## 2021-03-20 NOTE — Patient Instructions (Addendum)
Medication Instructions:  Your physician recommends that you continue on your current medications as directed. Please refer to the Current Medication list given to you today. *If you need a refill on your cardiac medications before your next appointment, please call your pharmacy*  Lab Work: None ordered. If you have labs (blood work) drawn today and your tests are completely normal, you will receive your results only by: MyChart Message (if you have MyChart) OR A paper copy in the mail If you have any lab test that is abnormal or we need to change your treatment, we will call you to review the results.  Testing/Procedures: None ordered.  Follow-Up: At [facility], you and your health needs are our priority.  As part of our continuing mission to provide you with exceptional heart care, we have created designated Provider Care Teams.  These Care Teams include your primary Cardiologist (physician) and Advanced Practice Providers (APPs -  Physician Assistants and Nurse Practitioners) who all work together to provide you with the care you need, when you need it.  Your next appointment:   Your physician wants you to follow-up in: as needed   

## 2021-03-28 DIAGNOSIS — R0989 Other specified symptoms and signs involving the circulatory and respiratory systems: Secondary | ICD-10-CM | POA: Diagnosis not present

## 2021-03-28 DIAGNOSIS — F458 Other somatoform disorders: Secondary | ICD-10-CM | POA: Diagnosis not present

## 2021-03-28 DIAGNOSIS — K5130 Ulcerative (chronic) rectosigmoiditis without complications: Secondary | ICD-10-CM | POA: Diagnosis not present

## 2021-03-28 DIAGNOSIS — Z79899 Other long term (current) drug therapy: Secondary | ICD-10-CM | POA: Diagnosis not present

## 2021-05-20 ENCOUNTER — Other Ambulatory Visit (HOSPITAL_COMMUNITY): Payer: Self-pay

## 2021-05-27 ENCOUNTER — Other Ambulatory Visit: Payer: Self-pay | Admitting: Family Medicine

## 2021-05-27 ENCOUNTER — Ambulatory Visit: Payer: Medicare PPO | Admitting: Family Medicine

## 2021-05-27 ENCOUNTER — Ambulatory Visit: Payer: Self-pay

## 2021-05-27 VITALS — BP 130/78 | Ht 59.5 in | Wt 101.0 lb

## 2021-05-27 DIAGNOSIS — M25561 Pain in right knee: Secondary | ICD-10-CM | POA: Diagnosis not present

## 2021-05-27 DIAGNOSIS — G8929 Other chronic pain: Secondary | ICD-10-CM | POA: Diagnosis not present

## 2021-05-27 MED ORDER — METHYLPREDNISOLONE ACETATE 40 MG/ML IJ SUSP
40.0000 mg | Freq: Once | INTRAMUSCULAR | Status: AC
  Administered 2021-05-27: 40 mg via INTRA_ARTICULAR

## 2021-05-27 MED ORDER — TRAMADOL HCL 50 MG PO TABS: 50.0000 mg | ORAL_TABLET | Freq: Four times a day (QID) | ORAL | 0 refills | Status: DC | PRN

## 2021-05-27 NOTE — Progress Notes (Signed)
PCP: [name], MD ? ?Subjective:  ? ?HPI: ?Patient is a 76 y.o. female here for right knee pain for two weeks. Patient with history of osteoarthritis of bilateral knees and pseudogout with recent left knee replacement in May 2022 presenting for evaluation of right knee pain for two weeks duration. She reports knee pain was sudden onset when she was trying to put on her shoes with her back against the wall and her hip externally rotated and knee flexed. She felt like her patella shifted over. Since then, has had ongoing knee pain that is most significant at night. She continues to walk and bike daily, 40 mins each and notes that the walking also exacerbates her knee pain. She took tramadol last night which seemed to help her pain. She notes she has been unable to actively extend the knee for several years, unrelated to current injury. ? ?Past Medical History:  ?Diagnosis Date  ? Arthritis   ? "knees, fingers", knees,  ? Colitis   ? Dysrhythmia   ? , WPW,SVT  ? GERD (gastroesophageal reflux disease)   ? Osteoporosis   ? Segmental colitis (HCC)   ? Ulcerative colitis   ? age 83  ? WPW (Wolff-Parkinson-White syndrome)   ? ? ?Current Outpatient Medications on File Prior to Visit  ?Medication Sig Dispense Refill  ? apixaban (ELIQUIS) 5 MG TABS tablet Take 1 tablet (5 mg total) by mouth 2 (two) times daily. (Patient not taking: Reported on 03/20/2021) 60 tablet 0  ? calcium carbonate (TUMS EX) 750 MG chewable tablet Chew 1 tablet by mouth daily as needed for heartburn.    ? Calcium-Magnesium-Zinc (CAL-MAG-ZINC PO) Take 1 tablet by mouth 2 (two) times a week.    ? Cholecalciferol (VITAMIN D3 PO) Take 1 tablet by mouth daily.    ? colchicine 0.6 MG tablet Take 0.6 mg by mouth daily as needed (gout). (Patient not taking: Reported on 03/20/2021)    ? famotidine-calcium carbonate-magnesium hydroxide (PEPCID COMPLETE) 10-800-165 MG chewable tablet Chew 1 tablet by mouth daily as needed (acid reflux).    ? mesalamine  (LIALDA) 1.2 G EC tablet Take 2.4 g by mouth 2 (two) times daily. Two tablets in the morning and Two tablets at bedtime    ? Methylcellulose, Laxative, (CITRUCEL PO) Take 0.5 tablets by mouth daily as needed (regularity). (Patient not taking: Reported on 03/20/2021)    ? Multiple Vitamins-Minerals (PRESERVISION AREDS 2 PO) Take 1 capsule by mouth 2 (two) times a week.    ? propranolol (INDERAL) 10 MG tablet Take 1-2 tablets at onset of SVT.  May take up to 40 mg (4 tabs) in one hour. (Patient not taking: Reported on 03/20/2021) 40 tablet 3  ? ?No current facility-administered medications on file prior to visit.  ? ? ?Past Surgical History:  ?Procedure Laterality Date  ? BREAST BIOPSY Right 02/22/1995  ? CATARACT EXTRACTION W/ INTRAOCULAR LENS  IMPLANT, BILATERAL Bilateral 2-05/2011  ? INGUINAL HERNIA REPAIR Right ~ 1981  ? ORIF HUMERUS FRACTURE Left 01/11/2013  ? Procedure: LEFT ORIF PROXIMAL HUMERUS FRACTURE;  Surgeon: Senaida Lange, MD;  Location: [facility] OR;  Service: Orthopedics;  Laterality: Left;  SAME DAY LABS  ? ORIF PROXIMAL HUMERUS FRACTURE Left 01/11/2013  ? SVT ABLATION N/A 02/04/2021  ? Procedure: SVT ABLATION;  Surgeon: Lanier Prude, MD;  Location: [facility] INVASIVE CV LAB;  Service: Cardiovascular;  Laterality: N/A;  ? TONSILLECTOMY  1948  ? TOTAL KNEE ARTHROPLASTY Left 07/23/2020  ? Procedure: TOTAL KNEE  ARTHROPLASTY;  Surgeon: Ollen Gross, MD;  Location: [facility] ORS;  Service: Orthopedics;  Laterality: Left;   ? TUBAL LIGATION  ~ 1985  ? ? ?Allergies  ?Allergen Reactions  ? Sulfonamide Derivatives Rash  ? Sulfa Antibiotics Rash  ? Nsaids Other (See Comments)  ?  Contraindicated with lialda  ? Penicillins Rash  ?  Tolerated Cephalosporin Date: 07/24/20. ? ?  ? ? ?BP 130/78   Ht 4' 11.5" (1.511 m)   Wt 101 lb (45.8 kg)   BMI 20.06 kg/m?  ? ?[facility] 05/27/2021  ?Frequency of aerobic exercise (# of days/week) 5  ?Average time in minutes 45  ?Frequency of strengthening  activities (# of days/week) 3  ? ? ?No flowsheet data found. ? ?    ?Objective:  ?Physical Exam: ? ?Gen: NAD, comfortable in exam room ?Right knee: No obvious effusion.  ? Swelling medially.  No other deformity or ecchymoses noted.  Patella located in femoral groove but with lateral tilt. ?Tenderness to palpation of medial joint line.  No other tenderness. ?ROM intact passively.  5/5 strength flexion, 2/5 extension - patient notes this is at baseline.   ?Negative anterior drawer, lachman or mcmurray test. Negative valgus and varus stress. ?NV intact distally. ? ?Limited MSK u/s right knee:  Patellar and quad tendons intact.  Minimal effusion.  Lateral patellar tilt but no dislocation.  Significant narrowing medial joint line. ? ?Assessment & Plan:  ?1. R knee arthritis  ?Patient with acute exacerbation of her osteoarthritis. Discussed steroid injection for which patient agreeable at this time.  ?After informed written consent timeout was performed, patient was seated on exam table. Right knee was prepped with alcohol swab and utilizing anteromedial approach, patient's right knee was injected intraarticularly with 3:1 lidocaine: depomedrol. Patient tolerated the procedure well without immediate complications. ?- Recommended for biking instead of walking for few days ?- Continue with tylenol as needed ?- Follow up in 4 weeks  ? ?Eliezer Bottom, MD ?Internal Medicine, PGY-3 ?05/27/21 1:17 PM ?Pager # [phone] ? ? ?

## 2021-05-27 NOTE — Patient Instructions (Signed)
Your pain is due to arthritis. ?These are the different medications you can take for this: ?Tylenol 500mg  1-2 tabs three times a day for pain. ?Capsaicin, aspercreme, or biofreeze topically up to four times a day may also help with pain. ?Some supplements that may help for arthritis: Boswellia extract, curcumin, pycnogenol ?Cortisone injections are an option - you were given this today. ?If cortisone injections do not help, there are different types of shots that may help but they take longer to take effect. ?It's important that you continue to stay active. ?Straight leg raises, knee extensions 3 sets of 10 once a day (add ankle weight if these become too easy). ?Consider physical therapy to strengthen muscles around the joint that hurts to take pressure off of the joint itself. ?Shoe inserts with good arch support may be helpful. ?Heat or ice 15 minutes at a time 3-4 times a day as needed to help with pain. ?Water aerobics and cycling with low resistance are the best two types of exercise for arthritis though any exercise is ok as long as it doesn't worsen the pain. ?Follow up with me in 1 month or as needed.  ?Have a safe trip! ?

## 2021-05-28 ENCOUNTER — Encounter: Payer: Self-pay | Admitting: Family Medicine

## 2021-07-01 ENCOUNTER — Other Ambulatory Visit (HOSPITAL_COMMUNITY): Payer: Self-pay

## 2021-07-08 DIAGNOSIS — H20012 Primary iridocyclitis, left eye: Secondary | ICD-10-CM | POA: Diagnosis not present

## 2021-07-08 DIAGNOSIS — S0502XA Injury of conjunctiva and corneal abrasion without foreign body, left eye, initial encounter: Secondary | ICD-10-CM | POA: Diagnosis not present

## 2021-07-10 DIAGNOSIS — S0502XD Injury of conjunctiva and corneal abrasion without foreign body, left eye, subsequent encounter: Secondary | ICD-10-CM | POA: Diagnosis not present

## 2021-07-16 DIAGNOSIS — K6289 Other specified diseases of anus and rectum: Secondary | ICD-10-CM | POA: Diagnosis not present

## 2021-07-16 DIAGNOSIS — S0502XD Injury of conjunctiva and corneal abrasion without foreign body, left eye, subsequent encounter: Secondary | ICD-10-CM | POA: Diagnosis not present

## 2021-07-16 DIAGNOSIS — R0989 Other specified symptoms and signs involving the circulatory and respiratory systems: Secondary | ICD-10-CM | POA: Diagnosis not present

## 2021-07-16 DIAGNOSIS — F458 Other somatoform disorders: Secondary | ICD-10-CM | POA: Diagnosis not present

## 2021-07-16 DIAGNOSIS — K5120 Ulcerative (chronic) proctitis without complications: Secondary | ICD-10-CM | POA: Diagnosis not present

## 2021-07-16 DIAGNOSIS — Z791 Long term (current) use of non-steroidal anti-inflammatories (NSAID): Secondary | ICD-10-CM | POA: Diagnosis not present

## 2021-08-14 DIAGNOSIS — M1711 Unilateral primary osteoarthritis, right knee: Secondary | ICD-10-CM | POA: Diagnosis not present

## 2021-08-14 DIAGNOSIS — Z96652 Presence of left artificial knee joint: Secondary | ICD-10-CM | POA: Diagnosis not present

## 2021-10-09 ENCOUNTER — Encounter: Payer: Self-pay | Admitting: Cardiology

## 2021-12-11 ENCOUNTER — Ambulatory Visit (INDEPENDENT_AMBULATORY_CARE_PROVIDER_SITE_OTHER): Payer: Medicare PPO

## 2021-12-11 ENCOUNTER — Ambulatory Visit: Payer: Medicare PPO

## 2021-12-11 VITALS — Ht 59.5 in | Wt 100.0 lb

## 2021-12-11 DIAGNOSIS — Z0000 Encounter for general adult medical examination without abnormal findings: Secondary | ICD-10-CM | POA: Diagnosis not present

## 2021-12-11 NOTE — Progress Notes (Signed)
Subjective:   Caitlin Hicks is a 76 y.o. female who presents for Medicare Annual (Subsequent) preventive examination.  Review of Systems    Virtual Visit via Telephone Note  I connected with  Caitlin Hicks on 12/11/21 at 10:45 AM EDT by telephone and verified that I am speaking with the correct person using two identifiers.  Location: Patient: Home Provider: Office Persons participating in the virtual visit: patient/Nurse Health Advisor   I discussed the limitations, risks, security and privacy concerns of performing an evaluation and management service by telephone and the availability of in person appointments. The patient expressed understanding and agreed to proceed.  Interactive audio and video telecommunications were attempted between this nurse and patient, however failed, due to patient having technical difficulties OR patient did not have access to video capability.  We continued and completed visit with audio only.  Some vital signs may be absent or patient reported.   [name], LPN  Cardiac Risk Factors include: advanced age (>49men, >69 women)     Objective:    Today's Vitals   12/11/21 1043  Weight: 100 lb (45.4 kg)  Height: 4' 11.5" (1.511 m)   Body mass index is 19.86 kg/m.     12/11/2021   10:52 AM 02/04/2021    6:08 AM 11/28/2020   11:09 AM 07/23/2020    4:23 PM 07/23/2020    9:51 AM 07/16/2020   11:42 AM 11/23/2019    2:05 PM  Advanced Directives  Does Patient Have a Medical Advance Directive? Yes Yes Yes Yes Yes Yes Yes  Type of Estate agent of [place];Living will Healthcare Power of [place];Living will Healthcare Power of Attorney Living will;Healthcare Power of [employer] Power of [place];Living will Healthcare Power of [place];Living will Healthcare Power of [place];Living will  Does patient want to make changes to medical advance directive?    No - Patient declined No - Patient declined  No - Patient declined   Copy of Healthcare Power of Attorney in Chart? No - copy requested  No - copy requested No - copy requested No - copy requested  No - copy requested    Current Medications (verified) Outpatient Encounter Medications as of 12/11/2021  Medication Sig   apixaban (ELIQUIS) 5 MG TABS tablet Take 1 tablet (5 mg total) by mouth 2 (two) times daily. (Patient not taking: Reported on 03/20/2021)   calcium carbonate (TUMS EX) 750 MG chewable tablet Chew 1 tablet by mouth daily as needed for heartburn.   Calcium-Magnesium-Zinc (CAL-MAG-ZINC PO) Take 1 tablet by mouth 2 (two) times a week.   Cholecalciferol (VITAMIN D3 PO) Take 1 tablet by mouth daily.   colchicine 0.6 MG tablet Take 0.6 mg by mouth daily as needed (gout). (Patient not taking: Reported on 03/20/2021)   famotidine-calcium carbonate-magnesium hydroxide (PEPCID COMPLETE) 10-800-165 MG chewable tablet Chew 1 tablet by mouth daily as needed (acid reflux).   mesalamine (LIALDA) 1.2 G EC tablet Take 2.4 g by mouth 2 (two) times daily. Two tablets in the morning and Two tablets at bedtime   Methylcellulose, Laxative, (CITRUCEL PO) Take 0.5 tablets by mouth daily as needed (regularity). (Patient not taking: Reported on 03/20/2021)   Multiple Vitamins-Minerals (PRESERVISION AREDS 2 PO) Take 1 capsule by mouth 2 (two) times a week.   propranolol (INDERAL) 10 MG tablet Take 1-2 tablets at onset of SVT.  May take up to 40 mg (4 tabs) in one hour. (Patient not taking: Reported on 03/20/2021)   traMADol (  ULTRAM) 50 MG tablet Take 1 tablet (50 mg total) by mouth every 6 (six) hours as needed.   No facility-administered encounter medications on file as of 12/11/2021.    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[place])    Ulcerative colitis    age 37   WPW (Wolff-Parkinson-White syndrome)    Past Surgical History:  Procedure Laterality Date   BREAST BIOPSY Right 02/22/1995   CATARACT EXTRACTION W/ INTRAOCULAR LENS  IMPLANT, BILATERAL Bilateral 2-05/2011   INGUINAL HERNIA REPAIR Right ~ 1981   ORIF HUMERUS FRACTURE Left 01/11/2013   Procedure: LEFT ORIF PROXIMAL HUMERUS FRACTURE;  Surgeon: Marin Shutter, MD;  Location: [facility];  Service: Orthopedics;  Laterality: Left;  SAME DAY LABS   ORIF PROXIMAL HUMERUS FRACTURE Left 01/11/2013   SVT ABLATION N/A 02/04/2021   Procedure: SVT ABLATION;  Surgeon: Vickie Epley, MD;  Location: [facility];  Service: Cardiovascular;  Laterality: N/A;   TONSILLECTOMY  1948   TOTAL KNEE ARTHROPLASTY Left 07/23/2020   Procedure: TOTAL KNEE ARTHROPLASTY;  Surgeon: Gaynelle Arabian, MD;  Location: [facility];  Service: Orthopedics;  Laterality: Left;  44min   TUBAL LIGATION  ~ 1985   Family History  Problem Relation Age of Onset   Osteoporosis Mother    Prostate cancer Other    Social History   Socioeconomic History   Marital status: Married    Spouse name: Not on file   Number of children: Not on file   Years of education: Not on file   Highest education level: Not on file  Occupational History   Not on file  Tobacco Use   Smoking status: Never   Smokeless tobacco: Never  Vaping Use   Vaping Use: Never used  Substance and Sexual Activity   Alcohol use: Yes    Alcohol/week: 2.0 standard drinks of alcohol    Types: 2 Glasses of wine per week    Comment: 01/11/2013 "2-3 glasses of wine/wk; some weeks not even that"   Drug use: No   Sexual activity: Yes  Other Topics Concern   Not on file  Social History Narrative   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         Social Determinants of Health   Financial Resource Strain: Low Risk  (12/11/2021)   Overall Financial Resource Strain (CARDIA)    Difficulty of Paying Living Expenses: Not  hard at all  Food Insecurity: No Food Insecurity (12/11/2021)   Hunger Vital Sign    Worried About Running Out of Food in the Last Year: Never true    Ran Out of Food in the Last Year: Never true  Transportation Needs: No Transportation Needs (11/28/2020)   PRAPARE - Hydrologist (Medical): No    Lack of Transportation (Non-Medical): No  Physical Activity: Sufficiently Active (12/11/2021)   Exercise Vital Sign    Days of Exercise per Week: 7 days    Minutes of Exercise per Session: 30 min  Stress: No Stress Concern Present (12/11/2021)   [place]    Feeling of Stress : Not at all  Social Connections: [place] (12/11/2021)  Social Advertising account executiveConnection and Isolation Panel [NHANES]    Frequency of Communication with Friends and Family: More than three times a week    Frequency of Social Gatherings with Friends and Family: More than three times a week    Attends Religious Services: More than 4 times per year    Active Member of [employer] Financial[employer]ubs or Organizations: Yes    Attends Engineer, structuralClub or Organization Meetings: More than 4 times per year    Marital Status: Married    Tobacco Counseling Counseling given: Not Answered   Clinical Intake:  Pre-visit preparation completed: No  Pain : No/denies pain     BMI - recorded: 19.86 Nutritional Status: BMI of 19-24  Normal Diabetes: No  How often do you need to have someone help you when you read instructions, pamphlets, or other written materials from your doctor or pharmacy?: 1 - Never  Diabetic?  No  Interpreter Needed?: No  Information entered by :: Theresa MulliganBeverly Clemmie Marxen LPN   Activities of Daily Living    12/11/2021   10:50 AM  In your present state of health, do you have any difficulty performing the following activities:  Hearing? 0  Vision? 0  Difficulty concentrating or making decisions? 0  Walking or climbing stairs? 0  Dressing or bathing? 0   Doing errands, shopping? 0  Preparing Food and eating ? N  Using the Toilet? N  In the past six months, have you accidently leaked urine? N  Do you have problems with loss of bowel control? N  Managing your Medications? N  Managing your Finances? N  Housekeeping or managing your Housekeeping? N    Patient Care Team: Kristian CoveyBurchette, Bruce W, MD as PCP - General Lanier PrudeLambert, Cameron T, MD as PCP - Electrophysiology (Cardiology) Antony ContrasLyles, Graham, MD as Consulting Physician (Ophthalmology)  Indicate any recent Medical Services you may have received from other than [facility] providers in the past year (date may be approximate).     Assessment:   This is a routine wellness examination for [name].  Hearing/Vision screen Hearing Screening - Comments:: Denies hearing difficulties   Vision Screening - Comments:: Wears reading glasses - up to date with routine eye exams with  Dr Randon GoldsmithLyles  Dietary issues and exercise activities discussed: Current Exercise Habits: Home exercise routine, Type of exercise: walking, Time (Minutes): 30, Frequency (Times/Week): 7, Weekly Exercise (Minutes/Week): 210, Intensity: Moderate, Exercise limited by: None identified   Goals Addressed               This Visit's Progress     Stay Healthy (pt-stated)        Live as long as I can while still being healthy.       Depression Screen    12/11/2021   10:48 AM 11/28/2020   11:07 AM 11/23/2019    2:08 PM 04/27/2019   10:14 AM 11/06/2017    7:19 AM 07/01/2016    8:01 AM 04/28/2016   10:39 AM  PHQ 2/9 Scores  PHQ - 2 Score 0 0 0 0 0 0 0  PHQ- 9 Score   0 0       Fall Risk    12/11/2021   10:50 AM 11/28/2020   11:11 AM 11/23/2019    2:07 PM 05/02/2019    9:01 AM 11/06/2017    7:19 AM  Fall Risk   Falls in the past year? 0 0 0 0 No  Number falls in past yr: 0 0 0    Injury with Fall? 0 0 0  Risk for fall due to : No Fall Risks Impaired vision No Fall Risks    Follow up Falls prevention discussed Falls prevention discussed  Falls evaluation completed;Falls prevention discussed      FALL RISK PREVENTION PERTAINING TO THE HOME:  Any stairs in or around the home? Yes  If so, are there any without handrails? No  Home free of loose throw rugs in walkways, pet beds, electrical cords, etc? Yes  Adequate lighting in your home to reduce risk of falls? Yes   ASSISTIVE DEVICES UTILIZED TO PREVENT FALLS:  Life alert? No  Use of a cane, walker or w/c? No  Grab bars in the bathroom? Yes  Shower chair or bench in shower? No  Elevated toilet seat or a handicapped toilet? No   TIMED UP AND GO:  Was the test performed? No . Audio Visit  Cognitive Function:        12/11/2021   10:52 AM 11/28/2020   11:15 AM  6CIT Screen  What Year? 0 points 0 points  What month? 0 points 0 points  What time? 0 points 0 points  Count back from 20 0 points 0 points  Months in reverse 0 points 0 points  Repeat phrase 0 points 0 points  Total Score 0 points 0 points    Immunizations Immunization History  Administered Date(s) Administered   Influenza Split 01/23/2011   Influenza Whole 12/22/2009, 01/03/2014   Influenza, High Dose Seasonal PF 02/07/2016, 01/22/2017, 01/08/2018   Influenza-Unspecified 01/05/2013, 12/23/2014, 01/07/2019, 01/04/2021   PFIZER(Purple Top)SARS-COV-2 Vaccination 04/13/2019, 05/04/2019, 12/23/2019, 08/16/2020   Pneumococcal Conjugate-13 12/09/2013   Pneumococcal Polysaccharide-23 06/28/2008   Tdap 12/09/2013   Zoster, Live 03/25/2007    TDAP status: Up to date  Flu Vaccine status: Up to date  Pneumococcal vaccine status: Up to date  Covid-19 vaccine status: Completed vaccines  Qualifies for Shingles Vaccine? Yes   Zostavax completed Yes  Shingrix Completed?: Yes  Screening Tests Health Maintenance  Topic Date Due   Zoster Vaccines- Shingrix (1 of 2) Never done   COVID-19 Vaccine (5 - Pfizer series) 12/27/2021 (Originally 10/11/2020)   INFLUENZA VACCINE  06/22/2022 (Originally 10/22/2021)    Hepatitis C Screening  12/12/2022 (Originally 05/24/1963)   TETANUS/TDAP  12/10/2023   Pneumonia Vaccine 53+ Years old  Completed   DEXA SCAN  Completed   HPV VACCINES  Aged Out   COLONOSCOPY (Pts 45-19yrs Insurance coverage will need to be confirmed)  Discontinued    Health Maintenance  Health Maintenance Due  Topic Date Due   Zoster Vaccines- Shingrix (1 of 2) Never done    Colorectal cancer screening: No longer required.   Mammogram status: No longer required due to Age.  Bone Density status: Completed 04/19/19. Results reflect: Bone density results: OSTEOPOROSIS. Repeat every   years.  Lung Cancer Screening: (Low Dose CT Chest recommended if Age 56-80 years, 30 pack-year currently smoking OR have quit w/in 15years.) does not qualify.     Additional Screening:  Hepatitis C Screening: does qualify; Completed Deferred  Vision Screening: Recommended annual ophthalmology exams for early detection of glaucoma and other disorders of the eye. Is the patient up to date with their annual eye exam?  Yes  Who is the provider or what is the name of the office in which the patient attends annual eye exams? Dr Randon Goldsmith If pt is not established with a provider, would they like to be referred to a provider to establish care? No .   Dental Screening: Recommended  annual dental exams for proper oral hygiene  Community Resource Referral / Chronic Care Management:  CRR required this visit?  No   CCM required this visit?  No      Plan:     I have personally reviewed and noted the following in the patient's chart:   Medical and social history Use of alcohol, tobacco or illicit drugs  Current medications and supplements including opioid prescriptions. Patient is currently taking opioid prescriptions. Information provided to patient regarding non-opioid alternatives. Patient advised to discuss non-opioid treatment plan with their provider. Functional ability and status Nutritional  status Physical activity Advanced directives List of other physicians Hospitalizations, surgeries, and ER visits in previous 12 months Vitals Screenings to include cognitive, depression, and falls Referrals and appointments  In addition, I have reviewed and discussed with patient certain preventive protocols, quality metrics, and best practice recommendations. A written personalized care plan for preventive services as well as general preventive health recommendations were provided to patient.     [name], LPN   07/30/3265   Nurse Notes: None

## 2021-12-11 NOTE — Patient Instructions (Addendum)
Caitlin Hicks , Thank you for taking time to come for your Medicare Wellness Visit. I appreciate your ongoing commitment to your health goals. Please review the following plan we discussed and let me know if I can assist you in the future.   Opioid Pain Medicine Management Opioids are powerful medicines that are used to treat moderate to severe pain. When used for short periods of time, they can help you to: Sleep better. Do better in physical or occupational therapy. Feel better in the first few days after an injury. Recover from surgery. Opioids should be taken with the supervision of a trained health care provider. They should be taken for the shortest period of time possible. This is because opioids can be addictive, and the longer you take opioids, the greater your risk of addiction. This addiction can also be called opioid use disorder. What are the risks? Using opioid pain medicines for longer than 3 days increases your risk of side effects. Side effects include: Constipation. Nausea and vomiting. Breathing difficulties (respiratory depression). Drowsiness. Confusion. Opioid use disorder. Itching. Taking opioid pain medicine for a long period of time can affect your ability to do daily tasks. It also puts you at risk for: Motor vehicle crashes. Depression. Suicide. Heart attack. Overdose, which can be life-threatening. What is a pain treatment plan? A pain treatment plan is an agreement between you and your health care provider. Pain is unique to each person, and treatments vary depending on your condition. To manage your pain, you and your health care provider need to work together. To help you do this: Discuss the goals of your treatment, including how much pain you might expect to have and how you will manage the pain. Review the risks and benefits of taking opioid medicines. Remember that a good treatment plan uses more than one approach and minimizes the chance of side  effects. Be honest about the amount of medicines you take and about any drug or alcohol use. Get pain medicine prescriptions from only one health care provider. Pain can be managed with many types of alternative treatments. Ask your health care provider to refer you to one or more specialists who can help you manage pain through: Physical or occupational therapy. Counseling (cognitive behavioral therapy). Good nutrition. Biofeedback. Massage. Meditation. Non-opioid medicine. Following a gentle exercise program. How to use opioid pain medicine Taking medicine Take your pain medicine exactly as told by your health care provider. Take it only when you need it. If your pain gets less severe, you may take less than your prescribed dose if your health care provider approves. If you are not having pain, do nottake pain medicine unless your health care provider tells you to take it. If your pain is severe, do nottry to treat it yourself by taking more pills than instructed on your prescription. Contact your health care provider for help. Write down the times when you take your pain medicine. It is easy to become confused while on pain medicine. Writing the time can help you avoid overdose. Take other over-the-counter or prescription medicines only as told by your health care provider. Keeping yourself and others safe  While you are taking opioid pain medicine: Do not drive, use machinery, or power tools. Do not sign legal documents. Do not drink alcohol. Do not take sleeping pills. Do not supervise children by yourself. Do not do activities that require climbing or being in high places. Do not go to a lake, river, ocean, spa, or swimming pool.  Do not share your pain medicine with anyone. Keep pain medicine in a locked cabinet or in a secure area where pets and children cannot reach it. Stopping your use of opioids If you have been taking opioid medicine for more than a few weeks, you may  need to slowly decrease (taper) how much you take until you stop completely. Tapering your use of opioids can decrease your risk of symptoms of withdrawal, such as: Pain and cramping in the abdomen. Nausea. Sweating. Sleepiness. Restlessness. Uncontrollable shaking (tremors). Cravings for the medicine. Do not attempt to taper your use of opioids on your own. Talk with your health care provider about how to do this. Your health care provider may prescribe a step-down schedule based on how much medicine you are taking and how long you have been taking it. Getting rid of leftover pills Do not save any leftover pills. Get rid of leftover pills safely by: Taking the medicine to a prescription take-back program. This is usually offered by the county or law enforcement. Bringing them to a pharmacy that has a drug disposal container. Flushing them down the toilet. Check the label or package insert of your medicine to see whether this is safe to do. Throwing them out in the trash. Check the label or package insert of your medicine to see whether this is safe to do. If it is safe to throw it out, remove the medicine from the original container, put it into a sealable bag or container, and mix it with used coffee grounds, food scraps, dirt, or cat litter before putting it in the trash. Follow these instructions at home: Activity Do exercises as told by your health care provider. Avoid activities that make your pain worse. Return to your normal activities as told by your health care provider. Ask your health care provider what activities are safe for you. General instructions You may need to take these actions to prevent or treat constipation: Drink enough fluid to keep your urine pale yellow. Take over-the-counter or prescription medicines. Eat foods that are high in fiber, such as beans, whole grains, and fresh fruits and vegetables. Limit foods that are high in fat and processed sugars, such as  fried or sweet foods. Keep all follow-up visits. This is important. Where to find support If you have been taking opioids for a long time, you may benefit from receiving support for quitting from a local support group or counselor. Ask your health care provider for a referral to these resources in your area. Where to find more information Centers for Disease Control and Prevention (CDC): http://www.wolf.info/ [place] Food and Drug Administration (FDA): [place] Get help right away if: You may have taken too much of an opioid (overdosed). Common symptoms of an overdose: Your breathing is slower or more shallow than normal. You have a very slow heartbeat (pulse). You have slurred speech. You have nausea and vomiting. Your pupils become very small. You have other potential symptoms: You are very confused. You faint or feel like you will faint. You have cold, clammy skin. You have blue lips or fingernails. You have thoughts of harming yourself or harming others. These symptoms may represent a serious problem that is an emergency. Do not wait to see if the symptoms will go away. Get medical help right away. Call your local emergency services (911 in the [place] Do not drive yourself to the hospital.  If you ever feel like you may hurt yourself or others, or have thoughts about taking your  own life, get help right away. Go to your nearest emergency department or: Call your local emergency services (911 in the [place] Call the [facility] 660 265 4257 in the [place] Call a suicide crisis helpline, such as the [place] at 316 667 3536 or 988 in the [place]. This is open 24 hours a day in the [place] Text the Crisis Text Line at 804-754-7525 (in the [place] Summary Opioid medicines can help you manage moderate to severe pain for a short period of time. A pain treatment plan is an agreement between you and your health care provider. Discuss the goals of your treatment,  including how much pain you might expect to have and how you will manage the pain. If you think that you or someone else may have taken too much of an opioid, get medical help right away. This information is not intended to replace advice given to you by your health care provider. Make sure you discuss any questions you have with your health care provider. Document Revised: 10/03/2020 Document Reviewed: 06/20/2020 Elsevier Patient Education  [place].   Screening recommendations/referrals: Colonoscopy: Not required Mammogram: Not required Bone Density: Done Recommended yearly ophthalmology/optometry visit for glaucoma screening and checkup Recommended yearly dental visit for hygiene and checkup  Vaccinations: Influenza vaccine: Up to date Pneumococcal vaccine: Up to date Tdap vaccine: Up to date Shingles vaccine: Done per patient   Covid-19:Done  Advanced directives: Please bring a copy of your health care power of attorney and living will to the office to be added to your chart at your convenience.   Conditions/risks identified: None  Next appointment: Follow up in one year for your annual wellness visit    Preventive Care 65 Years and Older, Female Preventive care refers to lifestyle choices and visits with your health care provider that can promote health and wellness. What does preventive care include? A yearly physical exam. This is also called an annual well check. Dental exams once or twice a year. Routine eye exams. Ask your health care provider how often you should have your eyes checked. Personal lifestyle choices, including: Daily care of your teeth and gums. Regular physical activity. Eating a healthy diet. Avoiding tobacco and drug use. Limiting alcohol use. Practicing safe sex. Taking low-dose aspirin every day. Taking vitamin and mineral supplements as recommended by your health care provider. What happens during an annual well check? The services  and screenings done by your health care provider during your annual well check will depend on your age, overall health, lifestyle risk factors, and family history of disease. Counseling  Your health care provider may ask you questions about your: Alcohol use. Tobacco use. Drug use. Emotional well-being. Home and relationship well-being. Sexual activity. Eating habits. History of falls. Memory and ability to understand (cognition). Work and work Statistician. Reproductive health. Screening  You may have the following tests or measurements: Height, weight, and BMI. Blood pressure. Lipid and cholesterol levels. These may be checked every 5 years, or more frequently if you are over 66 years old. Skin check. Lung cancer screening. You may have this screening every year starting at age 63 if you have a 30-pack-year history of smoking and currently smoke or have quit within the past 15 years. Fecal occult blood test (FOBT) of the stool. You may have this test every year starting at age 54. Flexible sigmoidoscopy or colonoscopy. You may have a sigmoidoscopy every 5 years or a colonoscopy every 10 years starting at age 67. Hepatitis  C blood test. Hepatitis B blood test. Sexually transmitted disease (STD) testing. Diabetes screening. This is done by checking your blood sugar (glucose) after you have not eaten for a while (fasting). You may have this done every 1-3 years. Bone density scan. This is done to screen for osteoporosis. You may have this done starting at age 35. Mammogram. This may be done every 1-2 years. Talk to your health care provider about how often you should have regular mammograms. Talk with your health care provider about your test results, treatment options, and if necessary, the need for more tests. Vaccines  Your health care provider may recommend certain vaccines, such as: Influenza vaccine. This is recommended every year. Tetanus, diphtheria, and acellular pertussis  (Tdap, Td) vaccine. You may need a Td booster every 10 years. Zoster vaccine. You may need this after age 54. Pneumococcal 13-valent conjugate (PCV13) vaccine. One dose is recommended after age 80. Pneumococcal polysaccharide (PPSV23) vaccine. One dose is recommended after age 61. Talk to your health care provider about which screenings and vaccines you need and how often you need them. This information is not intended to replace advice given to you by your health care provider. Make sure you discuss any questions you have with your health care provider. Document Released: 04/06/2015 Document Revised: 11/28/2015 Document Reviewed: 01/09/2015 Elsevier Interactive Patient Education  2017 [place] Prevention in the Home Falls can cause injuries. They can happen to people of all ages. There are many things you can do to make your home safe and to help prevent falls. What can I do on the outside of my home? Regularly fix the edges of walkways and driveways and fix any cracks. Remove anything that might make you trip as you walk through a door, such as a raised step or threshold. Trim any bushes or trees on the path to your home. Use bright outdoor lighting. Clear any walking paths of anything that might make someone trip, such as rocks or tools. Regularly check to see if handrails are loose or broken. Make sure that both sides of any steps have handrails. Any raised decks and porches should have guardrails on the edges. Have any leaves, snow, or ice cleared regularly. Use sand or salt on walking paths during winter. Clean up any spills in your garage right away. This includes oil or grease spills. What can I do in the bathroom? Use night lights. Install grab bars by the toilet and in the tub and shower. Do not use towel bars as grab bars. Use non-skid mats or decals in the tub or shower. If you need to sit down in the shower, use a plastic, non-slip stool. Keep the floor dry. Clean  up any water that spills on the floor as soon as it happens. Remove soap buildup in the tub or shower regularly. Attach bath mats securely with double-sided non-slip rug tape. Do not have throw rugs and other things on the floor that can make you trip. What can I do in the bedroom? Use night lights. Make sure that you have a light by your bed that is easy to reach. Do not use any sheets or blankets that are too big for your bed. They should not hang down onto the floor. Have a firm chair that has side arms. You can use this for support while you get dressed. Do not have throw rugs and other things on the floor that can make you trip. What can I do in the  kitchen? Clean up any spills right away. Avoid walking on wet floors. Keep items that you use a lot in easy-to-reach places. If you need to reach something above you, use a strong step stool that has a grab bar. Keep electrical cords out of the way. Do not use floor polish or wax that makes floors slippery. If you must use wax, use non-skid floor wax. Do not have throw rugs and other things on the floor that can make you trip. What can I do with my stairs? Do not leave any items on the stairs. Make sure that there are handrails on both sides of the stairs and use them. Fix handrails that are broken or loose. Make sure that handrails are as long as the stairways. Check any carpeting to make sure that it is firmly attached to the stairs. Fix any carpet that is loose or worn. Avoid having throw rugs at the top or bottom of the stairs. If you do have throw rugs, attach them to the floor with carpet tape. Make sure that you have a light switch at the top of the stairs and the bottom of the stairs. If you do not have them, ask someone to add them for you. What else can I do to help prevent falls? Wear shoes that: Do not have high heels. Have rubber bottoms. Are comfortable and fit you well. Are closed at the toe. Do not wear sandals. If you  use a stepladder: Make sure that it is fully opened. Do not climb a closed stepladder. Make sure that both sides of the stepladder are locked into place. Ask someone to hold it for you, if possible. Clearly mark and make sure that you can see: Any grab bars or handrails. First and last steps. Where the edge of each step is. Use tools that help you move around (mobility aids) if they are needed. These include: Canes. Walkers. Scooters. Crutches. Turn on the lights when you go into a dark area. Replace any light bulbs as soon as they burn out. Set up your furniture so you have a clear path. Avoid moving your furniture around. If any of your floors are uneven, fix them. If there are any pets around you, be aware of where they are. Review your medicines with your doctor. Some medicines can make you feel dizzy. This can increase your chance of falling. Ask your doctor what other things that you can do to help prevent falls. This information is not intended to replace advice given to you by your health care provider. Make sure you discuss any questions you have with your health care provider. Document Released: 01/04/2009 Document Revised: 08/16/2015 Document Reviewed: 04/14/2014 Elsevier Interactive Patient Education  2017 [employer].

## 2021-12-25 ENCOUNTER — Ambulatory Visit: Payer: Medicare PPO | Admitting: Adult Health

## 2021-12-25 VITALS — BP 120/70 | HR 61 | Temp 97.6°F | Wt 103.2 lb

## 2021-12-25 DIAGNOSIS — L247 Irritant contact dermatitis due to plants, except food: Secondary | ICD-10-CM

## 2021-12-25 MED ORDER — METHYLPREDNISOLONE ACETATE 40 MG/ML IJ SUSP
40.0000 mg | Freq: Once | INTRAMUSCULAR | Status: AC
  Administered 2021-12-25: 40 mg via INTRAMUSCULAR

## 2021-12-25 MED ORDER — METHYLPREDNISOLONE ACETATE 80 MG/ML IJ SUSP
80.0000 mg | Freq: Once | INTRAMUSCULAR | Status: AC
  Administered 2021-12-25: 80 mg via INTRAMUSCULAR

## 2021-12-25 NOTE — Progress Notes (Addendum)
Subjective:    Patient ID: Caitlin Hicks, female    DOB: 1945-05-13, 76 y.o.   MRN: [id number]  HPI 76 year old female who  has a past medical history of Arthritis, Colitis, Dysrhythmia, GERD (gastroesophageal reflux disease), Osteoporosis, Segmental colitis (HCC), Ulcerative colitis, and WPW (Wolff-Parkinson-White syndrome).  She presents to the office today for an acute issue. She reports coming in contact with poison ivy six days ago. She has a red itchy rash on both arms and face. At home she has been using topical hydrocortisone cream and topical benadryl for itching.  The benadryl topical works to help with the itching.    Review of Systems See HPI   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HCC)    Ulcerative colitis    age 78   WPW (Wolff-Parkinson-White syndrome)     Social History   Socioeconomic History   Marital status: Married    Spouse name: Not on file   Number of children: Not on file   Years of education: Not on file   Highest education level: Not on file  Occupational History   Not on file  Tobacco Use   Smoking status: Never   Smokeless tobacco: Never  Vaping Use   Vaping Use: Never used  Substance and Sexual Activity   Alcohol use: Yes    Alcohol/week: 2.0 standard drinks of alcohol    Types: 2 Glasses of wine per week    Comment: 01/11/2013 "2-3 glasses of wine/wk; some weeks not even that"   Drug use: No   Sexual activity: Yes  Other Topics Concern   Not on file  Social History Narrative   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         Social Determinants of Health   Financial Resource Strain: Low Risk  (12/11/2021)   Overall Financial Resource Strain (CARDIA)    Difficulty of Paying Living Expenses: Not hard at all  Food Insecurity: No Food Insecurity (12/11/2021)   Hunger Vital Sign     Worried About Running Out of Food in the Last Year: Never true    Ran Out of Food in the Last Year: Never true  Transportation Needs: No Transportation Needs (11/28/2020)   PRAPARE - Administrator, Civil Service (Medical): No    Lack of Transportation (Non-Medical): No  Physical Activity: Sufficiently Active (12/11/2021)   Exercise Vital Sign    Days of Exercise per Week: 7 days    Minutes of Exercise per Session: 30 min  Stress: No Stress Concern Present (12/11/2021)   [employer] of Occupational Health - Occupational Stress Questionnaire    Feeling of Stress : Not at all  Social Connections: Socially Integrated (12/11/2021)   Social Connection and Isolation Panel [NHANES]    Frequency of Communication with Friends and Family: More than three times a week    Frequency of Social Gatherings with Friends and Family: More than three times a week    Attends Religious Services: More than 4 times per year    Active Member of [employer] or Organizations: Yes    Attends Banker Meetings: More than 4 times per year    Marital Status: Married  Catering manager Violence: Not At Risk (12/11/2021)   Humiliation, Afraid, Rape,  and Kick questionnaire    Fear of Current or Ex-Partner: No    Emotionally Abused: No    Physically Abused: No    Sexually Abused: No    Past Surgical History:  Procedure Laterality Date   BREAST BIOPSY Right 02/22/1995   CATARACT EXTRACTION W/ INTRAOCULAR LENS  IMPLANT, BILATERAL Bilateral 2-05/2011   INGUINAL HERNIA REPAIR Right ~ 1981   ORIF HUMERUS FRACTURE Left 01/11/2013   Procedure: LEFT ORIF PROXIMAL HUMERUS FRACTURE;  Surgeon: Senaida Lange, MD;  Location: [facility] OR;  Service: Orthopedics;  Laterality: Left;  SAME DAY LABS   ORIF PROXIMAL HUMERUS FRACTURE Left 01/11/2013   SVT ABLATION N/A 02/04/2021   Procedure: SVT ABLATION;  Surgeon: Lanier Prude, MD;  Location: [facility] INVASIVE CV LAB;  Service: Cardiovascular;  Laterality: N/A;    TONSILLECTOMY  1948   TOTAL KNEE ARTHROPLASTY Left 07/23/2020   Procedure: TOTAL KNEE ARTHROPLASTY;  Surgeon: Ollen Gross, MD;  Location: [facility] ORS;  Service: Orthopedics;  Laterality: Left;    TUBAL LIGATION  ~ 1985    Family History  Problem Relation Age of Onset   Osteoporosis Mother    Prostate cancer Other     Allergies  Allergen Reactions   Sulfonamide Derivatives Rash   Sulfa Antibiotics Rash   Nsaids Other (See Comments)    Contraindicated with lialda   Penicillins Rash    Tolerated Cephalosporin Date: 07/24/20.      Current Outpatient Medications on File Prior to Visit  Medication Sig Dispense Refill   calcium carbonate (TUMS EX) 750 MG chewable tablet Chew 1 tablet by mouth daily as needed for heartburn.     Calcium-Magnesium-Zinc (CAL-MAG-ZINC PO) Take 1 tablet by mouth 2 (two) times a week.     famotidine-calcium carbonate-magnesium hydroxide (PEPCID COMPLETE) [phone] MG chewable tablet Chew 1 tablet by mouth daily as needed (acid reflux).     mesalamine (LIALDA) 1.2 G EC tablet Take 2.4 g by mouth 2 (two) times daily. Two tablets in the morning and Two tablets at bedtime     Multiple Vitamins-Minerals (PRESERVISION AREDS 2 PO) Take 1 capsule by mouth 2 (two) times a week.     colchicine 0.6 MG tablet Take 0.6 mg by mouth daily as needed (gout). (Patient not taking: Reported on 12/25/2021)     No current facility-administered medications on file prior to visit.    BP 120/70 (BP Location: Left Arm, Patient Position: Sitting, Cuff Size: Normal)   Pulse 61   Temp 97.6 F (36.4 C) (Oral)   Wt 103 lb 3.2 oz (46.8 kg)   SpO2 98%   BMI 20.50 kg/m       Objective:   Physical Exam Vitals and nursing note reviewed.  Skin:    General: Skin is warm and dry.     Capillary Refill: Capillary refill takes less than 2 seconds.     Findings: Rash present.     Comments: Red raised vascular rash noted on bilateral arms R>L and on right cheek  Neurological:      General: No focal deficit present.     Mental Status: She is oriented to person, place, and time.  Psychiatric:        Mood and Affect: Mood normal.        Behavior: Behavior normal.        Thought Content: Thought content normal.        Judgment: Judgment normal.       Assessment & Plan:  1. Irritant  contact dermatitis due to plants, except food - Continue with topical benadryl. She did not want anything stronger for itching.  - Follow up as needed - methylPREDNISolone acetate (DEPO-MEDROL) injection 80 mg - methylPREDNISolone acetate (DEPO-MEDROL) injection 40 mg  Shirline Frees, NP

## 2021-12-26 ENCOUNTER — Telehealth: Payer: Self-pay | Admitting: Family Medicine

## 2021-12-26 NOTE — Telephone Encounter (Signed)
Called patient message complete. Pt stated that she spoke with Tommi Rumps

## 2021-12-26 NOTE — Telephone Encounter (Signed)
Pt is calling and would like a callback from cory personally concerning her visit yesterday

## 2022-01-02 DIAGNOSIS — H524 Presbyopia: Secondary | ICD-10-CM | POA: Diagnosis not present

## 2022-01-02 DIAGNOSIS — H31003 Unspecified chorioretinal scars, bilateral: Secondary | ICD-10-CM | POA: Diagnosis not present

## 2022-01-02 DIAGNOSIS — H52202 Unspecified astigmatism, left eye: Secondary | ICD-10-CM | POA: Diagnosis not present

## 2022-01-02 DIAGNOSIS — Z961 Presence of intraocular lens: Secondary | ICD-10-CM | POA: Diagnosis not present

## 2022-01-28 DIAGNOSIS — K5130 Ulcerative (chronic) rectosigmoiditis without complications: Secondary | ICD-10-CM | POA: Diagnosis not present

## 2022-01-28 DIAGNOSIS — R09A2 Foreign body sensation, throat: Secondary | ICD-10-CM | POA: Diagnosis not present

## 2022-01-30 ENCOUNTER — Encounter: Payer: Self-pay | Admitting: Family Medicine

## 2022-01-31 ENCOUNTER — Encounter: Payer: Self-pay | Admitting: Family Medicine

## 2022-01-31 ENCOUNTER — Ambulatory Visit (INDEPENDENT_AMBULATORY_CARE_PROVIDER_SITE_OTHER): Payer: Medicare PPO | Admitting: Family Medicine

## 2022-01-31 VITALS — BP 126/70 | HR 60 | Temp 97.4°F | Ht 58.66 in | Wt 101.3 lb

## 2022-01-31 DIAGNOSIS — Z0000 Encounter for general adult medical examination without abnormal findings: Secondary | ICD-10-CM | POA: Diagnosis not present

## 2022-01-31 DIAGNOSIS — R6889 Other general symptoms and signs: Secondary | ICD-10-CM

## 2022-01-31 DIAGNOSIS — E785 Hyperlipidemia, unspecified: Secondary | ICD-10-CM

## 2022-01-31 LAB — CBC WITH DIFFERENTIAL/PLATELET
Basophils Absolute: 0 10*3/uL (ref 0.0–0.1)
Basophils Relative: 0.9 % (ref 0.0–3.0)
Eosinophils Absolute: 0.1 10*3/uL (ref 0.0–0.7)
Eosinophils Relative: 1.6 % (ref 0.0–5.0)
HCT: 41.3 % (ref 36.0–46.0)
Hemoglobin: 13.6 g/dL (ref 12.0–15.0)
Lymphocytes Relative: 24.6 % (ref 12.0–46.0)
Lymphs Abs: 1.1 10*3/uL (ref 0.7–4.0)
MCHC: 32.9 g/dL (ref 30.0–36.0)
MCV: 86 fl (ref 78.0–100.0)
Monocytes Absolute: 0.4 10*3/uL (ref 0.1–1.0)
Monocytes Relative: 9.1 % (ref 3.0–12.0)
Neutro Abs: 3 10*3/uL (ref 1.4–7.7)
Neutrophils Relative %: 63.8 % (ref 43.0–77.0)
Platelets: 223 10*3/uL (ref 150.0–400.0)
RBC: 4.8 Mil/uL (ref 3.87–5.11)
RDW: 15.5 % (ref 11.5–15.5)
WBC: 4.6 10*3/uL (ref 4.0–10.5)

## 2022-01-31 LAB — HEPATIC FUNCTION PANEL
ALT: 13 U/L (ref 0–35)
AST: 17 U/L (ref 0–37)
Albumin: 4.3 g/dL (ref 3.5–5.2)
Alkaline Phosphatase: 78 U/L (ref 39–117)
Bilirubin, Direct: 0.1 mg/dL (ref 0.0–0.3)
Total Bilirubin: 0.7 mg/dL (ref 0.2–1.2)
Total Protein: 6.4 g/dL (ref 6.0–8.3)

## 2022-01-31 LAB — TSH: TSH: 5.07 u[IU]/mL (ref 0.35–5.50)

## 2022-01-31 LAB — LIPID PANEL
Cholesterol: 195 mg/dL (ref 0–200)
HDL: 61.2 mg/dL (ref >39.00)
LDL Cholesterol: 111 mg/dL — ABNORMAL HIGH (ref 0–99)
NonHDL: 133.33
Total CHOL/HDL Ratio: 3
Triglycerides: 111 mg/dL (ref 0.0–149.0)
VLDL: 22.2 mg/dL (ref 0.0–40.0)

## 2022-01-31 LAB — BASIC METABOLIC PANEL
BUN: 20 mg/dL (ref 6–23)
CO2: 27 mEq/L (ref 19–32)
Calcium: 9.7 mg/dL (ref 8.4–10.5)
Chloride: 108 mEq/L (ref 96–112)
Creatinine, Ser: 0.81 mg/dL (ref 0.40–1.20)
GFR: 69.4 mL/min (ref >60.00)
Glucose, Bld: 90 mg/dL (ref 70–99)
Potassium: 4.2 mEq/L (ref 3.5–5.1)
Sodium: 140 mEq/L (ref 135–145)

## 2022-01-31 NOTE — Progress Notes (Signed)
Established Patient Office Visit  Subjective   Patient ID: Caitlin Hicks, female    DOB: 12-22-1945  Age: 76 y.o. MRN: [id number]  Chief Complaint  Patient presents with   Annual Exam    HPI   Caitlin Hicks is here for physical exam.  Generally doing well.  She continues to exercise regularly with walking and some resistance exercises with weights couple days per week.  Her main concern recently has been her husband's health.  He has had some unexplained fevers that are challenging to evaluate.  [name] has history of Wolff-Parkinson-White syndrome and had ablation procedure last year which is gone well.  No recent palpitations.  She does have history of osteoporosis but declines further therapies.  She declines further DEXA scans.  She also has history of ulcerative colitis and pseudogout.  Health maintenance reviewed  -Flu vaccine already given -Last DEXA scan 2021.  She declines further -She is in process of setting up repeat colonoscopy -Tetanus due 2025 -We discussed Shingrix vaccine but she declines at this time.  Family history-father lived to be 50 and cause of death uncertain.  He did have prostate cancer.  Mother lived to age 72.  She had history of osteoporosis and onset of dementia around age 76.  She has a brother who is 30 who is generally fairly healthy  Social history-retired Writer.  She is married and lives with her husband who is a retired Network engineer from [employer] of [employer].  She has 2 children.  1 son is a Chief Executive Officer and the other is a Industrial/product designer.  She has never smoked.  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[place])    Ulcerative colitis    age 70   WPW (Wolff-Parkinson-White syndrome)    Past Surgical History:  Procedure Laterality Date   BREAST BIOPSY Right 02/22/1995   CATARACT EXTRACTION W/  INTRAOCULAR LENS  IMPLANT, BILATERAL Bilateral 2-05/2011   INGUINAL HERNIA REPAIR Right ~ 1981   ORIF HUMERUS FRACTURE Left 01/11/2013   Procedure: LEFT ORIF PROXIMAL HUMERUS FRACTURE;  Surgeon: Marin Shutter, MD;  Location: [facility];  Service: Orthopedics;  Laterality: Left;  SAME DAY LABS   ORIF PROXIMAL HUMERUS FRACTURE Left 01/11/2013   SVT ABLATION N/A 02/04/2021   Procedure: SVT ABLATION;  Surgeon: Vickie Epley, MD;  Location: [facility];  Service: Cardiovascular;  Laterality: N/A;   TONSILLECTOMY  1948   TOTAL KNEE ARTHROPLASTY Left 07/23/2020   Procedure: TOTAL KNEE ARTHROPLASTY;  Surgeon: Gaynelle Arabian, MD;  Location: [facility];  Service: Orthopedics;  Laterality: Left;  66min   TUBAL LIGATION  ~ 1985    reports that she has never smoked. She has never used smokeless tobacco. She reports current alcohol use of about 2.0 standard drinks of alcohol per week. She reports that she does not use drugs. family history includes Osteoporosis in her mother; Prostate cancer in an other family member. Allergies  Allergen Reactions   Sulfonamide Derivatives Rash   Sulfa Antibiotics Rash   Nsaids Other (See Comments)    Contraindicated with lialda   Penicillins Rash    Tolerated Cephalosporin Date: 07/24/20.      Review of Systems  Constitutional:  Negative for chills, fever, malaise/fatigue and weight loss.  HENT:  Negative for hearing loss.  Eyes:  Negative for blurred vision and double vision.  Respiratory:  Negative for cough and shortness of breath.   Cardiovascular:  Negative for chest pain, palpitations and leg swelling.  Gastrointestinal:  Negative for abdominal pain, blood in stool, constipation and diarrhea.  Genitourinary:  Negative for dysuria.  Skin:  Negative for rash.  Neurological:  Negative for dizziness, speech change, seizures, loss of consciousness and headaches.  Psychiatric/Behavioral:  Negative for depression.       Objective:     BP 126/70 (BP  Location: Left Arm, Patient Position: Sitting, Cuff Size: Normal)   Pulse 60   Temp (!) 97.4 F (36.3 C) (Oral)   Ht 4' 10.66" (1.49 m)   Wt 101 lb 4.8 oz (45.9 kg)   SpO2 100%   BMI 20.70 kg/m    Physical Exam Vitals reviewed.  Constitutional:      Appearance: She is well-developed.  HENT:     Head: Normocephalic and atraumatic.  Eyes:     Pupils: Pupils are equal, round, and reactive to light.  Neck:     Thyroid: No thyromegaly.  Cardiovascular:     Rate and Rhythm: Normal rate and regular rhythm.     Heart sounds: Normal heart sounds.  Pulmonary:     Effort: No respiratory distress.     Breath sounds: Normal breath sounds. No wheezing or rales.  Abdominal:     General: Bowel sounds are normal. There is no distension.     Palpations: Abdomen is soft. There is no mass.     Tenderness: There is no abdominal tenderness. There is no guarding or rebound.  Musculoskeletal:        General: Normal range of motion.     Cervical back: Normal range of motion and neck supple.     Right lower leg: No edema.     Left lower leg: No edema.  Lymphadenopathy:     Cervical: No cervical adenopathy.  Skin:    Findings: No rash.  Neurological:     Mental Status: She is alert and oriented to person, place, and time.     Cranial Nerves: No cranial nerve deficit.  Psychiatric:        Behavior: Behavior normal.        Thought Content: Thought content normal.        Judgment: Judgment normal.      Results for orders placed or performed in visit on 01/31/22  CBC with Differential/Platelet  Result Value Ref Range   WBC 4.6 4.0 - 10.5 K/uL   RBC 4.80 3.87 - 5.11 Mil/uL   Hemoglobin 13.6 12.0 - 15.0 g/dL   HCT 41.3 36.0 - 46.0 %   MCV 86.0 78.0 - 100.0 fl   MCHC 32.9 30.0 - 36.0 g/dL   RDW 15.5 11.5 - 15.5 %   Platelets 223.0 150.0 - 400.0 K/uL   Neutrophils Relative % 63.8 43.0 - 77.0 %   Lymphocytes Relative 24.6 12.0 - 46.0 %   Monocytes Relative 9.1 3.0 - 12.0 %   Eosinophils  Relative 1.6 0.0 - 5.0 %   Basophils Relative 0.9 0.0 - 3.0 %   Neutro Abs 3.0 1.4 - 7.7 K/uL   Lymphs Abs 1.1 0.7 - 4.0 K/uL   Monocytes Absolute 0.4 0.1 - 1.0 K/uL   Eosinophils Absolute 0.1 0.0 - 0.7 K/uL   Basophils Absolute 0.0 0.0 - 0.1 K/uL  Hepatic function panel  Result Value Ref Range   Total Bilirubin 0.7 0.2 -  1.2 mg/dL   Bilirubin, Direct 0.1 0.0 - 0.3 mg/dL   Alkaline Phosphatase 78 39 - 117 U/L   AST 17 0 - 37 U/L   ALT 13 0 - 35 U/L   Total Protein 6.4 6.0 - 8.3 g/dL   Albumin 4.3 3.5 - 5.2 g/dL  TSH  Result Value Ref Range   TSH 5.07 0.35 - 5.50 uIU/mL  Lipid panel  Result Value Ref Range   Cholesterol 195 0 - 200 mg/dL   Triglycerides 144.3 0.0 - 149.0 mg/dL   HDL 15.40 >08.67 mg/dL   VLDL 61.9 0.0 - 50.9 mg/dL   LDL Cholesterol 326 (H) 0 - 99 mg/dL   Total CHOL/HDL Ratio 3    NonHDL 133.33   Basic metabolic panel  Result Value Ref Range   Sodium 140 135 - 145 mEq/L   Potassium 4.2 3.5 - 5.1 mEq/L   Chloride 108 96 - 112 mEq/L   CO2 27 19 - 32 mEq/L   Glucose, Bld 90 70 - 99 mg/dL   BUN 20 6 - 23 mg/dL   Creatinine, Ser 7.12 0.40 - 1.20 mg/dL   GFR 45.80 >99.83 mL/min   Calcium 9.7 8.4 - 10.5 mg/dL      The 38-SNKN ASCVD risk score (Arnett DK, et al., 2019) is: 21.5%    Assessment & Plan:   Physical exam.  Generally healthy 76 year old female.  Immunizations up-to-date with exception of Shingrix.  She declines at this time.  Continue regular weightbearing exercise Continue annual flu vaccine  History of osteoporosis.  We discussed potential therapies but at this point she declines.  We discussed other health maintenance items such as mammography but she declines  She has history of some cold intolerance and will assess labs including TSH.  She has hyperlipidemia and we will recheck lipids and chemistries.  Somewhat surprisingly she had elevated triglycerides and lower HDL last year which were atypical for her   No follow-ups on file.     Evelena Peat, MD

## 2022-01-31 NOTE — Telephone Encounter (Signed)
Noted  

## 2022-02-03 DIAGNOSIS — K317 Polyp of stomach and duodenum: Secondary | ICD-10-CM | POA: Diagnosis not present

## 2022-02-03 DIAGNOSIS — K5120 Ulcerative (chronic) proctitis without complications: Secondary | ICD-10-CM | POA: Diagnosis not present

## 2022-02-03 DIAGNOSIS — K5130 Ulcerative (chronic) rectosigmoiditis without complications: Secondary | ICD-10-CM | POA: Diagnosis not present

## 2022-02-03 DIAGNOSIS — Z1211 Encounter for screening for malignant neoplasm of colon: Secondary | ICD-10-CM | POA: Diagnosis not present

## 2022-02-03 DIAGNOSIS — R09A2 Foreign body sensation, throat: Secondary | ICD-10-CM | POA: Diagnosis not present

## 2022-02-03 DIAGNOSIS — D125 Benign neoplasm of sigmoid colon: Secondary | ICD-10-CM | POA: Diagnosis not present

## 2022-02-03 DIAGNOSIS — K635 Polyp of colon: Secondary | ICD-10-CM | POA: Diagnosis not present

## 2022-02-06 ENCOUNTER — Encounter: Payer: Self-pay | Admitting: Podiatry

## 2022-02-06 ENCOUNTER — Ambulatory Visit: Payer: Medicare PPO | Admitting: Podiatry

## 2022-02-06 DIAGNOSIS — L84 Corns and callosities: Secondary | ICD-10-CM

## 2022-02-06 DIAGNOSIS — M2041 Other hammer toe(s) (acquired), right foot: Secondary | ICD-10-CM

## 2022-02-06 NOTE — Progress Notes (Signed)
Subjective:   Patient ID: Caitlin Hicks, female   DOB: 76 y.o.   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

## 2022-03-18 DIAGNOSIS — M25572 Pain in left ankle and joints of left foot: Secondary | ICD-10-CM | POA: Diagnosis not present

## 2022-03-27 ENCOUNTER — Ambulatory Visit: Payer: Medicare PPO | Admitting: Sports Medicine

## 2022-03-31 DIAGNOSIS — M25572 Pain in left ankle and joints of left foot: Secondary | ICD-10-CM | POA: Diagnosis not present

## 2022-03-31 DIAGNOSIS — M76822 Posterior tibial tendinitis, left leg: Secondary | ICD-10-CM | POA: Diagnosis not present

## 2022-04-03 DIAGNOSIS — M25572 Pain in left ankle and joints of left foot: Secondary | ICD-10-CM | POA: Diagnosis not present

## 2022-04-09 DIAGNOSIS — M25572 Pain in left ankle and joints of left foot: Secondary | ICD-10-CM | POA: Diagnosis not present

## 2022-04-14 DIAGNOSIS — M25572 Pain in left ankle and joints of left foot: Secondary | ICD-10-CM | POA: Diagnosis not present

## 2022-04-17 DIAGNOSIS — M25572 Pain in left ankle and joints of left foot: Secondary | ICD-10-CM | POA: Diagnosis not present

## 2022-04-21 ENCOUNTER — Telehealth: Payer: Self-pay | Admitting: *Deleted

## 2022-04-21 ENCOUNTER — Encounter: Payer: Self-pay | Admitting: *Deleted

## 2022-04-21 DIAGNOSIS — M25572 Pain in left ankle and joints of left foot: Secondary | ICD-10-CM | POA: Diagnosis not present

## 2022-04-21 NOTE — Patient Outreach (Signed)
  Care Coordination   Initial Visit Note   04/21/2022 Name: Caitlin Hicks MRN: [id number] DOB: 1946/02/08  Caitlin Hicks is a 77 y.o. year old female who sees Burchette, Alinda Sierras, MD for primary care. I spoke with  Caitlin Hicks by phone today.  What matters to the patients health and wellness today?  No needs    Goals Addressed             This Visit's Progress    COMPLETED: care coordination activity       Care Coordination Interventions: Reviewed medications with patient and discussed adherence with no needed refills Reviewed scheduled/upcoming provider appointments including sufficient transportation source. Screening for signs and symptoms of depression related to chronic disease state  Assessed social determinant of health barriers Educated on care management services with no needs presented at this time.          SDOH assessments and interventions completed:  Yes  SDOH Interventions Today    Flowsheet Row Most Recent Value  SDOH Interventions   Food Insecurity Interventions Intervention Not Indicated  Housing Interventions Intervention Not Indicated  Transportation Interventions Intervention Not Indicated  Utilities Interventions Intervention Not Indicated        Care Coordination Interventions:  Yes, provided   Follow up plan: No further intervention required.   Encounter Outcome:  Pt. Visit Completed   Raina Mina, RN Care Management Coordinator [place] Office [phone]

## 2022-04-21 NOTE — Patient Instructions (Signed)
Visit Information  Thank you for taking time to visit with me today. Please don't hesitate to contact me if I can be of assistance to you.   Following are the goals we discussed today:   Goals Addressed             This Visit's Progress    COMPLETED: care coordination activity       Care Coordination Interventions: Reviewed medications with patient and discussed adherence with no needed refills Reviewed scheduled/upcoming provider appointments including sufficient transportation source. Screening for signs and symptoms of depression related to chronic disease state  Assessed social determinant of health barriers Educated on care management services with no needs presented at this time.           Please call the care guide team at [phone] if you need to cancel or reschedule your appointment.   If you are experiencing a Mental Health or [place] or need someone to talk to, please call the Suicide and Crisis Lifeline: 988  Patient verbalizes understanding of instructions and care plan provided today and agrees to view in [place]. Active MyChart status and patient understanding of how to access instructions and care plan via MyChart confirmed with patient.     No further follow up required: No needs

## 2022-04-24 DIAGNOSIS — M25572 Pain in left ankle and joints of left foot: Secondary | ICD-10-CM | POA: Diagnosis not present

## 2022-05-02 DIAGNOSIS — M76822 Posterior tibial tendinitis, left leg: Secondary | ICD-10-CM | POA: Diagnosis not present

## 2022-05-26 DIAGNOSIS — L814 Other melanin hyperpigmentation: Secondary | ICD-10-CM | POA: Diagnosis not present

## 2022-05-26 DIAGNOSIS — R208 Other disturbances of skin sensation: Secondary | ICD-10-CM | POA: Diagnosis not present

## 2022-05-26 DIAGNOSIS — L538 Other specified erythematous conditions: Secondary | ICD-10-CM | POA: Diagnosis not present

## 2022-05-26 DIAGNOSIS — L853 Xerosis cutis: Secondary | ICD-10-CM | POA: Diagnosis not present

## 2022-05-26 DIAGNOSIS — L84 Corns and callosities: Secondary | ICD-10-CM | POA: Diagnosis not present

## 2022-05-26 DIAGNOSIS — Z789 Other specified health status: Secondary | ICD-10-CM | POA: Diagnosis not present

## 2022-05-26 DIAGNOSIS — D1801 Hemangioma of skin and subcutaneous tissue: Secondary | ICD-10-CM | POA: Diagnosis not present

## 2022-05-26 DIAGNOSIS — L821 Other seborrheic keratosis: Secondary | ICD-10-CM | POA: Diagnosis not present

## 2022-06-17 ENCOUNTER — Encounter (INDEPENDENT_AMBULATORY_CARE_PROVIDER_SITE_OTHER): Payer: Medicare PPO | Admitting: Ophthalmology

## 2022-06-25 DIAGNOSIS — H353131 Nonexudative age-related macular degeneration, bilateral, early dry stage: Secondary | ICD-10-CM | POA: Diagnosis not present

## 2022-06-30 DIAGNOSIS — M76822 Posterior tibial tendinitis, left leg: Secondary | ICD-10-CM | POA: Diagnosis not present

## 2022-06-30 DIAGNOSIS — M25572 Pain in left ankle and joints of left foot: Secondary | ICD-10-CM | POA: Diagnosis not present

## 2022-07-09 DIAGNOSIS — M25572 Pain in left ankle and joints of left foot: Secondary | ICD-10-CM | POA: Diagnosis not present

## 2022-07-18 DIAGNOSIS — M25872 Other specified joint disorders, left ankle and foot: Secondary | ICD-10-CM | POA: Diagnosis not present

## 2022-07-18 DIAGNOSIS — M76822 Posterior tibial tendinitis, left leg: Secondary | ICD-10-CM | POA: Diagnosis not present

## 2022-07-31 DIAGNOSIS — M25572 Pain in left ankle and joints of left foot: Secondary | ICD-10-CM | POA: Diagnosis not present

## 2022-08-04 DIAGNOSIS — M76822 Posterior tibial tendinitis, left leg: Secondary | ICD-10-CM | POA: Diagnosis not present

## 2022-08-05 DIAGNOSIS — M6702 Short Achilles tendon (acquired), left ankle: Secondary | ICD-10-CM | POA: Diagnosis not present

## 2022-08-05 DIAGNOSIS — Q6689 Other  specified congenital deformities of feet: Secondary | ICD-10-CM | POA: Diagnosis not present

## 2022-08-05 DIAGNOSIS — G8918 Other acute postprocedural pain: Secondary | ICD-10-CM | POA: Diagnosis not present

## 2022-08-05 DIAGNOSIS — M19072 Primary osteoarthritis, left ankle and foot: Secondary | ICD-10-CM | POA: Diagnosis not present

## 2022-08-05 DIAGNOSIS — M76822 Posterior tibial tendinitis, left leg: Secondary | ICD-10-CM | POA: Diagnosis not present

## 2022-08-20 DIAGNOSIS — Z4889 Encounter for other specified surgical aftercare: Secondary | ICD-10-CM | POA: Diagnosis not present

## 2022-08-20 DIAGNOSIS — M76822 Posterior tibial tendinitis, left leg: Secondary | ICD-10-CM | POA: Diagnosis not present

## 2022-08-26 DIAGNOSIS — K5130 Ulcerative (chronic) rectosigmoiditis without complications: Secondary | ICD-10-CM | POA: Diagnosis not present

## 2022-09-04 DIAGNOSIS — M76822 Posterior tibial tendinitis, left leg: Secondary | ICD-10-CM | POA: Diagnosis not present

## 2022-09-19 DIAGNOSIS — Z4889 Encounter for other specified surgical aftercare: Secondary | ICD-10-CM | POA: Diagnosis not present

## 2022-09-19 DIAGNOSIS — M76822 Posterior tibial tendinitis, left leg: Secondary | ICD-10-CM | POA: Diagnosis not present

## 2022-10-04 DIAGNOSIS — M76822 Posterior tibial tendinitis, left leg: Secondary | ICD-10-CM | POA: Diagnosis not present

## 2022-10-09 DIAGNOSIS — M76822 Posterior tibial tendinitis, left leg: Secondary | ICD-10-CM | POA: Diagnosis not present

## 2022-10-09 DIAGNOSIS — Z4889 Encounter for other specified surgical aftercare: Secondary | ICD-10-CM | POA: Diagnosis not present

## 2022-10-13 DIAGNOSIS — M25572 Pain in left ankle and joints of left foot: Secondary | ICD-10-CM | POA: Diagnosis not present

## 2022-10-15 DIAGNOSIS — M25572 Pain in left ankle and joints of left foot: Secondary | ICD-10-CM | POA: Diagnosis not present

## 2022-10-28 DIAGNOSIS — M25572 Pain in left ankle and joints of left foot: Secondary | ICD-10-CM | POA: Diagnosis not present

## 2022-11-06 ENCOUNTER — Encounter (INDEPENDENT_AMBULATORY_CARE_PROVIDER_SITE_OTHER): Payer: Self-pay

## 2022-11-14 DIAGNOSIS — M76822 Posterior tibial tendinitis, left leg: Secondary | ICD-10-CM | POA: Diagnosis not present

## 2022-12-15 ENCOUNTER — Ambulatory Visit (INDEPENDENT_AMBULATORY_CARE_PROVIDER_SITE_OTHER): Payer: Medicare PPO

## 2022-12-15 VITALS — Ht 59.0 in | Wt 101.0 lb

## 2022-12-15 DIAGNOSIS — Z0000 Encounter for general adult medical examination without abnormal findings: Secondary | ICD-10-CM

## 2022-12-15 NOTE — Progress Notes (Signed)
Subjective:   Caitlin Hicks is a 77 y.o. female who presents for Medicare Annual (Subsequent) preventive examination.  Visit Complete: Virtual  I connected with  Caitlin Hicks on 12/15/22 by a audio enabled telemedicine application and verified that I am speaking with the correct person using two identifiers.  Patient Location: Home  Provider Location: Home Office  I discussed the limitations of evaluation and management by telemedicine. The patient expressed understanding and agreed to proceed.   Vital Signs: Unable to obtain new vitals due to this being a telehealth visit.   Cardiac Risk Factors include: advanced age (>59men, >25 women)     Objective:    Today's Vitals   12/15/22 1021  Weight: 101 lb (45.8 kg)  Height: 4\' 11"  (1.499 m)   Body mass index is 20.4 kg/m.     12/15/2022   10:29 AM 12/11/2021   10:52 AM 02/04/2021    6:08 AM 11/28/2020   11:09 AM 07/23/2020    4:23 PM 07/23/2020    9:51 AM 07/16/2020   11:42 AM  Advanced Directives  Does Patient Have a Medical Advance Directive? Yes Yes Yes Yes Yes Yes Yes  Type of Estate agent of [place];Living will Healthcare Power of [place];Living will Healthcare Power of [place];Living will Healthcare Power of Attorney Living will;Healthcare Power of [employer] Power of [place];Living will Healthcare Power of [place];Living will  Does patient want to make changes to medical advance directive?     No - Patient declined No - Patient declined   Copy of Healthcare Power of Attorney in Chart? No - copy requested No - copy requested  No - copy requested No - copy requested No - copy requested     Current Medications (verified) Outpatient Encounter Medications as of 12/15/2022  Medication Sig   calcium carbonate (TUMS EX) 750 MG chewable tablet Chew 1 tablet by mouth daily as needed for heartburn.   Calcium-Magnesium-Zinc (CAL-MAG-ZINC PO) Take 1 tablet by mouth 2 (two) times a week.    Cholecalciferol (D3-1000) 25 MCG (1000 UT) capsule Take 1,000 Units by mouth daily.   famotidine-calcium carbonate-magnesium hydroxide (PEPCID COMPLETE) 10-800-165 MG chewable tablet Chew 1 tablet by mouth daily as needed (acid reflux).   mesalamine (LIALDA) 1.2 G EC tablet Take 2.4 g by mouth 2 (two) times daily. Two tablets in the morning and Two tablets at bedtime   Multiple Vitamins-Minerals (PRESERVISION AREDS 2 PO) Take 1 capsule by mouth 2 (two) times a week.   No facility-administered encounter medications on file as of 12/15/2022.    Allergies (verified) Sulfonamide derivatives, Sulfa antibiotics, Nsaids, and Penicillins   History: 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HCC)    Ulcerative colitis    age 17   WPW (Wolff-Parkinson-White syndrome)    Past Surgical History:  Procedure Laterality Date   BREAST BIOPSY Right 02/22/1995   CATARACT EXTRACTION W/ INTRAOCULAR LENS  IMPLANT, BILATERAL Bilateral 2-05/2011   INGUINAL HERNIA REPAIR Right ~ 1981   ORIF HUMERUS FRACTURE Left 01/11/2013   Procedure: LEFT ORIF PROXIMAL HUMERUS FRACTURE;  Surgeon: Senaida Lange, MD;  Location: [facility] OR;  Service: Orthopedics;  Laterality: Left;  SAME DAY LABS   ORIF PROXIMAL HUMERUS FRACTURE Left 01/11/2013   SVT ABLATION N/A 02/04/2021   Procedure:  SVT ABLATION;  Surgeon: Lanier Prude, MD;  Location: [facility] INVASIVE CV LAB;  Service: Cardiovascular;  Laterality: N/A;   TONSILLECTOMY  1948   TOTAL KNEE ARTHROPLASTY Left 07/23/2020   Procedure: TOTAL KNEE ARTHROPLASTY;  Surgeon: Ollen Gross, MD;  Location: [facility] ORS;  Service: Orthopedics;  Laterality: Left;    TUBAL LIGATION  ~ 1985   Family History  Problem Relation Age of Onset   Osteoporosis Mother    Prostate cancer Other    Social History   Socioeconomic History    Marital status: Married    Spouse name: Not on file   Number of children: Not on file   Years of education: Not on file   Highest education level: Not on file  Occupational History   Not on file  Tobacco Use   Smoking status: Never   Smokeless tobacco: Never  Vaping Use   Vaping status: Never Used  Substance and Sexual Activity   Alcohol use: Yes    Alcohol/week: 2.0 standard drinks of alcohol    Types: 2 Glasses of wine per week    Comment: 01/11/2013 "2-3 glasses of wine/wk; some weeks not even that"   Drug use: No   Sexual activity: Yes  Other Topics Concern   Not on file  Social History Narrative   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         Social Determinants of Health   Financial Resource Strain: Low Risk  (12/15/2022)   Overall Financial Resource Strain (CARDIA)    Difficulty of Paying Living Expenses: Not hard at all  Food Insecurity: No Food Insecurity (12/15/2022)   Hunger Vital Sign    Worried About Running Out of Food in the Last Year: Never true    Ran Out of Food in the Last Year: Never true  Transportation Needs: No Transportation Needs (12/15/2022)   PRAPARE - Administrator, Civil Service (Medical): No    Lack of Transportation (Non-Medical): No  Physical Activity: Sufficiently Active (12/15/2022)   Exercise Vital Sign    Days of Exercise per Week: 5 days    Minutes of Exercise per Session: 30 min  Stress: No Stress Concern Present (12/15/2022)   [employer] of Occupational Health - Occupational Stress Questionnaire    Feeling of Stress : Not at all  Social Connections: Socially Integrated (12/15/2022)   Social Connection and Isolation Panel [NHANES]    Frequency of Communication with Friends and Family: More than three times a week    Frequency of Social Gatherings with Friends and Family: More than three times a week    Attends Religious Services: More than 4 times per year    Active Member of [employer] or Organizations: Yes    Attends Probation officer: More than 4 times per year    Marital Status: Married    Tobacco Counseling Counseling given: Not Answered   Clinical Intake:  Pre-visit preparation completed: Yes  Pain : No/denies pain     BMI - recorded: 20.4 Nutritional Status: BMI of 19-24  Normal Nutritional Risks: None Diabetes: No  How often do you need to have someone help you when you read instructions, pamphlets, or other written materials from your doctor or pharmacy?: 1 - Never  Interpreter Needed?: No  Information entered by :: Theresa Mulligan LPN   Activities of Daily Living    12/15/2022   10:27 AM  In your present state of health, do you have any difficulty  performing the following activities:  Hearing? 0  Vision? 0  Difficulty concentrating or making decisions? 0  Walking or climbing stairs? 0  Dressing or bathing? 0  Doing errands, shopping? 0  Preparing Food and eating ? N  Using the Toilet? N  In the past six months, have you accidently leaked urine? N  Do you have problems with loss of bowel control? N  Managing your Medications? N  Managing your Finances? N  Housekeeping or managing your Housekeeping? N    Patient Care Team: [name], MD as PCP - General Lanier Prude, MD as PCP - Electrophysiology (Cardiology) Antony Contras, MD as Consulting Physician (Ophthalmology)  Indicate any recent Medical Services you may have received from other than [facility] providers in the past year (date may be approximate).     Assessment:   This is a routine wellness examination for Kieanna.  Hearing/Vision screen Hearing Screening - Comments:: Denies hearing difficulties   Vision Screening - Comments:: Wears rx glasses - up to date with routine eye exams with  Dr Randon Goldsmith   Goals Addressed               This Visit's Progress     Patient Stated (pt-stated)        To keep physically and mentally fit.       Depression Screen    12/15/2022   10:26 AM  12/25/2021    3:44 PM 12/11/2021   10:48 AM 11/28/2020   11:07 AM 11/23/2019    2:08 PM 04/27/2019   10:14 AM 11/06/2017    7:19 AM  PHQ 2/9 Scores  PHQ - 2 Score 0 0 0 0 0 0 0  PHQ- 9 Score  0   0 0     Fall Risk    12/15/2022   10:27 AM 01/31/2022    8:04 AM 12/25/2021    3:45 PM 12/11/2021   10:50 AM 11/28/2020   11:11 AM  Fall Risk   Falls in the past year? 0 0 0 0 0  Number falls in past yr: 0 0 0 0 0  Injury with Fall? 0 0 0 0 0  Risk for fall due to : No Fall Risks No Fall Risks No Fall Risks No Fall Risks Impaired vision  Follow up Falls prevention discussed Falls evaluation completed Falls evaluation completed Falls prevention discussed Falls prevention discussed    MEDICARE RISK AT HOME: Medicare Risk at Home Any stairs in or around the home?: Yes If so, are there any without handrails?: No Home free of loose throw rugs in walkways, pet beds, electrical cords, etc?: Yes Adequate lighting in your home to reduce risk of falls?: Yes Life alert?: Yes (Apple Watch) Use of a cane, walker or w/c?: No Grab bars in the bathroom?: Yes Shower chair or bench in shower?: No Elevated toilet seat or a handicapped toilet?: No  TIMED UP AND GO:  Was the test performed?  No    Cognitive Function:         12/15/2022   10:29 AM 12/11/2021   10:52 AM 11/28/2020   11:15 AM  6CIT Screen  What Year? 0 points 0 points 0 points  What month? 0 points 0 points 0 points  What time? 0 points 0 points 0 points  Count back from 20 0 points 0 points 0 points  Months in reverse 0 points 0 points 0 points  Repeat phrase 0 points 0 points 0 points  Total Score  0 points 0 points 0 points    Immunizations Immunization History  Administered Date(s) Administered   Influenza Split 01/23/2011   Influenza Whole 12/22/2009, 01/03/2014   Influenza, High Dose Seasonal PF 02/07/2016, 01/22/2017, 01/08/2018, 01/13/2022   Influenza-Unspecified 01/05/2013, 12/23/2014, 01/07/2019, 01/04/2021    PFIZER(Purple Top)SARS-COV-2 Vaccination 04/13/2019, 05/04/2019, 12/23/2019, 08/16/2020   Pneumococcal Conjugate-13 12/09/2013   Pneumococcal Polysaccharide-23 06/28/2008   Tdap 12/09/2013   Zoster, Live 03/25/2007    TDAP status: Up to date  Flu Vaccine status: Due, Education has been provided regarding the importance of this vaccine. Advised may receive this vaccine at local pharmacy or Health Dept. Aware to provide a copy of the vaccination record if obtained from local pharmacy or Health Dept. Verbalized acceptance and understanding.  Pneumococcal vaccine status: Up to date  Covid-19 vaccine status: Declined, Education has been provided regarding the importance of this vaccine but patient still declined. Advised may receive this vaccine at local pharmacy or Health Dept.or vaccine clinic. Aware to provide a copy of the vaccination record if obtained from local pharmacy or Health Dept. Verbalized acceptance and understanding.  Qualifies for Shingles Vaccine? Yes   Zostavax completed No   Shingrix Completed?: No.    Education has been provided regarding the importance of this vaccine. Patient has been advised to call insurance company to determine out of pocket expense if they have not yet received this vaccine. Advised may also receive vaccine at local pharmacy or Health Dept. Verbalized acceptance and understanding.  Screening Tests Health Maintenance  Topic Date Due   Hepatitis C Screening  Never done   Zoster Vaccines- Shingrix (1 of 2) 05/24/1993   INFLUENZA VACCINE  10/23/2022   COVID-19 Vaccine (5 - 2023-24 season) 11/23/2022   DTaP/Tdap/Td (2 - Td or Tdap) 12/10/2023   Medicare Annual Wellness (AWV)  12/15/2023   Pneumonia Vaccine 52+ Years old  Completed   DEXA SCAN  Completed   HPV VACCINES  Aged Out   Colonoscopy  Discontinued    Health Maintenance  Health Maintenance Due  Topic Date Due   Hepatitis C Screening  Never done   Zoster Vaccines- Shingrix (1 of 2)  05/24/1993   INFLUENZA VACCINE  10/23/2022   COVID-19 Vaccine (5 - 2023-24 season) 11/23/2022        Bone Density status: Completed 04/19/19. Results reflect: Bone density results: OSTEOPOROSIS. Repeat every   years.    Additional Screening:  Hepatitis C Screening: does qualify; Deferred  Vision Screening: Recommended annual ophthalmology exams for early detection of glaucoma and other disorders of the eye. Is the patient up to date with their annual eye exam?  Yes  Who is the provider or what is the name of the office in which the patient attends annual eye exams? Dr Randon Goldsmith If pt is not established with a provider, would they like to be referred to a provider to establish care? No .   Dental Screening: Recommended annual dental exams for proper oral hygiene    Community Resource Referral / Chronic Care Management:  CRR required this visit?  No   CCM required this visit?  No     Plan:     I have personally reviewed and noted the following in the patient's chart:   Medical and social history Use of alcohol, tobacco or illicit drugs  Current medications and supplements including opioid prescriptions. Patient is not currently taking opioid prescriptions. Functional ability and status Nutritional status Physical activity Advanced directives List of other physicians Hospitalizations, surgeries, and  ER visits in previous 12 months Vitals Screenings to include cognitive, depression, and falls Referrals and appointments  In addition, I have reviewed and discussed with patient certain preventive protocols, quality metrics, and best practice recommendations. A written personalized care plan for preventive services as well as general preventive health recommendations were provided to patient.     [name], LPN   06/24/4740   After Visit Summary: (MyChart) Due to this being a telephonic visit, the after visit summary with patients personalized plan was offered to  patient via MyChart   Nurse Notes: None

## 2022-12-15 NOTE — Patient Instructions (Addendum)
Ms. Amend , Thank you for taking time to come for your Medicare Wellness Visit. I appreciate your ongoing commitment to your health goals. Please review the following plan we discussed and let me know if I can assist you in the future.   Referrals/Orders/Follow-Ups/Clinician Recommendations:   This is a list of the screening recommended for you and due dates:  Health Maintenance  Topic Date Due   Hepatitis C Screening  Never done   Zoster (Shingles) Vaccine (1 of 2) 05/24/1993   Flu Shot  10/23/2022   COVID-19 Vaccine (5 - 2023-24 season) 11/23/2022   DTaP/Tdap/Td vaccine (2 - Td or Tdap) 12/10/2023   Medicare Annual Wellness Visit  12/15/2023   Pneumonia Vaccine  Completed   DEXA scan (bone density measurement)  Completed   HPV Vaccine  Aged Out   Colon Cancer Screening  Discontinued    Advanced directives: (Copy Requested) Please bring a copy of your health care power of attorney and living will to the office to be added to your chart at your convenience.  Next Medicare Annual Wellness Visit scheduled for next year: Yes

## 2023-01-02 ENCOUNTER — Ambulatory Visit (INDEPENDENT_AMBULATORY_CARE_PROVIDER_SITE_OTHER): Payer: Medicare PPO

## 2023-01-02 DIAGNOSIS — Z23 Encounter for immunization: Secondary | ICD-10-CM

## 2023-01-22 ENCOUNTER — Ambulatory Visit: Payer: Medicare PPO | Admitting: Adult Health

## 2023-01-22 ENCOUNTER — Encounter: Payer: Self-pay | Admitting: Adult Health

## 2023-01-22 VITALS — BP 128/70 | HR 68 | Temp 98.0°F | Ht 59.0 in | Wt 104.0 lb

## 2023-01-22 DIAGNOSIS — J988 Other specified respiratory disorders: Secondary | ICD-10-CM | POA: Diagnosis not present

## 2023-01-22 MED ORDER — DOXYCYCLINE HYCLATE 100 MG PO CAPS: 100.0000 mg | ORAL_CAPSULE | Freq: Two times a day (BID) | ORAL | 0 refills | Status: DC

## 2023-01-22 MED ORDER — METHYLPREDNISOLONE 4 MG PO TBPK: ORAL_TABLET | ORAL | 0 refills | Status: DC

## 2023-01-22 NOTE — Progress Notes (Signed)
Subjective:    Patient ID: Caitlin Hicks, female    DOB: 1945/12/08, 77 y.o.   MRN: [id number]  HPI 77 year old female who  has a past medical history of Arthritis, Colitis, Dysrhythmia, GERD (gastroesophageal reflux disease), Osteoporosis, Segmental colitis (HCC), Ulcerative colitis, and WPW (Wolff-Parkinson-White syndrome).  She is a patient of Dr. Caryl Never who I am seeing today for an acute issue. She reports that for the last 11 days she has been experiencing a productive  cough, nasal and chest congestion, headaches, ear fullness, wheezing and fatigue. Symptoms have not improved over the last 11 days   She has not had any fevers chills, shortness of breath, nausea, vomiting, or diarrhea   At home she has been using tylenol and nasal sprays which help to some degree.    Review of Systems See HPI   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HCC)    Ulcerative colitis    age 33   WPW (Wolff-Parkinson-White syndrome)     Social History   Socioeconomic History   Marital status: Married    Spouse name: Not on file   Number of children: Not on file   Years of education: Not on file   Highest education level: Not on file  Occupational History   Not on file  Tobacco Use   Smoking status: Never   Smokeless tobacco: Never  Vaping Use   Vaping status: Never Used  Substance and Sexual Activity   Alcohol use: Yes    Alcohol/week: 2.0 standard drinks of alcohol    Types: 2 Glasses of wine per week    Comment: 01/11/2013 "2-3 glasses of wine/wk; some weeks not even that"   Drug use: No   Sexual activity: Yes  Other Topics Concern   Not on file  Social History Narrative   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         Social Determinants of Health   Financial Resource Strain: Low Risk  (12/15/2022)   Overall Financial  Resource Strain (CARDIA)    Difficulty of Paying Living Expenses: Not hard at all  Food Insecurity: No Food Insecurity (12/15/2022)   Hunger Vital Sign    Worried About Running Out of Food in the Last Year: Never true    Ran Out of Food in the Last Year: Never true  Transportation Needs: No Transportation Needs (12/15/2022)   PRAPARE - Administrator, Civil Service (Medical): No    Lack of Transportation (Non-Medical): No  Physical Activity: Sufficiently Active (12/15/2022)   Exercise Vital Sign    Days of Exercise per Week: 5 days    Minutes of Exercise per Session: 30 min  Stress: No Stress Concern Present (12/15/2022)   [employer] of Occupational Health - Occupational Stress Questionnaire    Feeling of Stress : Not at all  Social Connections: Socially Integrated (12/15/2022)   Social Connection and Isolation Panel [NHANES]    Frequency of Communication with Friends and Family: More than three times a week    Frequency of Social Gatherings with Friends and Family: More than three times a week    Attends Religious Services: More than 4 times per year    Active Member of [employer] or Organizations: Yes    Attends [employer]  or Organization Meetings: More than 4 times per year    Marital Status: Married  Catering manager Violence: Not At Risk (12/15/2022)   Humiliation, Afraid, Rape, and Kick questionnaire    Fear of Current or Ex-Partner: No    Emotionally Abused: No    Physically Abused: No    Sexually Abused: No    Past Surgical History:  Procedure Laterality Date   BREAST BIOPSY Right 02/22/1995   CATARACT EXTRACTION W/ INTRAOCULAR LENS  IMPLANT, BILATERAL Bilateral 2-05/2011   INGUINAL HERNIA REPAIR Right ~ 1981   ORIF HUMERUS FRACTURE Left 01/11/2013   Procedure: LEFT ORIF PROXIMAL HUMERUS FRACTURE;  Surgeon: Senaida Lange, MD;  Location: [facility] OR;  Service: Orthopedics;  Laterality: Left;  SAME DAY LABS   ORIF PROXIMAL HUMERUS FRACTURE Left 01/11/2013   SVT ABLATION N/A  02/04/2021   Procedure: SVT ABLATION;  Surgeon: Lanier Prude, MD;  Location: [facility] INVASIVE CV LAB;  Service: Cardiovascular;  Laterality: N/A;   TONSILLECTOMY  1948   TOTAL KNEE ARTHROPLASTY Left 07/23/2020   Procedure: TOTAL KNEE ARTHROPLASTY;  Surgeon: Ollen Gross, MD;  Location: [facility] ORS;  Service: Orthopedics;  Laterality: Left;    TUBAL LIGATION  ~ 1985    Family History  Problem Relation Age of Onset   Osteoporosis Mother    Prostate cancer Other     Allergies  Allergen Reactions   Sulfonamide Derivatives Rash   Sulfa Antibiotics Rash   Nsaids Other (See Comments)    Contraindicated with lialda   Penicillins Rash    Tolerated Cephalosporin Date: 07/24/20.      Current Outpatient Medications on File Prior to Visit  Medication Sig Dispense Refill   calcium carbonate (TUMS EX) 750 MG chewable tablet Chew 1 tablet by mouth daily as needed for heartburn.     Calcium-Magnesium-Zinc (CAL-MAG-ZINC PO) Take 1 tablet by mouth 2 (two) times a week.     Cholecalciferol (D3-1000) 25 MCG (1000 UT) capsule Take 1,000 Units by mouth daily.     famotidine-calcium carbonate-magnesium hydroxide (PEPCID COMPLETE) 10-800-165 MG chewable tablet Chew 1 tablet by mouth daily as needed (acid reflux).     mesalamine (LIALDA) 1.2 G EC tablet Take 2.4 g by mouth 2 (two) times daily. Two tablets in the morning and Two tablets at bedtime     Multiple Vitamins-Minerals (PRESERVISION AREDS 2 PO) Take 1 capsule by mouth 2 (two) times a week.     No current facility-administered medications on file prior to visit.    BP 128/70   Pulse 68   Temp 98 F (36.7 C) (Oral)   Ht 4\' 11"  (1.499 m)   Wt 104 lb (47.2 kg)   SpO2 98%   BMI 21.01 kg/m       Objective:   Physical Exam Vitals reviewed.  Constitutional:      Appearance: Normal appearance.  HENT:     Right Ear: A middle ear effusion is present. Tympanic membrane is not erythematous or bulging.     Left Ear: A middle ear effusion  is present. Tympanic membrane is not erythematous or bulging.     Nose:     Right Turbinates: Enlarged and swollen.     Left Turbinates: Enlarged and swollen.     Right Sinus: Maxillary sinus tenderness present.     Left Sinus: Maxillary sinus tenderness present.  Cardiovascular:     Pulses: Normal pulses.     Heart sounds: Normal heart sounds.  Pulmonary:     Effort:  Pulmonary effort is normal.     Breath sounds: Examination of the right-upper field reveals wheezing. Examination of the left-upper field reveals wheezing. Examination of the right-middle field reveals wheezing. Examination of the left-middle field reveals wheezing. Examination of the right-lower field reveals wheezing. Examination of the left-lower field reveals wheezing. Wheezing present. No rhonchi or rales.  Musculoskeletal:        General: Normal range of motion.  Skin:    General: Skin is warm and dry.  Neurological:     General: No focal deficit present.     Mental Status: She is oriented to person, place, and time.  Psychiatric:        Mood and Affect: Mood normal.        Behavior: Behavior normal.        Thought Content: Thought content normal.        Judgment: Judgment normal.       Assessment & Plan:  1. Respiratory infection - Will treat due to symptoms and duration. No signs of PNA. Will cover for sinusitis with doxycycline and provide medrol dose pack for wheezing.  - doxycycline (VIBRAMYCIN) 100 MG capsule; Take 1 capsule (100 mg total) by mouth 2 (two) times daily.  Dispense: 14 capsule; Refill: 0 - methylPREDNISolone (MEDROL DOSEPAK) 4 MG TBPK tablet; Take as directed  Dispense: 21 tablet; Refill: 0

## 2023-01-23 ENCOUNTER — Ambulatory Visit: Payer: Medicare PPO | Admitting: Family Medicine

## 2023-02-15 ENCOUNTER — Other Ambulatory Visit: Payer: Self-pay | Admitting: Medical Genetics

## 2023-02-15 DIAGNOSIS — Z006 Encounter for examination for normal comparison and control in clinical research program: Secondary | ICD-10-CM

## 2023-02-23 DIAGNOSIS — T63441A Toxic effect of venom of bees, accidental (unintentional), initial encounter: Secondary | ICD-10-CM | POA: Diagnosis not present

## 2023-02-24 DIAGNOSIS — T63441A Toxic effect of venom of bees, accidental (unintentional), initial encounter: Secondary | ICD-10-CM | POA: Diagnosis not present

## 2023-03-17 ENCOUNTER — Other Ambulatory Visit (HOSPITAL_COMMUNITY): Payer: Self-pay

## 2023-03-17 ENCOUNTER — Other Ambulatory Visit (HOSPITAL_COMMUNITY)
Admission: RE | Admit: 2023-03-17 | Discharge: 2023-03-17 | Disposition: A | Discharge status: Home / Self Care | Payer: Medicare PPO | Source: Ambulatory Visit | Attending: Medical Genetics | Admitting: Medical Genetics

## 2023-03-17 DIAGNOSIS — Z006 Encounter for examination for normal comparison and control in clinical research program: Secondary | ICD-10-CM

## 2023-03-30 LAB — GENECONNECT MOLECULAR SCREEN: Genetic Analysis Overall Interpretation: NEGATIVE

## 2023-04-08 DIAGNOSIS — D485 Neoplasm of uncertain behavior of skin: Secondary | ICD-10-CM | POA: Diagnosis not present

## 2023-04-08 DIAGNOSIS — L820 Inflamed seborrheic keratosis: Secondary | ICD-10-CM | POA: Diagnosis not present

## 2023-04-08 DIAGNOSIS — L814 Other melanin hyperpigmentation: Secondary | ICD-10-CM | POA: Diagnosis not present

## 2023-04-08 DIAGNOSIS — D225 Melanocytic nevi of trunk: Secondary | ICD-10-CM | POA: Diagnosis not present

## 2023-04-08 DIAGNOSIS — Z961 Presence of intraocular lens: Secondary | ICD-10-CM | POA: Diagnosis not present

## 2023-04-08 DIAGNOSIS — H524 Presbyopia: Secondary | ICD-10-CM | POA: Diagnosis not present

## 2023-04-08 DIAGNOSIS — L821 Other seborrheic keratosis: Secondary | ICD-10-CM | POA: Diagnosis not present

## 2023-04-13 ENCOUNTER — Ambulatory Visit: Payer: Medicare PPO | Admitting: Podiatry

## 2023-04-13 ENCOUNTER — Encounter: Payer: Self-pay | Admitting: Podiatry

## 2023-04-13 DIAGNOSIS — M2041 Other hammer toe(s) (acquired), right foot: Secondary | ICD-10-CM

## 2023-04-13 DIAGNOSIS — L84 Corns and callosities: Secondary | ICD-10-CM

## 2023-04-14 NOTE — Progress Notes (Signed)
Subjective:   Patient ID: Caitlin Hicks, female   DOB: 78 y.o.   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  Do not recommend other treatments currently reappoint to recheck with all questions answered today

## 2023-04-16 DIAGNOSIS — L509 Urticaria, unspecified: Secondary | ICD-10-CM | POA: Diagnosis not present

## 2023-04-16 DIAGNOSIS — T360X5D Adverse effect of penicillins, subsequent encounter: Secondary | ICD-10-CM | POA: Diagnosis not present

## 2023-04-16 DIAGNOSIS — T63441D Toxic effect of venom of bees, accidental (unintentional), subsequent encounter: Secondary | ICD-10-CM | POA: Diagnosis not present

## 2023-06-09 ENCOUNTER — Encounter: Payer: Self-pay | Admitting: Family Medicine

## 2023-06-23 ENCOUNTER — Encounter: Payer: Self-pay | Admitting: Family Medicine

## 2023-09-17 DIAGNOSIS — K5120 Ulcerative (chronic) proctitis without complications: Secondary | ICD-10-CM | POA: Diagnosis not present

## 2023-09-21 ENCOUNTER — Ambulatory Visit: Admitting: Family Medicine

## 2023-09-21 VITALS — BP 136/74 | Ht 59.5 in | Wt 100.0 lb

## 2023-09-21 DIAGNOSIS — S46812A Strain of other muscles, fascia and tendons at shoulder and upper arm level, left arm, initial encounter: Secondary | ICD-10-CM | POA: Diagnosis not present

## 2023-09-21 MED ORDER — TRAMADOL HCL 50 MG PO TABS: 50.0000 mg | ORAL_TABLET | Freq: Four times a day (QID) | ORAL | 0 refills | Status: DC | PRN

## 2023-09-21 NOTE — Progress Notes (Unsigned)
    SUBJECTIVE:   CHIEF COMPLAINT / HPI:   [name] is a 78 year old F that presents for left neck pain. - Has chronic L neck pain, but it has gotten acutely worse for 2-3 wks - Turning her head will cause her pain to exacerbate  - Tried tylenol  it is not helpful - Had leftover tramadol  from another injury, it is the only thing that has helped her pain but it makes her feel loopy  - Also trying some neck exercises at home, but unsure if it is helping - Pain does not radiate down her arm or back.  Denies numbness or tingling.   PERTINENT  PMH / PSH: UC, osteoporosis, BL knee OA   OBJECTIVE:   BP 136/74   Ht 4' 11.5 (1.511 m)   Wt 100 lb (45.4 kg)   BMI 19.86 kg/m   General: Alert, pleasant woman. NAD. HEENT: NCAT. MMM.   Resp:  Normal WOB on RA.    Skin: Warm, well perfused  MSK: TTP along L posterior cervical neck (in trapezius distribution).  Pain with turning neck to Left and Right. No pain with flexion or ext of neck. Normal strength in L UE.   ASSESSMENT/PLAN:   Assessment & Plan Strain of left trapezius muscle, initial encounter Pain is most cw strain of trapezius muscles. No red flag symptoms such as fever, radiating pain, numbness, or weakness.  Discussed options including formal PT versus trying home exercises.  Patient would like to continue home exercises for now. -Refill provided for tramadol .  Advised to limit use to only when needed for breakthrough pain. -Continue Tylenol  -Continue heat - Counseled on home trapezius exercises -Follow-up in 1 week if not improving.  Consider PT at that time.   Caitlin Nearing, MD [facility]

## 2023-09-21 NOTE — Patient Instructions (Signed)
 You have a trapezius strain/spasms. Do home exercises/stretches daily as directed. Heat 15 minutes at a time as needed. Topical biofreeze or aspercreme may be helpful. Take tramadol  as needed for severe pain - no driving on this. Let us  know over the next 1-2 weeks if you're not improving and I'd recommend doing formal physical therapy.

## 2023-12-18 ENCOUNTER — Ambulatory Visit (INDEPENDENT_AMBULATORY_CARE_PROVIDER_SITE_OTHER)

## 2023-12-18 VITALS — Ht 59.5 in | Wt 100.0 lb

## 2023-12-18 DIAGNOSIS — Z0000 Encounter for general adult medical examination without abnormal findings: Secondary | ICD-10-CM | POA: Diagnosis not present

## 2023-12-18 NOTE — Patient Instructions (Addendum)
 [name],  Thank you for taking the time for your Medicare Wellness Visit. I appreciate your continued commitment to your health goals. Please review the care plan we discussed, and feel free to reach out if I can assist you further.  Medicare recommends these wellness visits once per year to help you and your care team stay ahead of potential health issues. These visits are designed to focus on prevention, allowing your provider to concentrate on managing your acute and chronic conditions during your regular appointments.  Please note that Annual Wellness Visits do not include a physical exam. Some assessments may be limited, especially if the visit was conducted virtually. If needed, we may recommend a separate in-person follow-up with your provider.  Ongoing Care Seeing your primary care provider every 3 to 6 months helps us  monitor your health and provide consistent, personalized care.   Referrals If a referral was made during today's visit and you haven't received any updates within two weeks, please contact the referred provider directly to check on the status.  Recommended Screenings:  Health Maintenance  Topic Date Due   Zoster (Shingles) Vaccine (1 of 2) 05/25/1962   Flu Shot  10/23/2023   COVID-19 Vaccine (5 - 2025-26 season) 11/23/2023   DTaP/Tdap/Td vaccine (2 - Td or Tdap) 12/10/2023   Medicare Annual Wellness Visit  12/17/2024   Pneumococcal Vaccine for age over 49  Completed   DEXA scan (bone density measurement)  Completed   HPV Vaccine  Aged Out   Meningitis B Vaccine  Aged Out   Colon Cancer Screening  Discontinued       12/18/2023   10:50 AM  Advanced Directives  Does Patient Have a Medical Advance Directive? Yes  Type of Estate agent of Caitlin Hicks;Living will  Copy of Healthcare Power of Attorney in Chart? No - copy requested   Advance Care Planning is important because it: Ensures you receive medical care that aligns with your values,  goals, and preferences. Provides guidance to your family and loved ones, reducing the emotional burden of decision-making during critical moments.  Vision: Annual vision screenings are recommended for early detection of glaucoma, cataracts, and diabetic retinopathy. These exams can also reveal signs of chronic conditions such as diabetes and high blood pressure.  Dental: Annual dental screenings help detect early signs of oral cancer, gum disease, and other conditions linked to overall health, including heart disease and diabetes.  Please see the attached documents for additional preventive care recommendations.

## 2023-12-18 NOTE — Progress Notes (Signed)
 Subjective:   Caitlin Hicks is a 78 y.o. who presents for a Medicare Wellness preventive visit.  As a reminder, Annual Wellness Visits don't include a physical exam, and some assessments may be limited, especially if this visit is performed virtually. We may recommend an in-person follow-up visit with your provider if needed.  Visit Complete: Virtual I connected with  [name] on 12/18/23 by a audio enabled telemedicine application and verified that I am speaking with the correct person using two identifiers.  Patient Location: Home  Provider Location: Home Office  I discussed the limitations of evaluation and management by telemedicine. The patient expressed understanding and agreed to proceed.  Vital Signs: Because this visit was a virtual/telehealth visit, some criteria may be missing or patient reported. Any vitals not documented were not able to be obtained and vitals that have been documented are patient reported.    Persons Participating in Visit: Patient.  AWV Questionnaire: No: Patient Medicare AWV questionnaire was not completed prior to this visit.  Cardiac Risk Factors include: advanced age (>5men, >62 women)     Objective:    Today's Vitals   12/18/23 1042  Weight: 100 lb (45.4 kg)  Height: 4' 11.5 (1.511 m)   Body mass index is 19.86 kg/m.     12/18/2023   10:50 AM 12/15/2022   10:29 AM 12/11/2021   10:52 AM 02/04/2021    6:08 AM 11/28/2020   11:09 AM 07/23/2020    4:23 PM 07/23/2020    9:51 AM  Advanced Directives  Does Patient Have a Medical Advance Directive? Yes Yes Yes Yes Yes Yes Yes  Type of Estate agent of [place];Living will Healthcare Power of [place];Living will Healthcare Power of [place];Living will Healthcare Power of [place];Living will Healthcare Power of Attorney Living will;Healthcare Power of [employer] Power of [place];Living will  Does patient want to make changes to medical advance directive?       No - Patient declined No - Patient declined  Copy of Healthcare Power of Attorney in Chart? No - copy requested No - copy requested No - copy requested  No - copy requested No - copy requested No - copy requested    Current Medications (verified) Outpatient Encounter Medications as of 12/18/2023  Medication Sig   calcium carbonate (TUMS EX) 750 MG chewable tablet Chew 1 tablet by mouth daily as needed for heartburn.   Calcium-Magnesium-Zinc (CAL-MAG-ZINC PO) Take 1 tablet by mouth 2 (two) times a week.   Cholecalciferol (D3-1000) 25 MCG (1000 UT) capsule Take 1,000 Units by mouth daily.   doxycycline  (VIBRAMYCIN ) 100 MG capsule Take 1 capsule (100 mg total) by mouth 2 (two) times daily. (Patient not taking: Reported on 12/18/2023)   famotidine-calcium carbonate-magnesium hydroxide (PEPCID COMPLETE) 10-800-165 MG chewable tablet Chew 1 tablet by mouth daily as needed (acid reflux).   mesalamine  (LIALDA ) 1.2 G EC tablet Take 2.4 g by mouth 2 (two) times daily. Two tablets in the morning and Two tablets at bedtime   methylPREDNISolone  (MEDROL  DOSEPAK) 4 MG TBPK tablet Take as directed (Patient not taking: Reported on 12/18/2023)   Multiple Vitamins-Minerals (PRESERVISION AREDS 2 PO) Take 1 capsule by mouth 2 (two) times a week.   traMADol  (ULTRAM ) 50 MG tablet Take 1 tablet (50 mg total) by mouth every 6 (six) hours as needed. (Patient not taking: Reported on 12/18/2023)   No facility-administered encounter medications on file as of 12/18/2023.    Allergies (verified) Sulfonamide derivatives, Sulfa antibiotics, Nsaids,  and Penicillins   History: Past Medical History:  Diagnosis Date   Arthritis    knees, fingers, knees,   Colitis    Dysrhythmia    , WPW,SVT   GERD (gastroesophageal reflux disease)    Osteoporosis    Segmental colitis (HCC)    Ulcerative colitis    age 29   WPW (Wolff-Parkinson-White syndrome)    Past Surgical History:  Procedure Laterality Date   BREAST BIOPSY  Right 02/22/1995   CATARACT EXTRACTION W/ INTRAOCULAR LENS  IMPLANT, BILATERAL Bilateral 2-05/2011   INGUINAL HERNIA REPAIR Right ~ 1981   ORIF HUMERUS FRACTURE Left 01/11/2013   Procedure: LEFT ORIF PROXIMAL HUMERUS FRACTURE;  Surgeon: Franky CHRISTELLA Pointer, MD;  Location: [facility] OR;  Service: Orthopedics;  Laterality: Left;  SAME DAY LABS   ORIF PROXIMAL HUMERUS FRACTURE Left 01/11/2013   SVT ABLATION N/A 02/04/2021   Procedure: SVT ABLATION;  Surgeon: Cindie Ole DASEN, MD;  Location: [facility] INVASIVE CV LAB;  Service: Cardiovascular;  Laterality: N/A;   TONSILLECTOMY  1948   TOTAL KNEE ARTHROPLASTY Left 07/23/2020   Procedure: TOTAL KNEE ARTHROPLASTY;  Surgeon: Melodi Lerner, MD;  Location: [facility] ORS;  Service: Orthopedics;  Laterality: Left;    TUBAL LIGATION  ~ 1985   Family History  Problem Relation Age of Onset   Osteoporosis Mother    Prostate cancer Other    Social History   Socioeconomic History   Marital status: Married    Spouse name: Not on file   Number of children: Not on file   Years of education: Not on file   Highest education level: Not on file  Occupational History   Not on file  Tobacco Use   Smoking status: Never   Smokeless tobacco: Never  Vaping Use   Vaping status: Never Used  Substance and Sexual Activity   Alcohol use: Yes    Alcohol/week: 2.0 standard drinks of alcohol    Types: 2 Glasses of wine per week    Comment: 01/11/2013 2-3 glasses of wine/wk; some weeks not even that   Drug use: No   Sexual activity: Yes  Other Topics Concern   Not on file  Social History Narrative   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         Social Drivers of Health   Financial Resource Strain: Low Risk  (12/18/2023)   Overall Financial Resource Strain (CARDIA)    Difficulty of Paying Living Expenses: Not hard at all  Food Insecurity: No Food Insecurity (12/18/2023)   Hunger Vital Sign    Worried About Running Out of Food in the Last Year: Never true    Ran Out of Food in  the Last Year: Never true  Transportation Needs: No Transportation Needs (12/18/2023)   PRAPARE - Administrator, Civil Service (Medical): No    Lack of Transportation (Non-Medical): No  Physical Activity: Sufficiently Active (12/18/2023)   Exercise Vital Sign    Days of Exercise per Week: 6 days    Minutes of Exercise per Session: 30 min  Stress: No Stress Concern Present (12/18/2023)   [employer] of Occupational Health - Occupational Stress Questionnaire    Feeling of Stress: Not at all  Social Connections: Socially Integrated (12/18/2023)   Social Connection and Isolation Panel    Frequency of Communication with Friends and Family: More than three times a week    Frequency of Social Gatherings with Friends and Family: More than three times a week  Attends Religious Services: More than 4 times per year    Active Member of Clubs or Organizations: Yes    Attends Banker Meetings: More than 4 times per year    Marital Status: Married    Tobacco Counseling Counseling given: Not Answered    Clinical Intake:  Pre-visit preparation completed: Yes  Pain : No/denies pain     BMI - recorded: 19.86 Nutritional Status: BMI of 19-24  Normal Nutritional Risks: None Diabetes: No  Lab Results  Component Value Date   HGBA1C 5.5 10/04/2020     How often do you need to have someone help you when you read instructions, pamphlets, or other written materials from your doctor or pharmacy?: 1 - Never  Interpreter Needed?: No  Information entered by :: Rojelio Blush LPN   Activities of Daily Living     12/18/2023   10:49 AM  In your present state of health, do you have any difficulty performing the following activities:  Hearing? 0  Vision? 0  Difficulty concentrating or making decisions? 0  Walking or climbing stairs? 0  Dressing or bathing? 0  Doing errands, shopping? 0  Preparing Food and eating ? N  Using the Toilet? N  In the past six  months, have you accidently leaked urine? N  Do you have problems with loss of bowel control? N  Managing your Medications? N  Managing your Finances? N  Housekeeping or managing your Housekeeping? N    Patient Care Team: Micheal Wolm ORN, MD as PCP - General Cindie Ole DASEN, MD as PCP - Electrophysiology (Cardiology) Charmayne Molly, MD as Consulting Physician (Ophthalmology)  I have updated your Care Teams any recent Medical Services you may have received from other providers in the past year.     Assessment:   This is a routine wellness examination for Danielly.  Hearing/Vision screen Hearing Screening - Comments:: Denies hearing difficulties   Vision Screening - Comments:: Wears rx glasses - up to date with routine eye exams with  [facility]   Goals Addressed               This Visit's Progress     Continue physical activity (pt-stated)        Remain active       Depression Screen     12/18/2023   10:48 AM 12/15/2022   10:26 AM 12/25/2021    3:44 PM 12/11/2021   10:48 AM 11/28/2020   11:07 AM 11/23/2019    2:08 PM 04/27/2019   10:14 AM  PHQ 2/9 Scores  PHQ - 2 Score 0 0 0 0 0 0 0  PHQ- 9 Score   0   0 0    Fall Risk     12/18/2023   10:49 AM 12/15/2022   10:27 AM 01/31/2022    8:04 AM 12/25/2021    3:45 PM 12/11/2021   10:50 AM  Fall Risk   Falls in the past year? 0 0 0 0 0  Number falls in past yr: 0 0 0 0 0  Injury with Fall? 0 0 0 0 0  Risk for fall due to : No Fall Risks No Fall Risks No Fall Risks No Fall Risks No Fall Risks  Follow up Falls evaluation completed Falls prevention discussed Falls evaluation completed  Falls evaluation completed  Falls prevention discussed      Data saved with a previous flowsheet row definition    MEDICARE RISK AT HOME:  Medicare Risk at  Home Any stairs in or around the home?: Yes If so, are there any without handrails?: No Home free of loose throw rugs in walkways, pet beds, electrical cords, etc?: Yes Adequate  lighting in your home to reduce risk of falls?: Yes Life alert?: No Use of a cane, walker or w/c?: No Grab bars in the bathroom?: Yes Shower chair or bench in shower?: No Elevated toilet seat or a handicapped toilet?: No  TIMED UP AND GO:  Was the test performed?  No  Cognitive Function: 6CIT completed        12/18/2023   10:50 AM 12/15/2022   10:29 AM 12/11/2021   10:52 AM 11/28/2020   11:15 AM  6CIT Screen  What Year? 0 points 0 points 0 points 0 points  What month? 0 points 0 points 0 points 0 points  What time? 0 points 0 points 0 points 0 points  Count back from 20 0 points 0 points 0 points 0 points  Months in reverse 0 points 0 points 0 points 0 points  Repeat phrase 0 points 0 points 0 points 0 points  Total Score 0 points 0 points 0 points 0 points    Immunizations Immunization History  Administered Date(s) Administered   Fluad Trivalent(High Dose 65+) 01/02/2023   INFLUENZA, HIGH DOSE SEASONAL PF 02/07/2016, 01/22/2017, 01/08/2018, 01/13/2022   Influenza Split 01/23/2011   Influenza Whole 12/22/2009, 01/03/2014   Influenza-Unspecified 01/05/2013, 12/23/2014, 01/07/2019, 01/04/2021   PFIZER(Purple Top)SARS-COV-2 Vaccination 04/13/2019, 05/04/2019, 12/23/2019, 08/16/2020   Pneumococcal Conjugate-13 12/09/2013   Pneumococcal Polysaccharide-23 06/28/2008   Tdap 12/09/2013   Zoster, Live 03/25/2007    Screening Tests Health Maintenance  Topic Date Due   Zoster Vaccines- Shingrix (1 of 2) 05/25/1962   Influenza Vaccine  10/23/2023   COVID-19 Vaccine (5 - 2025-26 season) 11/23/2023   DTaP/Tdap/Td (2 - Td or Tdap) 12/10/2023   Medicare Annual Wellness (AWV)  12/17/2024   Pneumococcal Vaccine: 50+ Years  Completed   DEXA SCAN  Completed   HPV VACCINES  Aged Out   Meningococcal B Vaccine  Aged Out   Colonoscopy  Discontinued    Health Maintenance Items Addressed:   Additional Screening:  Vision Screening: Recommended annual ophthalmology exams for early  detection of glaucoma and other disorders of the eye. Is the patient up to date with their annual eye exam?  Yes  Who is the provider or what is the name of the office in which the patient attends annual eye exams? [facility]  Dental Screening: Recommended annual dental exams for proper oral hygiene  Community Resource Referral / Chronic Care Management: CRR required this visit?  No   CCM required this visit?  No   Plan:    I have personally reviewed and noted the following in the patient's chart:   Medical and social history Use of alcohol, tobacco or illicit drugs  Current medications and supplements including opioid prescriptions. Patient is not currently taking opioid prescriptions. Functional ability and status Nutritional status Physical activity Advanced directives List of other physicians Hospitalizations, surgeries, and ER visits in previous 12 months Vitals Screenings to include cognitive, depression, and falls Referrals and appointments  In addition, I have reviewed and discussed with patient certain preventive protocols, quality metrics, and best practice recommendations. A written personalized care plan for preventive services as well as general preventive health recommendations were provided to patient.   Rojelio LELON Blush, LPN   [id number]   After Visit Summary: (MyChart) Due to this being a telephonic  visit, the after visit summary with patients personalized plan was offered to patient via MyChart   Notes: Nothing significant to report at this time.

## 2024-01-15 ENCOUNTER — Ambulatory Visit: Admitting: Family Medicine

## 2024-01-15 ENCOUNTER — Ambulatory Visit: Payer: Self-pay | Admitting: Family Medicine

## 2024-01-15 ENCOUNTER — Encounter: Payer: Self-pay | Admitting: Family Medicine

## 2024-01-15 VITALS — BP 142/68 | HR 68 | Temp 97.5°F | Ht 60.0 in | Wt 103.3 lb

## 2024-01-15 DIAGNOSIS — E785 Hyperlipidemia, unspecified: Secondary | ICD-10-CM

## 2024-01-15 DIAGNOSIS — R7989 Other specified abnormal findings of blood chemistry: Secondary | ICD-10-CM

## 2024-01-15 DIAGNOSIS — Z0000 Encounter for general adult medical examination without abnormal findings: Secondary | ICD-10-CM | POA: Diagnosis not present

## 2024-01-15 LAB — LIPID PANEL
Cholesterol: 187 mg/dL (ref 0–200)
HDL: 49.3 mg/dL
LDL Cholesterol: 113 mg/dL — ABNORMAL HIGH (ref 0–99)
NonHDL: 137.53
Total CHOL/HDL Ratio: 4
Triglycerides: 124 mg/dL (ref 0.0–149.0)
VLDL: 24.8 mg/dL (ref 0.0–40.0)

## 2024-01-15 LAB — HEPATIC FUNCTION PANEL
ALT: 14 U/L (ref 0–35)
AST: 19 U/L (ref 0–37)
Albumin: 4.2 g/dL (ref 3.5–5.2)
Alkaline Phosphatase: 77 U/L (ref 39–117)
Bilirubin, Direct: 0.1 mg/dL (ref 0.0–0.3)
Total Bilirubin: 0.7 mg/dL (ref 0.2–1.2)
Total Protein: 6.6 g/dL (ref 6.0–8.3)

## 2024-01-15 LAB — CBC WITH DIFFERENTIAL/PLATELET
Basophils Absolute: 0 K/uL (ref 0.0–0.1)
Basophils Relative: 1.1 % (ref 0.0–3.0)
Eosinophils Absolute: 0.1 K/uL (ref 0.0–0.7)
Eosinophils Relative: 3.3 % (ref 0.0–5.0)
HCT: 41.9 % (ref 36.0–46.0)
Hemoglobin: 13.7 g/dL (ref 12.0–15.0)
Lymphocytes Relative: 24.5 % (ref 12.0–46.0)
Lymphs Abs: 1 K/uL (ref 0.7–4.0)
MCHC: 32.7 g/dL (ref 30.0–36.0)
MCV: 86 fl (ref 78.0–100.0)
Monocytes Absolute: 0.4 K/uL (ref 0.1–1.0)
Monocytes Relative: 10.1 % (ref 3.0–12.0)
Neutro Abs: 2.5 K/uL (ref 1.4–7.7)
Neutrophils Relative %: 61 % (ref 43.0–77.0)
Platelets: 235 K/uL (ref 150.0–400.0)
RBC: 4.88 Mil/uL (ref 3.87–5.11)
RDW: 15.5 % (ref 11.5–15.5)
WBC: 4.1 K/uL (ref 4.0–10.5)

## 2024-01-15 LAB — BASIC METABOLIC PANEL WITH GFR
BUN: 17 mg/dL (ref 6–23)
CO2: 25 meq/L (ref 19–32)
Calcium: 9.6 mg/dL (ref 8.4–10.5)
Chloride: 108 meq/L (ref 96–112)
Creatinine, Ser: 0.74 mg/dL (ref 0.40–1.20)
GFR: 76.29 mL/min
Glucose, Bld: 93 mg/dL (ref 70–99)
Potassium: 4.4 meq/L (ref 3.5–5.1)
Sodium: 141 meq/L (ref 135–145)

## 2024-01-15 LAB — TSH: TSH: 3.59 u[IU]/mL (ref 0.35–5.50)

## 2024-01-15 LAB — T4, FREE: Free T4: 0.85 ng/dL (ref 0.60–1.60)

## 2024-01-15 LAB — HEMOGLOBIN A1C: Hgb A1c MFr Bld: 5.8 % (ref 4.6–6.5)

## 2024-01-15 NOTE — Progress Notes (Signed)
 Established Patient Office Visit  Subjective   Patient ID: Caitlin Hicks, female    DOB: 02-03-1946  Age: 78 y.o. MRN: [id number]  Chief Complaint  Patient presents with   Annual Exam    HPI   Caitlin Hicks is seen today for physical exam.  She has history of Wolff-Parkinson-White syndrome, ulcerative colitis, osteoarthritis, osteoporosis, history of pseudogout, intermittent GERD.  Generally doing well.  She did have recent fall at home.  One of her rugs had gotten rolled up and she tripped over that.  She generally has excellent balance.  She tripped and fell a week ago striking her right forehead against a marble floor.  No loss of consciousness.  Has had no headaches or dizziness.  No cognitive changes.  Also had abrasion right elbow and bruising right arm but no suspicion for fracture.    She has had mild hyperlipidemia the past and requesting repeat lipids.  Also history of elevated TSH.  Appetite and weight stable.  Health maintenance reviewed:  Health Maintenance  Topic Date Due   Zoster Vaccines- Shingrix (1 of 2) 05/25/1962   COVID-19 Vaccine (5 - 2025-26 season) 11/23/2023   DTaP/Tdap/Td (2 - Td or Tdap) 12/10/2023   Medicare Annual Wellness (AWV)  12/17/2024   Pneumococcal Vaccine: 50+ Years  Completed   Influenza Vaccine  Completed   DEXA SCAN  Completed   Meningococcal B Vaccine  Aged Out   Colonoscopy  Discontinued   - She has had regular colonoscopies per GI but aged out of further screening -Needs RSV vaccine, otherwise vaccines up-to-date  Family history-her father died age 50 possibly complications of stroke.  He did have history of prostate cancer.  Her mother died around age 80 late onset dementia.  She has 1 brother without active medical problems  Social history-married.  Retired orthoptist.  She has 2 children.  1 is a sports administrator and the other is a clinical research associate.  She has never smoked.  Occasional glass of wine but no regular alcohol use.  Past Medical History:   Diagnosis Date   Arthritis    knees, fingers, knees,   Colitis    Dysrhythmia    , WPW,SVT   GERD (gastroesophageal reflux disease)    Osteoporosis    Segmental colitis (HCC)    Ulcerative colitis    age 54   WPW (Wolff-Parkinson-White syndrome)    Past Surgical History:  Procedure Laterality Date   BREAST BIOPSY Right 02/22/1995   CATARACT EXTRACTION W/ INTRAOCULAR LENS  IMPLANT, BILATERAL Bilateral 2-05/2011   INGUINAL HERNIA REPAIR Right ~ 1981   ORIF HUMERUS FRACTURE Left 01/11/2013   Procedure: LEFT ORIF PROXIMAL HUMERUS FRACTURE;  Surgeon: Franky CHRISTELLA Pointer, MD;  Location: [facility] OR;  Service: Orthopedics;  Laterality: Left;  SAME DAY LABS   ORIF PROXIMAL HUMERUS FRACTURE Left 01/11/2013   SVT ABLATION N/A 02/04/2021   Procedure: SVT ABLATION;  Surgeon: Cindie Ole DASEN, MD;  [facility]: [facility] INV[facility] CV LAB;  Service: Cardiovascular;  Laterality: N/A;   TONSILLECTOMY  1948   TOTAL KNEE ARTHROPLASTY Left 07/23/2020   Procedure: TOTAL KNEE ARTHROPLASTY;  Surgeon: Melodi Lerner, MD;  Location: [facility] ORS;  Service: Orthopedics;  Laterality: Left;    TUBAL LIGATION  ~ 1985    reports that she has never smoked. She has never used smokeless tobacco. She reports current alcohol use of about 2.0 standard drinks of alcohol per week. She reports that she does not use drugs. family history includes Osteoporosis in her  mother; Prostate cancer in an other family member. Allergies  Allergen Reactions   Sulfonamide Derivatives Rash   Sulfa Antibiotics Rash   Nsaids Other (See Comments)    Contraindicated with lialda    Penicillins Rash    Tolerated Cephalosporin Date: 07/24/20.        Review of Systems  Constitutional:  Negative for chills, fever, malaise/fatigue and weight loss.  HENT:  Negative for hearing loss.   Eyes:  Negative for blurred vision and double vision.  Respiratory:  Negative for cough and shortness of breath.   Cardiovascular:  Negative for chest pain, palpitations  and leg swelling.  Gastrointestinal:  Negative for abdominal pain, blood in stool, constipation and diarrhea.  Genitourinary:  Negative for dysuria.  Skin:  Negative for rash.  Neurological:  Negative for dizziness, speech change, seizures, loss of consciousness and headaches.  Psychiatric/Behavioral:  Negative for depression.       Objective:     BP (!) 142/68 (BP Location: Left Arm, Cuff Size: Normal)   Pulse 68   Temp (!) 97.5 F (36.4 C) (Oral)   Ht 5' (1.524 m)   Wt 103 lb 4.8 oz (46.9 kg)   SpO2 99%   BMI 20.17 kg/m  BP Readings from Last 3 Encounters:  01/15/24 (!) 142/68  09/21/23 136/74  01/22/23 128/70   Wt Readings from Last 3 Encounters:  01/15/24 103 lb 4.8 oz (46.9 kg)  12/18/23 100 lb (45.4 kg)  09/21/23 100 lb (45.4 kg)      Physical Exam Vitals reviewed.  Constitutional:      Appearance: She is well-developed.  HENT:     Head: Normocephalic.     Comments: She has small bruise right superior orbital region and right forehead.  No hematoma. Eyes:     Pupils: Pupils are equal, round, and reactive to light.  Neck:     Thyroid : No thyromegaly.  Cardiovascular:     Rate and Rhythm: Normal rate and regular rhythm.     Heart sounds: Normal heart sounds. No murmur heard. Pulmonary:     Effort: No respiratory distress.     Breath sounds: Normal breath sounds. No wheezing or rales.  Abdominal:     General: Bowel sounds are normal. There is no distension.     Palpations: Abdomen is soft. There is no mass.     Tenderness: There is no abdominal tenderness. There is no guarding or rebound.  Musculoskeletal:        General: Normal range of motion.     Cervical back: Normal range of motion and neck supple.     Comments: She has fairly extensive bruising right arm.  Superficial abrasion right lateral elbow.  No cellulitis changes.  Lymphadenopathy:     Cervical: No cervical adenopathy.  Skin:    Findings: No rash.  Neurological:     Mental Status: She is  alert and oriented to person, place, and time.     Cranial Nerves: No cranial nerve deficit.     Deep Tendon Reflexes: Reflexes normal.  Psychiatric:        Behavior: Behavior normal.        Thought Content: Thought content normal.        Judgment: Judgment normal.      No results found for any visits on 01/15/24.    The ASCVD Risk score (Arnett DK, et al., 2019) failed to calculate for the following reasons:   The 2019 ASCVD risk score is only valid for ages 33  to 59    Assessment & Plan:   Problem List Items Addressed This Visit   None Visit Diagnoses       Physical exam    -  Primary   Relevant Orders   Basic metabolic panel with GFR   CBC with Differential/Platelet   Hemoglobin A1c     Dyslipidemia       Relevant Orders   Lipid panel   Hepatic function panel     Elevated TSH       Relevant Orders   TSH   T4, Free     - Very health-conscious 78 year old female.  She had prior history of with Parkinson White syndrome and has done well following ablation.  -Obtain labs as above  -Did suggest RSV vaccine.  Flu vaccine already given for the season.  Other vaccines up-to-date.  No follow-ups on file.    Wolm Scarlet, MD

## 2024-01-15 NOTE — Patient Instructions (Signed)
Monitor blood pressure and be in touch if consistently > 122 systolic.

## 2024-03-31 ENCOUNTER — Ambulatory Visit: Admitting: Podiatry

## 2024-03-31 ENCOUNTER — Encounter: Payer: Self-pay | Admitting: Podiatry

## 2024-03-31 DIAGNOSIS — L84 Corns and callosities: Secondary | ICD-10-CM

## 2024-03-31 DIAGNOSIS — M2041 Other hammer toe(s) (acquired), right foot: Secondary | ICD-10-CM | POA: Diagnosis not present

## 2024-04-01 ENCOUNTER — Encounter (INDEPENDENT_AMBULATORY_CARE_PROVIDER_SITE_OTHER): Payer: Self-pay

## 2024-04-01 ENCOUNTER — Encounter: Payer: Self-pay | Admitting: Family Medicine

## 2024-04-01 NOTE — Progress Notes (Signed)
 Subjective:   Patient ID: Caitlin Hicks, female   DOB: 79 y.o.   MRN: [id number]   HPI Patient presents with a painful hammertoe second digit right that had done well but is now hurting again and she is not sure about surgery   ROS      Objective:  Physical Exam  Neurovascular status intact muscle strength adequate range of motion adequate with severe deformity distal second digit right with contracture at the distal joint that is rigid and distal keratotic tissue that forms     Assessment:  Hammertoe deformity second digit right distal joint with lesion     Plan:  H&P reviewed we did discuss distal arthroplasty and that it may be something we will gena have to do for her but at this point organ to continue conservative and I went ahead today did courtesy debridement pad of the area and again educated her on the recovery from distal arthroplasty procedure that may be necessary.

## 2024-12-26 ENCOUNTER — Ambulatory Visit
